# Patient Record
Sex: Male | Born: 1990
Health system: Southern US, Community
[De-identification: ages and names within clinical notes are randomized; demographics above are authoritative.]

## PROBLEM LIST (undated history)

## (undated) DIAGNOSIS — R569 Unspecified convulsions: Secondary | ICD-10-CM

## (undated) DIAGNOSIS — R51 Headache: Secondary | ICD-10-CM

## (undated) DIAGNOSIS — R519 Headache, unspecified: Secondary | ICD-10-CM

## (undated) DIAGNOSIS — I4892 Unspecified atrial flutter: Secondary | ICD-10-CM

## (undated) DIAGNOSIS — G809 Cerebral palsy, unspecified: Secondary | ICD-10-CM

## (undated) DIAGNOSIS — G930 Cerebral cysts: Secondary | ICD-10-CM

## (undated) DIAGNOSIS — G40909 Epilepsy, unspecified, not intractable, without status epilepticus: Secondary | ICD-10-CM

## (undated) HISTORY — DX: Headache, unspecified: R51.9

## (undated) HISTORY — PX: OTHER SURGICAL HISTORY: SHX169

## (undated) HISTORY — PX: TONSILLECTOMY AND ADENOIDECTOMY: SHX28

## (undated) HISTORY — DX: Headache: R51

---
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
## 2011-01-14 ENCOUNTER — Emergency Department (HOSPITAL_COMMUNITY)
Admission: EM | Admit: 2011-01-14 | Discharge: 2011-01-14 | Disposition: A | Discharge status: Home / Self Care | Payer: 59 | Attending: Emergency Medicine | Admitting: Emergency Medicine

## 2011-01-14 ENCOUNTER — Emergency Department (HOSPITAL_COMMUNITY): Payer: 59

## 2011-01-14 DIAGNOSIS — G809 Cerebral palsy, unspecified: Secondary | ICD-10-CM | POA: Insufficient documentation

## 2011-01-14 DIAGNOSIS — IMO0001 Reserved for inherently not codable concepts without codable children: Secondary | ICD-10-CM | POA: Insufficient documentation

## 2011-01-14 DIAGNOSIS — J029 Acute pharyngitis, unspecified: Secondary | ICD-10-CM | POA: Insufficient documentation

## 2011-01-14 DIAGNOSIS — R059 Cough, unspecified: Secondary | ICD-10-CM | POA: Insufficient documentation

## 2011-01-14 DIAGNOSIS — R05 Cough: Secondary | ICD-10-CM | POA: Insufficient documentation

## 2011-01-14 DIAGNOSIS — R569 Unspecified convulsions: Secondary | ICD-10-CM | POA: Insufficient documentation

## 2011-01-14 DIAGNOSIS — R6889 Other general symptoms and signs: Secondary | ICD-10-CM | POA: Insufficient documentation

## 2011-01-14 DIAGNOSIS — J069 Acute upper respiratory infection, unspecified: Secondary | ICD-10-CM | POA: Insufficient documentation

## 2011-01-14 LAB — RAPID STREP SCREEN (MED CTR MEBANE ONLY): Streptococcus, Group A Screen (Direct): NEGATIVE

## 2011-07-27 ENCOUNTER — Encounter: Payer: Self-pay | Admitting: Emergency Medicine

## 2011-07-27 ENCOUNTER — Emergency Department (HOSPITAL_COMMUNITY)
Admission: EM | Admit: 2011-07-27 | Discharge: 2011-07-27 | Disposition: A | Discharge status: Home / Self Care | Payer: 59 | Attending: Emergency Medicine | Admitting: Emergency Medicine

## 2011-07-27 DIAGNOSIS — Z79899 Other long term (current) drug therapy: Secondary | ICD-10-CM | POA: Insufficient documentation

## 2011-07-27 DIAGNOSIS — R569 Unspecified convulsions: Secondary | ICD-10-CM

## 2011-07-27 DIAGNOSIS — G40909 Epilepsy, unspecified, not intractable, without status epilepticus: Secondary | ICD-10-CM | POA: Insufficient documentation

## 2011-07-27 HISTORY — DX: Unspecified convulsions: R56.9

## 2011-07-27 LAB — URINALYSIS, ROUTINE W REFLEX MICROSCOPIC
Bilirubin Urine: NEGATIVE
Glucose, UA: NEGATIVE mg/dL
Specific Gravity, Urine: 1.018 (ref 1.005–1.030)
pH: 5 (ref 5.0–8.0)

## 2011-07-27 LAB — BASIC METABOLIC PANEL
BUN: 15 mg/dL (ref 6–23)
Chloride: 104 mEq/L (ref 96–112)
Glucose, Bld: 111 mg/dL — ABNORMAL HIGH (ref 70–99)
Potassium: 4.3 mEq/L (ref 3.5–5.1)

## 2011-07-27 LAB — CBC
HCT: 41.1 % (ref 39.0–52.0)
Hemoglobin: 14.7 g/dL (ref 13.0–17.0)
MCHC: 35.8 g/dL (ref 30.0–36.0)
WBC: 10.8 10*3/uL — ABNORMAL HIGH (ref 4.0–10.5)

## 2011-07-27 LAB — URINE MICROSCOPIC-ADD ON

## 2011-07-27 MED ORDER — ACETAMINOPHEN 325 MG PO TABS
650.0000 mg | ORAL_TABLET | Freq: Once | ORAL | Status: AC
  Administered 2011-07-27: 650 mg via ORAL
  Filled 2011-07-27: qty 2

## 2011-07-27 NOTE — ED Notes (Signed)
Per EMS pt had 2 witnessed seizures today per family. Pt alert to verbal stimuli, but falls back to sleep fast. Pt has hx of seizures. Pt was in bed when seizures took place. No signs of injury in mouth.

## 2011-07-27 NOTE — ED Provider Notes (Signed)
History     CSN: [id number] Arrival date & time: 07/27/2011  5:12 PM   First MD Initiated Contact with Patient 07/27/11 1719      Chief Complaint  Patient presents with  . Seizures    (Consider location/radiation/quality/duration/timing/severity/associated sxs/prior treatment) HPI History provided by pt's mother and girlfriend.   Patient's mother reports that he has h/o generalized tonic-clonic seizures for which he takes tegretol and keppra.  His girlfriend says he has been compliant w/ medication.  She witnessed a 5-58min seizure this afternoon.  Had another one w/in a 1/2 hr of first.  Has been post-ictal ever since which is typical for him.  No recent head trauma.  No recent illnesses.  Neurologist is located in [place] and his mother would like him to see a neurologist in [place].   Past Medical History  Diagnosis Date  . Seizures     History reviewed. No pertinent past surgical history.  History reviewed. No pertinent family history.  History  Substance Use Topics  . Smoking status: Not on file  . Smokeless tobacco: Not on file  . Alcohol Use: No      Review of Systems  All other systems reviewed and are negative.    Allergies  Review of patient's allergies indicates no known allergies.  Home Medications   Current Outpatient Rx  Name Route Sig Dispense Refill  . CARBAMAZEPINE ER 200 MG PO CP12 Oral Take 400 mg by mouth 2 (two) times daily.      Marland Kitchen LEVETIRACETAM 1000 MG PO TABS Oral Take 1,000 mg by mouth 2 (two) times daily.        BP 121/55  Pulse 95  Temp(Src) 98.3 F (36.8 C) (Oral)  Resp 20  SpO2 100%  Physical Exam  Nursing note and vitals reviewed. Constitutional: He appears well-developed and well-nourished. No distress.  HENT:  Head: Normocephalic and atraumatic.  Eyes:       Normal appearance  Neck: Normal range of motion.  Cardiovascular: Normal rate and regular rhythm.   Pulmonary/Chest: Effort normal and breath sounds normal.    Musculoskeletal: Normal range of motion.  Neurological: He is alert. He has normal reflexes. No cranial nerve deficit or sensory deficit. He displays a negative Romberg sign. Coordination and gait normal.       Drowsy.  Oriented to person and time. 5/5 and equal upper and lower extremity strength.  No past pointing.  No pronator drift.    Skin: Skin is warm and dry. No rash noted.  Psychiatric: He has a normal mood and affect. His behavior is normal.    ED Course  Procedures (including critical care time)  Labs Reviewed  CBC - Abnormal; Notable for the following:    WBC 10.8 (*)    All other components within normal limits  BASIC METABOLIC PANEL - Abnormal; Notable for the following:    Glucose, Bld 111 (*)    GFR calc non Af Amer 86 (*)    All other components within normal limits  URINALYSIS, ROUTINE W REFLEX MICROSCOPIC - Abnormal; Notable for the following:    Hgb urine dipstick SMALL (*)    Protein, ur 30 (*)    All other components within normal limits  URINE MICROSCOPIC-ADD ON - Abnormal; Notable for the following:    Bacteria, UA FEW (*)    All other components within normal limits  CARBAMAZEPINE LEVEL, TOTAL   No results found.   1. Seizure       MDM  Pt w/ h/o seizure disorder presents w/ seizure.  Compliant w/ meds.  Pt drowsy but no focal neuro deficits on exam.  Labs unremarkable w/ exception of tegretol level which is still pending and has not yet been transferred to [facility].  Pt more awake now and has no complaints.  Discharged home w/ referral to [facility].  Will contact him if Tegretol level abnormal.   Tegretol level low.  Flow manager has agreed to call pt to notify him and advise him to follow up with his neurologist asap.        Otilio Miu, [place] 07/28/11 1040

## 2011-07-27 NOTE — ED Notes (Signed)
ZOX:[id number]<AV> Expected date:07/27/11<BR> Expected time: 4:51 PM<BR> Means of arrival:Ambulance<BR> Comments:<BR> GC M62. 20 YO M. SZ. HX OF SAME. ALERT TO VERBAL RIGHT NOW. 10 MIN ETA.

## 2011-07-28 NOTE — ED Notes (Signed)
Per Kyung Bacca PA, contact patient regarding low Tegretol level 1.9 and have patient follow-up with current neurologist today. Three unsuccessful attempts to contact patient by phone today.

## 2011-07-30 NOTE — ED Provider Notes (Signed)
Medical screening examination/treatment/procedure(s) were conducted as a shared visit with non-physician practitioner(s) and myself.  I personally evaluated the patient during the encounter.  Patient has long-standing seizure disorder. Another seizure today. Tegretol level low.  Neuro exam normal  Donnetta Hutching, MD 07/30/11 1450

## 2011-09-22 ENCOUNTER — Other Ambulatory Visit: Payer: Self-pay

## 2011-09-22 ENCOUNTER — Inpatient Hospital Stay (HOSPITAL_COMMUNITY): Payer: 59

## 2011-09-22 ENCOUNTER — Encounter (HOSPITAL_COMMUNITY): Payer: Self-pay | Admitting: Nurse Practitioner

## 2011-09-22 ENCOUNTER — Inpatient Hospital Stay (HOSPITAL_COMMUNITY)
Admission: EM | Admit: 2011-09-22 | Discharge: 2011-09-24 | DRG: 310 | Disposition: A | Discharge status: Home / Self Care | Payer: 59 | Source: Ambulatory Visit | Attending: Internal Medicine | Admitting: Internal Medicine

## 2011-09-22 DIAGNOSIS — I4892 Unspecified atrial flutter: Principal | ICD-10-CM

## 2011-09-22 DIAGNOSIS — R569 Unspecified convulsions: Secondary | ICD-10-CM

## 2011-09-22 DIAGNOSIS — G40909 Epilepsy, unspecified, not intractable, without status epilepticus: Secondary | ICD-10-CM

## 2011-09-22 DIAGNOSIS — G930 Cerebral cysts: Secondary | ICD-10-CM | POA: Diagnosis present

## 2011-09-22 DIAGNOSIS — Z8679 Personal history of other diseases of the circulatory system: Secondary | ICD-10-CM | POA: Diagnosis present

## 2011-09-22 HISTORY — DX: Cerebral palsy, unspecified: G80.9

## 2011-09-22 HISTORY — DX: Cerebral cysts: G93.0

## 2011-09-22 HISTORY — DX: Unspecified atrial flutter: I48.92

## 2011-09-22 LAB — BASIC METABOLIC PANEL
CO2: 24 mEq/L (ref 19–32)
Calcium: 9.4 mg/dL (ref 8.4–10.5)
Chloride: 103 mEq/L (ref 96–112)
Glucose, Bld: 60 mg/dL — ABNORMAL LOW (ref 70–99)
Potassium: 4.3 mEq/L (ref 3.5–5.1)
Sodium: 137 mEq/L (ref 135–145)

## 2011-09-22 LAB — URINE MICROSCOPIC-ADD ON

## 2011-09-22 LAB — CBC
Hemoglobin: 15.2 g/dL (ref 13.0–17.0)
MCHC: 35.8 g/dL (ref 30.0–36.0)
RBC: 4.72 MIL/uL (ref 4.22–5.81)

## 2011-09-22 LAB — GLUCOSE, CAPILLARY
Glucose-Capillary: 102 mg/dL — ABNORMAL HIGH (ref 70–99)
Glucose-Capillary: 84 mg/dL (ref 70–99)
Glucose-Capillary: 95 mg/dL (ref 70–99)

## 2011-09-22 LAB — URINALYSIS, ROUTINE W REFLEX MICROSCOPIC
Glucose, UA: NEGATIVE mg/dL
Ketones, ur: 15 mg/dL — AB
Nitrite: NEGATIVE
Protein, ur: NEGATIVE mg/dL
pH: 6 (ref 5.0–8.0)

## 2011-09-22 LAB — TSH: TSH: 1.167 u[IU]/mL (ref 0.350–4.500)

## 2011-09-22 LAB — ETHANOL: Alcohol, Ethyl (B): <11 mg/dL (ref 0–11)

## 2011-09-22 LAB — DIFFERENTIAL
Basophils Absolute: 0 10*3/uL (ref 0.0–0.1)
Lymphocytes Relative: 9 % — ABNORMAL LOW (ref 12–46)
Lymphs Abs: 0.9 10*3/uL (ref 0.7–4.0)
Neutro Abs: 8.5 10*3/uL — ABNORMAL HIGH (ref 1.7–7.7)
Neutrophils Relative %: 82 % — ABNORMAL HIGH (ref 43–77)

## 2011-09-22 LAB — CARBAMAZEPINE LEVEL, TOTAL: Carbamazepine Lvl: <0.5 ug/mL — ABNORMAL LOW (ref 4.0–12.0)

## 2011-09-22 MED ORDER — ACETAMINOPHEN 325 MG PO TABS
650.0000 mg | ORAL_TABLET | Freq: Once | ORAL | Status: AC
  Administered 2011-09-22: 650 mg via ORAL
  Filled 2011-09-22: qty 2

## 2011-09-22 MED ORDER — IBUPROFEN 800 MG PO TABS
800.0000 mg | ORAL_TABLET | Freq: Once | ORAL | Status: DC
  Filled 2011-09-22: qty 1

## 2011-09-22 MED ORDER — INFLUENZA VIRUS VACC SPLIT PF IM SUSP
0.5000 mL | INTRAMUSCULAR | Status: AC
  Administered 2011-09-23: 0.5 mL via INTRAMUSCULAR
  Filled 2011-09-22: qty 0.5

## 2011-09-22 MED ORDER — LORAZEPAM 2 MG/ML IJ SOLN
1.0000 mg | Freq: Once | INTRAMUSCULAR | Status: AC
  Administered 2011-09-22: 1 mg via INTRAVENOUS
  Filled 2011-09-22: qty 1

## 2011-09-22 MED ORDER — LEVETIRACETAM 500 MG PO TABS
1000.0000 mg | ORAL_TABLET | ORAL | Status: AC
  Administered 2011-09-22: 1000 mg via ORAL
  Filled 2011-09-22: qty 2

## 2011-09-22 MED ORDER — OXYCODONE-ACETAMINOPHEN 5-325 MG PO TABS: 1.0000 | ORAL_TABLET | Freq: Once | ORAL | Status: DC

## 2011-09-22 MED ORDER — CARBAMAZEPINE ER 200 MG PO TB12
400.0000 mg | ORAL_TABLET | Freq: Two times a day (BID) | ORAL | Status: DC
  Administered 2011-09-22 – 2011-09-24 (×4): 400 mg via ORAL
  Filled 2011-09-22 (×7): qty 2

## 2011-09-22 MED ORDER — LEVETIRACETAM 500 MG PO TABS
1000.0000 mg | ORAL_TABLET | Freq: Two times a day (BID) | ORAL | Status: DC
  Administered 2011-09-22 – 2011-09-24 (×4): 1000 mg via ORAL
  Filled 2011-09-22 (×6): qty 2

## 2011-09-22 MED ORDER — DILTIAZEM HCL 25 MG/5ML IV SOLN: 10.0000 mg | Freq: Once | INTRAVENOUS | Status: DC

## 2011-09-22 MED ORDER — ACETAMINOPHEN 325 MG PO TABS: 650.0000 mg | ORAL_TABLET | Freq: Four times a day (QID) | ORAL | Status: DC | PRN

## 2011-09-22 MED ORDER — SODIUM CHLORIDE 0.9 % IJ SOLN
3.0000 mL | Freq: Two times a day (BID) | INTRAMUSCULAR | Status: DC
  Administered 2011-09-22: 3 mL via INTRAVENOUS

## 2011-09-22 MED ORDER — HEPARIN SODIUM (PORCINE) 5000 UNIT/ML IJ SOLN
5000.0000 [IU] | Freq: Three times a day (TID) | INTRAMUSCULAR | Status: DC
  Administered 2011-09-22 – 2011-09-23 (×3): 5000 [IU] via SUBCUTANEOUS
  Filled 2011-09-22 (×10): qty 1

## 2011-09-22 MED ORDER — DEXTROSE 50 % IV SOLN: 25.0000 mL | INTRAVENOUS | Status: AC

## 2011-09-22 MED ORDER — DILTIAZEM HCL 100 MG IV SOLR
5.0000 mg/h | Freq: Once | INTRAVENOUS | Status: DC
  Filled 2011-09-22: qty 100

## 2011-09-22 MED ORDER — CARBAMAZEPINE 200 MG PO TABS
200.0000 mg | ORAL_TABLET | ORAL | Status: AC
  Administered 2011-09-22: 200 mg via ORAL
  Filled 2011-09-22: qty 1

## 2011-09-22 NOTE — ED Notes (Signed)
Admitting at bedside 

## 2011-09-22 NOTE — ED Notes (Signed)
Per ems: pt was asleep in bed and family heard noise went and witnessed a seizure, tonic clonic, lasting approx 5 minutes. On arrival to scene ems found pt postictal, no trauma or incontinence noted. On arrival to ED A&Ox4. Only c/o fatigue. Family reports more seizures recently

## 2011-09-22 NOTE — ED Notes (Signed)
CBG 84 at 17:43

## 2011-09-22 NOTE — ED Notes (Signed)
Patient remains on monitor and oxygen saturation of 98% on RA. Patient resting while watching TV with family at bedside. NAD at this time.

## 2011-09-22 NOTE — Consult Note (Signed)
CARDIOLOGY CONSULT NOTE   Patient ID: Ricky Humphrey MRN: [id number] DOB/AGE: 20-06-1991 20 y.o.  Admit date: 09/22/2011  Primary Physician   No primary provider on file. Primary Cardiologist   New to Ricky Miss, MD Reason for Consultation   atrial flutter  WUJ:[id number] Ricky Humphrey is a 21 y.o. male with a history of cerebral palsy and history of seizures. He has a history of recurrent seizures. These typically occur when he runs out of his medication. He called his neurologist about a week ago to inform them that he needed refills. Over a week went by and  he did not receive his medication. He had a seizure and was brought to the emergency room today. He was found to have atrial flutter. The atrial flutter lasted for several hours. It resolved before he arrived on the floor. He's been in normal sinus rhythm since arriving on unit 2000.  He denied any chest pain or shortness of breath. The atrial flutter rate was well-controlled.   He denies any previous cardiac problems. He's not aware of any other cardiac etiologies. He denies any chest pain or shortness of breath. He is very active. He has a history of cerebral palsy and has problems with fine motor skills but is able to jog in several miles a day.  He is a Consulting civil engineer at [employer].   Past Medical History  Diagnosis Date  . Seizures    cerebral palsy  History reviewed. No pertinent past surgical history.  No Known Allergies     No current facility-administered medications on file prior to encounter.   Current Outpatient Prescriptions on File Prior to Encounter  Medication Sig Dispense Refill  . carbamazepine (CARBATROL) 200 MG 12 hr capsule Take 400 mg by mouth 2 (two) times daily.        Marland Kitchen levETIRAcetam (KEPPRA) 1000 MG tablet Take 1,000 mg by mouth 2 (two) times daily.          History   Social History  . Marital Status: Single    Spouse Name: N/A    Number of Children: N/A  . Years of Education: N/A    Occupational History  . Not on file.   Social History Main Topics  . Smoking status: Never Smoker   . Smokeless tobacco: Not on file  . Alcohol Use: No  . Drug Use: No  . Sexually Active:    Other Topics Concern  . Not on file   Social History Narrative  . No narrative on file     History reviewed. No pertinent family history.   ROS:  Full 14 point review of systems complete and found to be negative unless listed  above  Physical Exam: Blood pressure 108/60, pulse 71, temperature 98.3 F (36.8 C), temperature source Oral, resp. rate 18, height 6\' 2"  (1.88 m), weight 205 lb (92.987 kg), SpO2 97.00%.   General: Well developed, well nourished, in no acute distress Head: Eyes PERRLA, No xanthomas.   Normocephalic and atraumatic, oropharynx without edema or exudate. Dentition Lungs: Clear bilaterally to auscultation  Heart: HRRR S1 S2, no rub/gallop, Heart irregular rate and rhythm with S1, S2  murmur. pulses are 2+ & equal all 4 extrem.   Neck: No carotid bruit. No lymphadenopathy.  JVD. Abdomen: Bowel sounds present, abdomen soft and non-tender without masses or hernias noted. Msk:  No spine or cva tenderness. Normal strength and tone for age, no joint deformities or effusions. Extremities: No clubbing or cyanosis.  edema.  Neuro: Alert  and oriented X 3. No focal deficits noted. Psych:  Good affect, responds appropriately Skin: No rashes or lesions noted.  Labs:   Lab Results  Component Value Date   WBC 10.4 09/22/2011   HGB 15.2 09/22/2011   HCT 42.4 09/22/2011   MCV 89.8 09/22/2011   PLT 270 09/22/2011   No results found for this basename: INR in the last 72 hours  Lab 09/22/11 1319  NA 137  K 4.3  CL 103  CO2 24  BUN 9  CREATININE 1.32  CALCIUM 9.4  PROT --  BILITOT --  ALKPHOS --  ALT --  AST --  GLUCOSE 60*    Echo: Not performed yet  Radiology:  No results found.  EKG:  Normal sinus rhythm. He has no ST or T wave changes. His EKG from the  emergency room reveals atrial flutter with a 41 AV block.  ASSESSMENT AND PLAN:     1. Atrial flutter:  the patient developed atrial flutter presumably today because of his seizure. He's never had any episodes of atrial flutter in the past that he is aware of. He's had many seizures. He does not have any other cardiac risk factors.  We'll get an echocardiogram tomorrow. I would recommend that he take an aspirin-  325 mg a day. Assuming that his echo is normal, I do not think that he'll need any other medications or followup at this point.    Ricky Humphrey, Ricky Humphrey., MD, [facility] 09/22/2011, 6:43 PM

## 2011-09-22 NOTE — ED Notes (Signed)
Patient resting ans talking with family. Patient remains on monitor and oxygen saturation of 98% on RA. NAD at this time.

## 2011-09-22 NOTE — Progress Notes (Signed)
1. Seizure: unclear if it was generalized or partial seizure because history was not clear.  Differential diagnosis include: electrolyte disturbances since patient does have glucose of 60, or brain tumor (patient has hx of arachnoid cyst s/p drainage in 2007), vs. Medications vs. illicit drugs/alcohol withdrawal.  Patient has not been taking his Tegretol in the past 2 days and his Carbamazepine was low <0.5.   Plan: -Admit to SDU with seizure precaution -Get CT head w/o contrast given his hx of arachnoid cyst -Correct hypoglycemia with D50 -Repeat BMP in AM and monitor CBGs -Faxed MR release form to his previous neurologist, awaiting for results -Continue Keppra and Tegretol -UDS  2.  A-flutter: new onset & paroxysmal, mother also reports history of 2 cardiac events and was unable/refused to complete a stress echocardiogram.  Not sure if 2-D echocardiogram was done in the past. -Start Cardizem gtt -Consulted cardiology -Monitor on telemetry -2-D echocardiogram -Will discuss with cardiology/Neurology about long-term anticoagulation  3. Hx of ATN: 2/2 to NSAIDs.  Baseline Cr was 1.2-1.4.  Stable today at 1.32, will continue to monitor with BMP  DVT ppx: Heparin 5000u SQ TID

## 2011-09-22 NOTE — ED Notes (Signed)
Patietn placed on zoll and taken to 2023. Kennyth Arnold, RN is the receiving nurse.

## 2011-09-22 NOTE — ED Notes (Signed)
Per Katie hold cardizem for now, patient has converted to NSR at this time.

## 2011-09-22 NOTE — ED Provider Notes (Signed)
History     CSN: [id number]  Arrival date & time 09/22/11  1237   First MD Initiated Contact with Patient 09/22/11 1317      Chief Complaint  Patient presents with  . Seizures    (Consider location/radiation/quality/duration/timing/severity/associated sxs/prior treatment) HPI History provided by patient, EMS and prior chart.  Per EMS, pt had a generalized tonic-clonic seizure that lasted approx 5 minutes while in bed this morning.  Pt lives with his girlfriend so she was likely the family member that witnessed.  Pt reports that he ran out of his Tegretol and Keppra 2 days ago.  He has not had any recent illnesses including fever, cough, N/V/D or urinary sx.  No recent head trauma.  He currently c/o pain in his mid-back and headache.  Per prior chart, pt was most recently seen in ED for seizures on 07/27/11.    Past Medical History  Diagnosis Date  . Seizures     History reviewed. No pertinent past surgical history.  History reviewed. No pertinent family history.  History  Substance Use Topics  . Smoking status: Never Smoker   . Smokeless tobacco: Not on file  . Alcohol Use: No      Review of Systems  All other systems reviewed and are negative.    Allergies  Review of patient's allergies indicates no known allergies.  Home Medications   Current Outpatient Rx  Name Route Sig Dispense Refill  . CARBAMAZEPINE ER 200 MG PO CP12 Oral Take 400 mg by mouth 2 (two) times daily.      Marland Kitchen LEVETIRACETAM 1000 MG PO TABS Oral Take 1,000 mg by mouth 2 (two) times daily.        BP 101/41  Pulse 104  Temp(Src) 98.8 F (37.1 C) (Oral)  Resp 20  Ht 6\' 2"  (1.88 m)  Wt 205 lb (92.987 kg)  BMI 26.32 kg/m2  SpO2 96%  Physical Exam  Nursing note and vitals reviewed. Constitutional: He is oriented to person, place, and time. He appears well-developed and well-nourished. No distress.  HENT:  Head: Normocephalic and atraumatic.  Eyes:       Normal appearance  Neck: Normal  range of motion.  Cardiovascular: Normal rate.        Irregular rhythm  Pulmonary/Chest: Effort normal and breath sounds normal.  Abdominal:       Mild, diffuse ttp  Musculoskeletal: Normal range of motion.       Mild tenderness thoracic spine and paraspinals  Neurological: He is alert and oriented to person, place, and time. No cranial nerve deficit.       Drowsy.  No sensory deficits.  5/5 extremity strength.  Skin: Skin is warm and dry. No rash noted.  Psychiatric: He has a normal mood and affect. His behavior is normal.    ED Course  Procedures (including critical care time)   Date: 09/22/2011  Rate: 80  Rhythm: atrial flutter  QRS Axis: normal  Intervals: normal  ST/T Wave abnormalities: nonspecific ST changes (likely early repol- pt w/out CP/fever)  Conduction Disutrbances:none  Narrative Interpretation:   Old EKG Reviewed: changes noted (flutter is new)   Labs Reviewed  DIFFERENTIAL - Abnormal; Notable for the following:    Neutrophils Relative 82 (*)    Neutro Abs 8.5 (*)    Lymphocytes Relative 9 (*)    All other components within normal limits  BASIC METABOLIC PANEL - Abnormal; Notable for the following:    Glucose, Bld 60 (*)  GFR calc non Af Amer 77 (*)    GFR calc Af Amer 89 (*)    All other components within normal limits  CARBAMAZEPINE LEVEL, TOTAL - Abnormal; Notable for the following:    Carbamazepine Lvl <0.5 (*)    All other components within normal limits  CBC   No results found.   1. Atrial flutter   2. Seizure disorder       MDM  Pt has h/o seizure disorder and has been off of Keppra and Tegretol for the past 2 days.  Had what sounds to be a typical generalized tonic-clonic seizure today.  Exam sig for drowsiness and irregular heart rhythm.  No focal neuro deficits.  EKG shows atrial flutter.  No past history.  Labs unremarkable w/ exception of low tegretol level.  Pt received IV ativan and po Tegretol and Keppra.  Teaching service  consulted for admission for new onset flutter and [facility] has agreed to see pt in consult.    Pt is more alert than on initial exam.  VSS.         Otilio Miu, PA 09/22/11 [address] [place], [place] 09/22/11 1623

## 2011-09-22 NOTE — ED Notes (Signed)
First meeting with patient. Patient sleeping at this time. Family at bedside. Patient remains on monitor and oxygen saturation of 98% on RA.

## 2011-09-22 NOTE — H&P (Signed)
Hospital Admission Note Date: 09/22/2011  Patient name: Ricky Humphrey Medical record number: [id number] Date of birth: October 26, 1990 Age: 21 y.o. Gender: male Neurology: Dr.  Monia Sabal [phone])  Medical Service:          Internal Medicine Teaching Service    Attending physician:  Dr. Margarito Liner   1st Contact:  Wylene Men, MS-IV       Pager: [phone] 2nd Contact:  Dr. Carrolyn Meiers           Pager: [phone] After 5 pm or weekends: 1st Contact:      Pager: [phone] 2nd Contact:      Pager: [phone]  Chief Complaint:  Chief Complaint  Patient presents with  . Seizures     History of Present Illness: Ricky Humphrey is a 21 y.o.male with past medical history significant for a drained arachnoid cyst (2007) who presents after a tonic-clonic seizure of approximately 2 minute duration.    Ricky Humphrey states that he was working on his computer and lost consciousness in the morning of admission. According to his girl friend's sister, the patient was unconscious for about 2 minutes, however was not available to provide a description of the event.  Per the patient, the episode felt like past episodes of tonic clonic seizures where his right arm goes numb and goes unconscious before being able to react.The patient was likely down for about 2 minutes. Afterwards the patient had a headache, felt weak, and had diffuse pain. He denies any tongue biting, fecal incontinence, or urinary incontinence. Noteably, the patient is currently being treated for epilepsy with Tegretol and Keppra. However, the patient has not taken is Tegretol for the past 2 days because he was unable to obtain it. Before the event the patient was working on his computer all day. He denies physical exertion. He denies regular alcohol, caffeine, or any other stimulant use. 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 Per patient it was discovered that he had a  arachnoid cyst, that was  subsequently drained in 2007. The patient's last seizure was in December. He says he has highly variable seizure frequency.   In the emergency department the patient was given Ativan, Keppra, Tegretol, and Tylenol. Telemetry noted runs of tachycardia and atrial flutter. The patient notes a history of chest pain 3 years ago the patient was exerting himself and noted an episode of severe chest pain described as a "lasso around his chest" associated with diaphoresis and shortness of breath. Subsequently, he was scheduled for a cardiac stress test, however, the patient declined to participate. Subsequently, the patient has not had similar chest pain, but says that he avoids physical exertion, for fear of the chest pain. The patient denies any family history of sudden cardiac death, and denies any family history of structural heart disease.   Review of Systems: Constitutional: Denies fever, chills, diaphoresis, appetite change and fatigue.  HEENT: + photophobia, denies redness, ear pain, congestion, sore throat, rhinorrhea, sneezing, neck pain, neck stiffness.   Respiratory: Denies SOB, DOE, cough, chest tightness,  and wheezing.   Cardiovascular: Denies chest pain, palpitations and leg swelling.  Gastrointestinal: Denies nausea, vomiting, abdominal pain, diarrhea, constipation, blood in stool and abdominal distention.  Genitourinary: Denies dysuria, urgency, frequency, hematuria, flank pain and difficulty urinating.  Musculoskeletal: Denies back pain, joint swelling, arthralgias and gait problem.  Skin: Denies pallor, rash and wound.  Neurological: Denies dizziness, seizures, syncope, weakness, light-headedness, numbness and headaches.  Hematological: Denies adenopathy. Easy  bruising, personal or family bleeding history  Psychiatric/Behavioral: Denies suicidal ideation, mood changes, confusion, nervousness, sleep disturbance and agitation  Past Medical History  Diagnosis Date  . Tonic Clonic  Seizure arachnoid Cyst (drained in 2007) Hx of Cerebral Palsy Acute Tubular Necrosis secondary to NSAIDs  2007 2007   Past Family History:  Denies any heart disease or hypertrophic cardiomyopathy  Past Surgical History: Craniotomy to drain arachnoid Cyst - 2007  Social History: Drinks alcohol occasionally, denies any smoking and illicit drugs.  He is a Archivist.  Allergies: Mother says patient had Acute Tubular Necrosis after NSAIDs in the past.   Meds: Medications Prior to Admission  Medication Dose Route Frequency Provider Last Rate Last Dose  . acetaminophen (TYLENOL) tablet 650 mg  650 mg Oral Once Gerhard Munch, MD   650 mg at 09/22/11 1620  . carbamazepine (TEGRETOL) tablet 200 mg  200 mg Oral To Major Otilio Miu, PA   200 mg at 09/22/11 1608  . diltiazem (CARDIZEM) 100 mg in dextrose 5 % 100 mL infusion  5-15 mg/hr Intravenous Once [employer], PA      . diltiazem (CARDIZEM) injection 10 mg  10 mg Intravenous Once [employer], PA      . levETIRAcetam (KEPPRA) tablet 1,000 mg  1,000 mg Oral To Major Otilio Miu, PA   1,000 mg at 09/22/11 1609  . LORazepam (ATIVAN) injection 1 mg  1 mg Intravenous Once Otilio Miu, PA   1 mg at 09/22/11 1510  . DISCONTD: ibuprofen (ADVIL,MOTRIN) tablet 800 mg  800 mg Oral Once Otilio Miu, PA      . DISCONTD: oxyCODONE-acetaminophen (PERCOCET) 5-325 MG per tablet 1 tablet  1 tablet Oral Once Otilio Miu, PA       Medications Prior to Admission  Medication Sig Dispense Refill  . carbamazepine (CARBATROL) 200 MG 12 hr capsule Take 400 mg by mouth 2 (two) times daily.        Marland Kitchen levETIRAcetam (KEPPRA) 1000 MG tablet Take 1,000 mg by mouth 2 (two) times daily.              Physical Exam: Blood pressure 143/94, pulse 85, temperature 98.3 F (36.8 C), temperature source Oral, resp. rate 21, height 6\' 2"  (1.88 m), weight 92.987 kg (205 lb), SpO2 98.00%. Gen:  Well-developed, well-nourished male  in no acute distress; alert, appropriate and cooperative throughout examination. Head: Normocephalic, atraumatic. Eyes: PERRL, EOMI, No signs of anemia or jaundince. Mild Photophobia. Nose: Mucous membranes moist, not inflammed, nonerythematous. Throat: Oropharynx nonerythematous, no exudate appreciated.  Neck: Supple with no deformities, masses, or tenderness noted.  No carotid Bruits, no JVD. Lungs: Normal respiratory effort. Clear to auscultation BL, without crackles or wheezes. Heart: mild tachycardia. S1 and S2 normal without  murmur, gallop,or rubs. Abdomen: BS normoactive. Soft, nondistended, non-tender. No masses or organomegaly. Extremities: No pretibial edema. Neurologic: A&O X3, CN II - XII are grossly intact. Motor strength is 5/5 in the all 4 extremities, Sensations intact to light touch. No focal neurologic deficit Skin: No visible rashes, scars. Psych: mood and affect are normal.    Lab results: Basic Metabolic Panel:  Basename 09/22/11 1319  NA 137  K 4.3  CL 103  CO2 24  GLUCOSE 60*  BUN 9  CREATININE 1.32  CALCIUM 9.4  MG --  PHOS --   Liver Function Tests: Pending  CBC:  Basename 09/22/11 1319  WBC 10.4  NEUTROABS 8.5*  HGB 15.2  HCT 42.4  MCV 89.8  PLT 270    UA,TSH, UDS pending   Alcohol Level: Pending  Urinalysis: Pending  Imaging results:  CXR: 2 views pending  Other results: EKG: 09/22/2011: Atrial Flutter with rate of 80  Assessment & Plan by Problem: Ricky Humphrey is a 21 year old gentleman with a PMH significant for epilepsy who presents after a likely seizure. The patient is also noted to be intermittently in atrial flutter.   1. Seizure: unclear if it was generalized or partial seizure because history was not clear. Differential diagnosis include: electrolyte disturbances since patient does have glucose of 60, or brain tumor (patient has hx of arachnoid cyst s/p drainage in 2007), vs.  Medications vs. illicit drugs/alcohol withdrawal. Patient has not been taking his Tegretol in the past 2 days and his Carbamazepine was low <0.5.  Plan:  -Admit to SDU with seizure precaution  -Get CT head w/o contrast given his hx of arachnoid cyst  -Correct hypoglycemia with D50  -Repeat BMP in AM and monitor CBGs  -Faxed MR release form to his previous neurologist, awaiting for results  -Continue Keppra and Tegretol  -UDS   2. A-flutter: new onset & paroxysmal, mother also reports history of 2 cardiac events and was unable/refused to complete a stress echocardiogram. Not sure if 2-D echocardiogram was done in the past.  -Start Cardizem gtt  -Consulted cardiology  -Monitor on telemetry  -2-D echocardiogram, 2 views chest Xray -Will discuss with cardiology/Neurology about long-term anticoagulation   3. Hx of ATN: 2/2 to NSAIDs. Baseline Cr was 1.2-1.4. Stable today at 1.32, will continue to monitor with BMP   DVT ppx: Heparin 5000u SQ TID     Code: Full   Signed: Carrolyn Meiers, PGY-II 09/22/2011, 5:08 PM

## 2011-09-23 ENCOUNTER — Encounter (HOSPITAL_COMMUNITY): Payer: Self-pay | Admitting: Internal Medicine

## 2011-09-23 DIAGNOSIS — R569 Unspecified convulsions: Secondary | ICD-10-CM | POA: Diagnosis present

## 2011-09-23 DIAGNOSIS — Z8679 Personal history of other diseases of the circulatory system: Secondary | ICD-10-CM | POA: Diagnosis present

## 2011-09-23 DIAGNOSIS — I369 Nonrheumatic tricuspid valve disorder, unspecified: Secondary | ICD-10-CM

## 2011-09-23 LAB — RAPID URINE DRUG SCREEN, HOSP PERFORMED
Cocaine: NOT DETECTED
Opiates: NOT DETECTED
Tetrahydrocannabinol: NOT DETECTED

## 2011-09-23 LAB — GLUCOSE, CAPILLARY: Glucose-Capillary: 85 mg/dL (ref 70–99)

## 2011-09-23 LAB — COMPREHENSIVE METABOLIC PANEL
Albumin: 3.2 g/dL — ABNORMAL LOW (ref 3.5–5.2)
BUN: 14 mg/dL (ref 6–23)
Creatinine, Ser: 1.22 mg/dL (ref 0.50–1.35)
GFR calc Af Amer: >90 mL/min (ref >90)
Glucose, Bld: 84 mg/dL (ref 70–99)
Total Protein: 6.6 g/dL (ref 6.0–8.3)

## 2011-09-23 LAB — CBC
HCT: 40.8 % (ref 39.0–52.0)
Hemoglobin: 14.3 g/dL (ref 13.0–17.0)
MCH: 31.5 pg (ref 26.0–34.0)
MCHC: 35 g/dL (ref 30.0–36.0)
MCV: 89.9 fL (ref 78.0–100.0)
RDW: 12.1 % (ref 11.5–15.5)

## 2011-09-23 NOTE — H&P (Addendum)
Internal Medicine Attending Admission Note Date: 09/23/2011  Patient name: Vontrell Pullman Medical record number: [id number] Date of birth: 09/09/1990 Age: 21 y.o. Gender: male  I saw and evaluated the patient. I reviewed the resident's note and I agree with the resident's findings and plan as documented in the resident's note, with additional comments as noted below.  Chief Complaint(s): Seizure  History - key components related to admission: Patient is a 21 year old male with a history of seizures, and arachnoid cyst status post drainage reportedly in 2007, brought to the ED after a seizure which occurred when he had been out of his medications for about 2 days; he was noted to be in atrial flutter in the emergency department and was admitted for further management and evaluation.  Patient denies a prior history of heart problems; however his mother reports that he had an episode in November of last year when he became profoundly diaphoretic and complained of a bandlike chest tightness after riding his bike.  She says he was referred to [facility] for evaluation, and that they attempted a stress study but that he was unwilling to exercise to a stress level because he was concerned about recurrent symptoms.  At that time she reports that a chemical stress study was discussed, but he did not return for the study.  She also reports a similar episode about 2 years ago.  She reports that he is followed at [facility]; she also reports a seizure that occurred in December of 2012 while he was on his medication which she feels was a breakthrough seizure.  Patient denies chest pain, palpitations, dyspnea, or diaphoresis associated with events preceding current admission.   Physical Exam - key components related to admission:  Filed Vitals:   09/23/11 0200 09/23/11 0400 09/23/11 0600 09/23/11 0814  BP: 124/69 111/51 105/63 108/59  Pulse: 71 64  59  Temp:  97.7 F (36.5 C)  97.6 F  (36.4 C)  TempSrc:  Oral  Oral  Resp:      Height:      Weight:      SpO2: 97% 98%  98%    General: Alert, no distress Lungs: Clear Heart: Regular; S1, S2, no S3, no S4, no murmurs; abdomen: Bowel sounds positive, soft, nontender Extremities: No edema  Lab results:   Basic Metabolic Panel:  Basename 09/23/11 0525 09/22/11 1319  NA 137 137  K 3.7 4.3  CL 103 103  CO2 26 24  GLUCOSE 84 60*  BUN 14 9  CREATININE 1.22 1.32  CALCIUM 8.9 9.4  MG -- --  PHOS -- --   Liver Function Tests:  [facility] 09/23/11 0525  AST 19  ALT 22  ALKPHOS 77  BILITOT 0.2*  PROT 6.6  ALBUMIN 3.2*    CBC:  Basename 09/23/11 0525 09/22/11 1319  WBC 9.9 10.4  NEUTROABS -- 8.5*  HGB 14.3 15.2  HCT 40.8 42.4  MCV 89.9 89.8  PLT 278 270    CBG:  Basename 09/23/11 0619 09/22/11 2330 09/22/11 2043 09/22/11 1741  GLUCAP 82 102* 95 84    Thyroid Function Tests:  Basename 09/22/11 1824  TSH 1.167    Alcohol Level:  Basename 09/22/11 1946  ETH <11    Misc. Labs: Carbamazepine Lvl  <0.5 (L)    Imaging results:  Dg Chest 2 View  09/22/2011  *RADIOLOGY REPORT*  Clinical Data: Seizure.  CHEST - 2 VIEW  Comparison: 01/14/2011.  Findings:  Cardiopericardial silhouette within normal limits. Mediastinal contours normal.  Trachea midline.  No airspace disease or effusion.  IMPRESSION: No active cardiopulmonary disease.  No interval change.  Original Report Authenticated By: Andreas Newport, M.D.   Ct Head Wo Contrast  09/22/2011  *RADIOLOGY REPORT*  Clinical Data: Seizure, left side head and neck pain, history of arachnoid cyst  CT HEAD WITHOUT CONTRAST  Technique:  Contiguous axial images were obtained from the base of the skull through the vertex without contrast.  Comparison: None  Findings: Large extra-axial CSF attenuation fluid collection identified laterally in left parietal region, 7.1 x 3.8 cm image 24 extending 5.2 cm cranial-caudal. Lesion is compatible with the patient's  history of arachnoid cyst. Few linear areas of slightly higher attenuation are seen within the anterior aspect of the collection, somewhat atypical for arachnoid cyst, question related to prior surgery at this site or septation. Normal ventricular morphology. No midline shift or mass effect. Remaining brain parenchyma normal appearance. No intracranial hemorrhage or evidence of infarction. No extra-axial fluid collection. Bones and sinuses otherwise unremarkable.  IMPRESSION: Prior left temporal craniotomy. Large CSF attenuation collection extra-axial in left parietal region, consistent with arachnoid cyst. Few linear areas of slightly higher attenuation are seen within the anterior aspect of this collection, question septations, uncertain etiology; consider either comparison to prior outside exams to assess stability or further evaluation by MR imaging with and without contrast to assess.  Original Report Authenticated By: Lollie Marrow, M.D.    Other results: EKG: Atrial flutter; ST elevation in inferior and lateral leads; premature complexes Telemetry: Normal sinus rhythm  Assessment & Plan by Problem: 1.  Atrial flutter.  Patient is now in normal sinus rhythm; cardiology feels that the rhythm was likely precipitated by his seizure.  A 2-D echocardiogram is pending.  Plan is monitor; mobilize out of bed; await results of echocardiogram; would discuss with Dr. Johney Frame the prior episodes of diaphoresis and chest tightness reported by patient's mother, the question being whether a stress study is warranted.  Would also get copies of records from [facility] where patient was previously seen.  2.  Seizure.  Patient has a history of seizures, and had been off of his medications for least 2 days; this was confirmed by a subtherapeutic carbamazepine level.  Head CT findings as noted above showed a large CSF attenuation collection extra-axial in left parietal region, consistent with  arachnoid cyst, and raised question of whether MRI is needed to assess possible septations in the fluid collection.  Would would ask neurology to see him since he is followed by [facility]; they may have prior imaging studies for comparison.  Continue his home anti-seizure medications.    Addendum: Patient's mother's cell phone number is [phone].

## 2011-09-23 NOTE — Progress Notes (Signed)
  Echocardiogram 2D Echocardiogram has been performed.  Dasean, Brow 09/23/2011, 1:17 PM

## 2011-09-23 NOTE — Progress Notes (Addendum)
Subjective: No acute events overnight.  No sz like activity.  No CP/SOB/dizziness/palpitations.  Eating well.    Objective: Vital signs in last 24 hours: Filed Vitals:   09/23/11 0200 09/23/11 0400 09/23/11 0600 09/23/11 0814  BP: 124/69 111/51 105/63 108/59  Pulse: 71 64  59  Temp:  97.7 F (36.5 C)  97.6 F (36.4 C)  TempSrc:  Oral  Oral  Resp:      Height:      Weight:      SpO2: 97% 98%  98%   Weight change:   Intake/Output Summary (Last 24 hours) at 09/23/11 0918 Last data filed at 09/23/11 0000  Gross per 24 hour  Intake    620 ml  Output    225 ml  Net    395 ml   Physical Exam: General: resting in bed, no acute distress, cooperative to exam HEENT: PERRL, EOMI, no scleral icterus, no conjunctival pallor Cardiac: RRR, no rubs, murmurs or gallops Pulm: clear to auscultation bilaterally, moving normal volumes of air Abd: soft, nontender, nondistended, BS normoactive Ext: warm and well perfused, no pedal edema Neuro: alert and oriented X3, non focal exam  Lab Results: Basic Metabolic Panel:  Lab 09/23/11 0454 09/22/11 1319  NA 137 137  K 3.7 4.3  CL 103 103  CO2 26 24  GLUCOSE 84 60*  BUN 14 9  CREATININE 1.22 1.32  CALCIUM 8.9 9.4  MG -- --  PHOS -- --   Liver Function Tests:  Lab 09/23/11 0525  AST 19  ALT 22  ALKPHOS 77  BILITOT 0.2*  PROT 6.6  ALBUMIN 3.2*   CBC:  Lab 09/23/11 0525 09/22/11 1319  WBC 9.9 10.4  NEUTROABS -- 8.5*  HGB 14.3 15.2  HCT 40.8 42.4  MCV 89.9 89.8  PLT 278 270   CBG:  Lab 09/23/11 0619 09/22/11 2330 09/22/11 2043 09/22/11 1741  GLUCAP 82 102* 95 84   Thyroid Function Tests:  Lab 09/22/11 1824  TSH 1.167  T4TOTAL --  FREET4 --  T3FREE --  THYROIDAB --   Urine Drug Screen: Drugs of Abuse     Component Value Date/Time   LABOPIA NONE DETECTED 09/22/2011 2328   COCAINSCRNUR NONE DETECTED 09/22/2011 2328   LABBENZ NONE DETECTED 09/22/2011 2328   AMPHETMU NONE DETECTED 09/22/2011 2328   THCU NONE  DETECTED 09/22/2011 2328   LABBARB NONE DETECTED 09/22/2011 2328    Alcohol Level:  Lab 09/22/11 1946  ETH <11   Micro Results: Recent Results (from the past 240 hour(s))  MRSA PCR SCREENING     Status: Normal   Collection Time   09/22/11  7:13 PM      Component Value Range Status Comment   MRSA by PCR NEGATIVE  NEGATIVE  Final    Studies/Results: Dg Chest 2 View  09/22/2011  *RADIOLOGY REPORT*  Clinical Data: Seizure.  CHEST - 2 VIEW  Comparison: 01/14/2011.  Findings:  Cardiopericardial silhouette within normal limits. Mediastinal contours normal. Trachea midline.  No airspace disease or effusion.  IMPRESSION: No active cardiopulmonary disease.  No interval change.  Original Report Authenticated By: Andreas Newport, M.D.   Ct Head Wo Contrast  09/22/2011  *RADIOLOGY REPORT*  Clinical Data: Seizure, left side head and neck pain, history of arachnoid cyst  CT HEAD WITHOUT CONTRAST  Technique:  Contiguous axial images were obtained from the base of the skull through the vertex without contrast.  Comparison: None  Findings: Large extra-axial CSF attenuation fluid collection identified laterally  in left parietal region, 7.1 x 3.8 cm image 24 extending 5.2 cm cranial-caudal. Lesion is compatible with the patient's history of arachnoid cyst. Few linear areas of slightly higher attenuation are seen within the anterior aspect of the collection, somewhat atypical for arachnoid cyst, question related to prior surgery at this site or septation. Normal ventricular morphology. No midline shift or mass effect. Remaining brain parenchyma normal appearance. No intracranial hemorrhage or evidence of infarction. No extra-axial fluid collection. Bones and sinuses otherwise unremarkable.  IMPRESSION: Prior left temporal craniotomy. Large CSF attenuation collection extra-axial in left parietal region, consistent with arachnoid cyst. Few linear areas of slightly higher attenuation are seen within the anterior aspect of  this collection, question septations, uncertain etiology; consider either comparison to prior outside exams to assess stability or further evaluation by MR imaging with and without contrast to assess.  Original Report Authenticated By: Lollie Marrow, M.D.   Medications: I have reviewed the patient's current medications. Scheduled Meds:   . acetaminophen  650 mg Oral Once  . carbamazepine  400 mg Oral BID  . carbamazepine  200 mg Oral To Major  . dextrose  25 mL Intravenous STAT  . diltiazem (CARDIZEM) infusion  5-15 mg/hr Intravenous Once  . diltiazem  10 mg Intravenous Once  . heparin  5,000 Units Subcutaneous Q8H  . influenza  inactive virus vaccine  0.5 mL Intramuscular Tomorrow-1000  . levETIRAcetam  1,000 mg Oral To Major  . levETIRAcetam  1,000 mg Oral BID  . LORazepam  1 mg Intravenous Once  . sodium chloride  3 mL Intravenous Q12H  . DISCONTD: ibuprofen  800 mg Oral Once  . DISCONTD: oxyCODONE-acetaminophen  1 tablet Oral Once   Continuous Infusions:  PRN Meds:.acetaminophen  Assessment/Plan: #Seizure: Likely due to medication non compliance (patient ran out of medication a few days ago).  No sz activity overnight, and he has been restarted on his home meds.  UDS was negative, and electrolytes remain wnl.  Arachnoid cyst remains present on CT (pt is s/p drainage in 2007.  Apparently, patient had sz activity in Nov 2012, and mother is concerned that he had breakthrough sz then. Negative EtOH level.  CBGs remain >70.  HIV negative.  -Continue tegretol and keppra -Discuss with [facility] regarding old brain imaging to compare current CT scan vs repeating imaging with MRI;  Also would like to discuss sz regimen since at his last ED admission in December, he was apparently taking medication as per mother, so possible breakthrough sz?  ADDENDUM: discussed with Dr. Marjory Lies.  He reviewed imaging, and suggests that there is no role for re-imaging or draining since it is an arachnoid  cyst.  He also suggests maintaining patient on current medications and following up with him in 2 months so he can check levels.  Patient's mother told me that she thinks the pharmacy has been requesting refills from patient's old neurologist, which is why medication was not refilled.  We are still awaiting records from [facility].  #Aflutter: patient is NSR this morning and apparently even upon reaching floor last night, dilt gtt was not needed.  At this point, cardiology does not see necessity for further work up if TTE is normal, and believes aflutter to be 2/2 seizure.  CXR does not suggest cardiopulmonary pathology such as cardiomegaly/pericarditis.  TSH wnl. -2D echo today -Discuss with cardiology to be sure they are aware of 2 cardiac events that his mother discussed with [place] -Have pt ambulate while on monitor  today  ADDENDUM: discussed with Dr. Johney Frame, because patient has converted back to NSR and is not experiencing any CP, and denies DOE (but does note he feels out of shape when playing basketball), if echo is normal, patient will be followed by Dr. Johney Frame in 4 weeks outpatient where they may discuss further workup at that point.  #h/o ATN: 2/2 to NSAIDs, Cr is improving today.  #VTE ppx: Heparin  #Dispo: likely d/c today or tom   LOS: 1 day   KAPADIA, Askari Kinley 09/23/2011, 9:18 AM

## 2011-09-23 NOTE — Progress Notes (Signed)
SUBJECTIVE: The patient is doing well today.  At this time, he denies chest pain, shortness of breath, or any new concerns. Wants to go home.    Marland Kitchen acetaminophen  650 mg Oral Once  . carbamazepine  400 mg Oral BID  . carbamazepine  200 mg Oral To Major  . dextrose  25 mL Intravenous STAT  . diltiazem (CARDIZEM) infusion  5-15 mg/hr Intravenous Once  . diltiazem  10 mg Intravenous Once  . heparin  5,000 Units Subcutaneous Q8H  . influenza  inactive virus vaccine  0.5 mL Intramuscular Tomorrow-1000  . levETIRAcetam  1,000 mg Oral To Major  . levETIRAcetam  1,000 mg Oral BID  . LORazepam  1 mg Intravenous Once  . sodium chloride  3 mL Intravenous Q12H  . DISCONTD: ibuprofen  800 mg Oral Once  . DISCONTD: oxyCODONE-acetaminophen  1 tablet Oral Once      OBJECTIVE: Physical Exam: Filed Vitals:   09/23/11 0000 09/23/11 0200 09/23/11 0400 09/23/11 0600  BP: 126/68 124/69 111/51 105/63  Pulse: 78 71 64   Temp:   97.7 F (36.5 C)   TempSrc:   Oral   Resp: 18     Height:      Weight:      SpO2: 99% 97% 98%     Intake/Output Summary (Last 24 hours) at 09/23/11 0740 Last data filed at 09/23/11 0000  Gross per 24 hour  Intake    620 ml  Output    225 ml  Net    395 ml    Telemetry reveals sinus rhythm  GEN- The patient is well appearing, sleeping but arouses   Head- normocephalic, atraumatic Eyes-  Sclera clear, conjunctiva pink Ears- hearing intact Oropharynx- clear Neck- supple, no JVP Lymph- no cervical lymphadenopathy Lungs- Clear to ausculation bilaterally, normal work of breathing Heart- Regular rate and rhythm, no murmurs, rubs or gallops, PMI not laterally displaced GI- soft, NT, ND, + BS Extremities- no clubbing, cyanosis, or edema Skin- no rash or lesion Psych- euthymic mood, full affect Neuro- strength and sensation are intact  LABS: Basic Metabolic Panel:  Basename 09/23/11 0525 09/22/11 1319  NA 137 137  K 3.7 4.3  CL 103 103  CO2 26 24  GLUCOSE 84  60*  BUN 14 9  CREATININE 1.22 1.32  CALCIUM 8.9 9.4  MG -- --  PHOS -- --   Liver Function Tests:  [facility] 09/23/11 0525  AST 19  ALT 22  ALKPHOS 77  BILITOT 0.2*  PROT 6.6  ALBUMIN 3.2*   No results found for this basename: LIPASE:2,AMYLASE:2 in the last 72 hours CBC:  Basename 09/23/11 0525 09/22/11 1319  WBC 9.9 10.4  NEUTROABS -- 8.5*  HGB 14.3 15.2  HCT 40.8 42.4  MCV 89.9 89.8  PLT 278 270   Thyroid Function Tests:  Basename 09/22/11 1824  TSH 1.167  T4TOTAL --  T3FREE --  THYROIDAB --   Anemia Panel: No results found for this basename: VITAMINB12,FOLATE,FERRITIN,TIBC,IRON,RETICCTPCT in the last 72 hours  RADIOLOGY: Dg Chest 2 View  09/22/2011  *RADIOLOGY REPORT*  Clinical Data: Seizure.  CHEST - 2 VIEW  Comparison: 01/14/2011.  Findings:  Cardiopericardial silhouette within normal limits. Mediastinal contours normal. Trachea midline.  No airspace disease or effusion.  IMPRESSION: No active cardiopulmonary disease.  No interval change.  Original Report Authenticated By: Andreas Newport, M.D.   Ct Head Wo Contrast  09/22/2011  *RADIOLOGY REPORT*  Clinical Data: Seizure, left side head and neck pain, history  of arachnoid cyst  CT HEAD WITHOUT CONTRAST  Technique:  Contiguous axial images were obtained from the base of the skull through the vertex without contrast.  Comparison: None  Findings: Large extra-axial CSF attenuation fluid collection identified laterally in left parietal region, 7.1 x 3.8 cm image 24 extending 5.2 cm cranial-caudal. Lesion is compatible with the patient's history of arachnoid cyst. Few linear areas of slightly higher attenuation are seen within the anterior aspect of the collection, somewhat atypical for arachnoid cyst, question related to prior surgery at this site or septation. Normal ventricular morphology. No midline shift or mass effect. Remaining brain parenchyma normal appearance. No intracranial hemorrhage or evidence of infarction.  No extra-axial fluid collection. Bones and sinuses otherwise unremarkable.  IMPRESSION: Prior left temporal craniotomy. Large CSF attenuation collection extra-axial in left parietal region, consistent with arachnoid cyst. Few linear areas of slightly higher attenuation are seen within the anterior aspect of this collection, question septations, uncertain etiology; consider either comparison to prior outside exams to assess stability or further evaluation by MR imaging with and without contrast to assess.  Original Report Authenticated By: Lollie Marrow, M.D.    ASSESSMENT AND PLAN:   1. Atrial flutter- typical appearing by ekg Likely due to recently seizure.  His CHADSVASC score is 0.  Per guidelines, I would therefore recommend no anticoagulation for him going forward.  I think that with his seizure history, we should avoid aspirin. Echo is pending, if normal, no further inpatient CV workup is planned.  OK to discharge to home. Follow-up with me in 4 weeks. I will see as needed while here.   Hillis Range, MD 09/23/2011 7:40 AM

## 2011-09-24 MED ORDER — LEVETIRACETAM 1000 MG PO TABS: 1000.0000 mg | ORAL_TABLET | Freq: Two times a day (BID) | ORAL | Status: DC

## 2011-09-24 MED ORDER — CARBAMAZEPINE ER 400 MG PO TB12: 400.0000 mg | ORAL_TABLET | Freq: Two times a day (BID) | ORAL | Status: DC

## 2011-09-24 NOTE — Progress Notes (Signed)
Pt to be d/ced home with mother and girlfriend pt stable no complaints. Instructions given to mother by Dr Ho.prescriptions given to pt.   

## 2011-09-24 NOTE — Discharge Summary (Signed)
Internal Medicine Teaching [facility] Discharge Note  Name: Ricky Humphrey MRN: [id number] DOB: [birth year] 21 y.o.  Date of Admission: 09/22/2011 12:37 PM Date of Discharge: 09/24/2011 Attending Physician: Farley Ly, MD  Discharge Diagnosis: Active Problems:  Atrial flutter  Seizure   Discharge Medications: Medication List  As of 09/24/2011  1:48 PM   TAKE these medications         carbamazepine 200 MG 12 hr capsule   Commonly known as: CARBATROL   Take 400 mg by mouth 2 (two) times daily.      levETIRAcetam 1000 MG tablet   Commonly known as: KEPPRA   Take 1,000 mg by mouth 2 (two) times daily.            Disposition and follow-up:   [name] was discharged from [facility] in stable and improved condition.    Follow-up Appointments: Follow-up Information    Follow up with Hillis Range, MD on 10/24/2011. (@9AM )    Contact information:   [address], [address] [place] [address] [phone]       Follow up with Joycelyn Schmid, MD in 2 months.   Contact information:   [address] [place] [address] [facility] [place] [address] [phone]       Follow up with Lorretta Harp, MD on 10/10/2011. (@2 :15PM)    Contact information:   [address] [place] [address] [phone]         Discharge Orders    Future Appointments: Provider: Department: Dept Phone: Center:   10/10/2011 2:15 PM Lorretta Harp, MD [facility] [phone] [facility]   10/24/2011 9:00 AM Gardiner Rhyme, MD [facility] [phone] [facility]     Future Orders Please Complete By Expires   Diet general      Increase activity slowly      Discharge instructions      Comments:   Do not drive until you are cleared by Neurology after 6 months seizure free.  You need to continue taking your Keppra and Tegretol as prescribed and follow up with Internal Medicine  Clinic to repeat your Tegretol level in 2 weeks.  Then follow up with your Neurologist in 2 months and Cardiology in 1 month.      Consultations:  Cardiology  Procedures Performed:  Dg Chest 2 View  09/22/2011  *RADIOLOGY REPORT*  Clinical Data: Seizure.  CHEST - 2 VIEW  Comparison: 01/14/2011.  Findings:  Cardiopericardial silhouette within normal limits. Mediastinal contours normal. Trachea midline.  No airspace disease or effusion.  IMPRESSION: No active cardiopulmonary disease.  No interval change.  Original Report Authenticated By: Andreas Newport, M.D.   Ct Head Wo Contrast  09/22/2011  *RADIOLOGY REPORT*  Clinical Data: Seizure, left side head and neck pain, history of arachnoid cyst  CT HEAD WITHOUT CONTRAST  Technique:  Contiguous axial images were obtained from the base of the skull through the vertex without contrast.  Comparison: None  Findings: Large extra-axial CSF attenuation fluid collection identified laterally in left parietal region, 7.1 x 3.8 cm image 24 extending 5.2 cm cranial-caudal. Lesion is compatible with the patient's history of arachnoid cyst. Few linear areas of slightly higher attenuation are seen within the anterior aspect of the collection, somewhat atypical for arachnoid cyst, question related to prior surgery at this site or septation. Normal ventricular morphology. No midline shift or mass effect. Remaining brain parenchyma normal appearance. No intracranial  hemorrhage or evidence of infarction. No extra-axial fluid collection. Bones and sinuses otherwise unremarkable.  IMPRESSION: Prior left temporal craniotomy. Large CSF attenuation collection extra-axial in left parietal region, consistent with arachnoid cyst. Few linear areas of slightly higher attenuation are seen within the anterior aspect of this collection, question septations, uncertain etiology; consider either comparison to prior outside exams to assess stability or further evaluation by MR imaging with and  without contrast to assess.  Original Report Authenticated By: Lollie Marrow, M.D.    2D Echo 09/23/11: - Left ventricle: The cavity size was normal. Wall thickness was normal. Systolic function was normal. The estimated ejection fraction was in the range of 55% to 60%. Wall motion was normal; there were no regional wall motion abnormalities. Left ventricular diastolic function parameters were normal. - Left atrium: The atrium was mildly dilated.   Admission HPI: Patient is a 21 year old male with a history of seizures, and arachnoid cyst status post drainage reportedly in 2007, brought to the ED after a seizure which occurred when he had been out of his medications for about 2 days; he was noted to be in atrial flutter in the emergency department and was admitted for further management and evaluation. Patient denies a prior history of heart problems; however his mother reports that he had an episode in November of last year when he became profoundly diaphoretic and complained of a bandlike chest tightness after riding his bike. She says he was referred to [facility] for evaluation, and that they attempted a stress study but that he was unwilling to exercise to a stress level because he was concerned about recurrent symptoms. At that time she reports that a chemical stress study was discussed, but he did not return for the study. She also reports a similar episode about 2 years ago. She reports that he is followed at [facility]; she also reports a seizure that occurred in December of 2012 while he was on his medication which she feels was a breakthrough seizure. Patient denies chest pain, palpitations, dyspnea, or diaphoresis associated with events preceding current admission.   Hospital Course by problem list: 1. Seizure: The patient presented after an un-observed seizure with postictal confusion and weakness. Patient has a history of complex partial seizures of similar presentation.  The most likely seizure etiology is medication non-compliance. The patient says he ran out of medications 2 days prior to his seizure, but his low carbamazepine serum levels indicate he has been without carbamazepine for a longer period of time. We discussed the importance of medication compliance, and patient claims he has never missed any doses because of side effects. The patient claims his pharmacy was contacting the wrong neurologist, and subsequently his prescription went unfilled. On admission the patient was restarted on his outpatient doses of keppra and carbamazepine, and did not have any other episode of seizures during his hospitalization. 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 Non-contrast CT in the hospital demonstrated a large left temporal arachnoid cyst, but no other intracranial pathology. We discussed the seizure pathogenesis with Dr. Bonnita Hollow is patient's outpatient neurologist and he recommended no further cyst drainage or imaging. Dr. Marjory Lies recommended current home med dosage and follow-up with Neurology in 8 weeks. Medical records from [facility] indicate the patient was last seen in their system on March 2012. At that time, neurology had been down-titrating the patient's carbamazapine, due to excessive day time somnolence, while uptitrating keppra for  adequate seizure control.  On admission the patient electrolytes were within normal limits. The patient's blood glucose was in the 80, but appears to be a stable level for the patient. The patient was EtOH and Utox negative. The patient's HIV serologies were negative. Will continue the patient's outpatient carbamazepine and keppra, and draw serum carbamazepine levels in 2 weeks at the Internal Medicine clinic to evaluate for therapeutic levels.    2. Aflutter: On admission the patient was noted to be in atrial flutter, which spontaneously resolved.  Subsequently, the patient was placed on telemetry and no further atrial flutter was observed even when patient was up walking around the unit, no irregular rhymth was noted. Per patient's mother's report, patient has had 2 episodes of squeezing chest pain with diaphoresis in the past while riding his bicycle or exercise and would like a cardiac work-up. The patient denies any SOB or chestpain during his hospitalization. During hospitalization the patient received a TTE with revealed an EF of 55-60% and mild dilated left atrial.  The patient was seen by cardiology consultation, who believes the patient's atrial flutter is most likely secondary to his seizure.  Dr. Milbert Coulter personally spoke to Dr. Johney Frame and brought up the patient's mother's concerns about the past "2 cardiac events"; he stated that given the patient's benign TTE, lack of further atrial flutter, and lack of symptoms currently; the patient will be evaluated as an outpatient in 4 weeks by Dr. Allred/Cardiology.  3. H/o ATN: 2/2 to NSAIDs/rhabdomyolysis per mother's report, Cr was stable throughout hospital course (Cr 1.2-1.3). Will need to continue to follow Cr as an outpatient  VTE ppx: Heparin was used for VTE prophylaxis  Discharge Vitals:  BP 113/73  Pulse 58  Temp(Src) 98.1 F (36.7 C) (Oral)  Resp 18  Ht 6\' 4"  (1.93 m)  Wt 194 lb (87.998 kg)  BMI 23.61 kg/m2  SpO2 98%  Discharge Labs: No results found for this or any previous visit (from the past 24 hour(s)).  Signed: Loucile Posner 09/24/2011, 1:48 PM

## 2011-09-24 NOTE — Progress Notes (Signed)
Internal Medicine Attending  Date: 09/24/2011  Patient name: Ricky Humphrey Medical record number: [id number] Date of birth: 01-May-1991 Age: 21 y.o. Gender: male  I saw and evaluated the patient. I reviewed the resident's note by Dr. Anselm Jungling and I agree with the resident's findings and plans as documented in her note, with additional comments are as follows.  Patient has had no recurrence of atrial flutter or seizures since admission.  His echocardiogram was normal.  He will follow up with Dr. Clyde Lundborg in 2 weeks for assessment and for a Tegretol level; he will also follow up with Dr. Johney Frame and with his neurologist based on their recommendation.  We advised him not to drive until he has been cleared by his neurologist.

## 2011-09-24 NOTE — ED Provider Notes (Signed)
Medical screening examination/treatment/procedure(s) were conducted as a shared visit with non-physician practitioner(s) and myself.  I personally evaluated the patient during the encounter  This 21yo M w Hx of seizures, incompletely evaluated prior cardiac "issues" now presents after a witnessed seizure.  On exam he is sleeping, though he awakens during his ED stay. His initial ECG is notable for aflutter.  He spontaneously converted to NSR.  With this new dysrhythmia, the patient was admitted for further e/m.  ECG - abnormal (AFLUTTER) Cardiac monitor 70 - sr, normal (post-conversion) Pulse ox 100% RA - normal  Gerhard Munch, MD 09/24/11 [phone]

## 2011-09-24 NOTE — Progress Notes (Addendum)
Subjective: Patient is doing well, no complaints this morning, denies any seizure episodes since hospital admission, denies any chestpain or SOB.  Objective: Vital signs in last 24 hours: Filed Vitals:   09/23/11 2032 09/24/11 0042 09/24/11 0300 09/24/11 0409  BP:    113/73  Pulse:      Temp: 98.3 F (36.8 C) 98.6 F (37 C) 98.3 F (36.8 C)   TempSrc:   Oral   Resp:      Height:      Weight:      SpO2: 96%  98%    Weight change:   Intake/Output Summary (Last 24 hours) at 09/24/11 0829 Last data filed at 09/23/11 1800  Gross per 24 hour  Intake    720 ml  Output    400 ml  Net    320 ml   Physical Exam: General: sleeping in bed, no acute distress, cooperative to exam  HEENT: PERRL, EOMI, no scleral icterus, no conjunctival pallor  Cardiac: RRR, no rubs, murmurs or gallops  Pulm: clear to auscultation bilaterally, moving normal volumes of air  Abd: soft, nontender, nondistended, BS normoactive  Ext: warm and well perfused, no pedal edema  Neuro: alert and oriented X3, non focal exam  Lab Results: Basic Metabolic Panel:  Lab 09/23/11 8657 09/22/11 1319  NA 137 137  K 3.7 4.3  CL 103 103  CO2 26 24  GLUCOSE 84 60*  BUN 14 9  CREATININE 1.22 1.32  CALCIUM 8.9 9.4  MG -- --  PHOS -- --   Liver Function Tests:  Lab 09/23/11 0525  AST 19  ALT 22  ALKPHOS 77  BILITOT 0.2*  PROT 6.6  ALBUMIN 3.2*  CBC:  Lab 09/23/11 0525 09/22/11 1319  WBC 9.9 10.4  NEUTROABS -- 8.5*  HGB 14.3 15.2  HCT 40.8 42.4  MCV 89.9 89.8  PLT 278 270   CBG:  Lab 09/23/11 1141 09/23/11 0619 09/22/11 2330 09/22/11 2043 09/22/11 1741  GLUCAP 85 82 102* 95 84   Thyroid Function Tests:  Lab 09/22/11 1824  TSH 1.167  T4TOTAL --  FREET4 --  T3FREE --  THYROIDAB --   Urine Drug Screen: Drugs of Abuse     Component Value Date/Time   LABOPIA NONE DETECTED 09/22/2011 2328   COCAINSCRNUR NONE DETECTED 09/22/2011 2328   LABBENZ NONE DETECTED 09/22/2011 2328   AMPHETMU NONE  DETECTED 09/22/2011 2328   THCU NONE DETECTED 09/22/2011 2328   LABBARB NONE DETECTED 09/22/2011 2328    Alcohol Level:  Lab 09/22/11 1946  ETH <11    Micro Results: Recent Results (from the past 240 hour(s))  MRSA PCR SCREENING     Status: Normal   Collection Time   09/22/11  7:13 PM      Component Value Range Status Comment   MRSA by PCR NEGATIVE  NEGATIVE  Final    Studies/Results: Dg Chest 2 View  09/22/2011  *RADIOLOGY REPORT*  Clinical Data: Seizure.  CHEST - 2 VIEW  Comparison: 01/14/2011.  Findings:  Cardiopericardial silhouette within normal limits. Mediastinal contours normal. Trachea midline.  No airspace disease or effusion.  IMPRESSION: No active cardiopulmonary disease.  No interval change.  Original Report Authenticated By: Andreas Newport, M.D.   Ct Head Wo Contrast  09/22/2011  *RADIOLOGY REPORT*  Clinical Data: Seizure, left side head and neck pain, history of arachnoid cyst  CT HEAD WITHOUT CONTRAST  Technique:  Contiguous axial images were obtained from the base of the skull through the vertex  without contrast.  Comparison: None  Findings: Large extra-axial CSF attenuation fluid collection identified laterally in left parietal region, 7.1 x 3.8 cm image 24 extending 5.2 cm cranial-caudal. Lesion is compatible with the patient's history of arachnoid cyst. Few linear areas of slightly higher attenuation are seen within the anterior aspect of the collection, somewhat atypical for arachnoid cyst, question related to prior surgery at this site or septation. Normal ventricular morphology. No midline shift or mass effect. Remaining brain parenchyma normal appearance. No intracranial hemorrhage or evidence of infarction. No extra-axial fluid collection. Bones and sinuses otherwise unremarkable.  IMPRESSION: Prior left temporal craniotomy. Large CSF attenuation collection extra-axial in left parietal region, consistent with arachnoid cyst. Few linear areas of slightly higher attenuation  are seen within the anterior aspect of this collection, question septations, uncertain etiology; consider either comparison to prior outside exams to assess stability or further evaluation by MR imaging with and without contrast to assess.  Original Report Authenticated By: Lollie Marrow, M.D.   Medications: Scheduled Meds:   . carbamazepine  400 mg Oral BID  . dextrose  25 mL Intravenous STAT  . heparin  5,000 Units Subcutaneous Q8H  . influenza  inactive virus vaccine  0.5 mL Intramuscular Tomorrow-1000  . levETIRAcetam  1,000 mg Oral BID  . sodium chloride  3 mL Intravenous Q12H  . DISCONTD: diltiazem (CARDIZEM) infusion  5-15 mg/hr Intravenous Once  . DISCONTD: diltiazem  10 mg Intravenous Once   Continuous Infusions:  PRN Meds:.acetaminophen  Assessment/Plan: #Seizure: stable, no seizure episodes since admission.  Medical records from [facility]- Neurology was trying to titrate down his Tegretol because patient was somnolent on higher dose and patient was diagnosed with complex seizure.   -Continue tegretol and keppra  -Will follow up with Dr. Clyde Lundborg in 2 weeks for a carbamazepine trough to assess if patient is reaching therapeutic levels.  -Discussed with Dr. Marjory Lies and he did not think patient needs other imagings and will  follow up as outpatient in 2 months. - Reinforce to patient to avoid driving motor vehicles given his seizure risk, must need clearance from neurology after 6 months seizure free.   #Aflutter: Resolved.  Patient has been in normal sinus rhythm over the past 24 hours. Review of the telemetry stip did not show any periods with atrial flutter even when he was up walking. TTE does not reveal any structural causes for atrial flutter. At this point, cardiology believes aflutter to be 2/2 seizure. CXR does not suggest cardiopulmonary pathology such as cardiomegaly/pericarditis. TSH wnl.  -2D echo- normal results -Discussed with cardiology to be sure they are aware of 2  cardiac events that his mother discussed with [place]. Dr. Milbert Coulter spoke directly with Dr. Johney Frame, because patient has converted back to NSR and is not experiencing any CP, and denies DOE (but does note he feels out of shape when playing basketball). Given normal echo and lack of current symptoms patient will be followed by Dr. Johney Frame in 4 weeks outpatient where they may discuss further workup at that point.   #h/o ATN: 2/2 to NSAIDs, Cr is stable.  Will need to follow BMP as outpatient.  #VTE ppx: Heparin   #Dispo: d/c today, will call patient's mother to give updates.    LOS: 2 days   Zuleika Gallus 09/24/2011, 8:29 AM

## 2011-09-24 NOTE — Progress Notes (Signed)
Pt to be d/ced home with mother and girlfriend pt stable no complaints. Instructions given to mother by Dr Ho.prescriptions given to pt.

## 2011-10-01 ENCOUNTER — Encounter (HOSPITAL_COMMUNITY): Payer: Self-pay | Admitting: Emergency Medicine

## 2011-10-01 ENCOUNTER — Emergency Department (HOSPITAL_COMMUNITY)
Admission: EM | Admit: 2011-10-01 | Discharge: 2011-10-01 | Disposition: A | Discharge status: Home / Self Care | Payer: 59 | Source: Home / Self Care | Attending: Emergency Medicine | Admitting: Emergency Medicine

## 2011-10-01 DIAGNOSIS — J029 Acute pharyngitis, unspecified: Secondary | ICD-10-CM

## 2011-10-01 LAB — POCT RAPID STREP A: Streptococcus, Group A Screen (Direct): NEGATIVE

## 2011-10-01 MED ORDER — ACETAMINOPHEN-CODEINE #3 300-30 MG PO TABS: 1.0000 | ORAL_TABLET | Freq: Four times a day (QID) | ORAL | Status: AC | PRN

## 2011-10-01 NOTE — ED Provider Notes (Addendum)
History     CSN: [id number]  Arrival date & time 10/01/11  1714   First MD Initiated Contact with Patient 10/01/11 1928      Chief Complaint  Patient presents with  . Sore Throat    (Consider location/radiation/quality/duration/timing/severity/associated sxs/prior treatment) HPI Comments: Ricky Humphrey presents today complaining of a sore throat started Monday discomfort increases when he swallows. Admits to some nasal congestion minimal no cough uncertain if he has had any fevers at home. Able to drink any and no shortness of breath.  Mother insists that Ricky Humphrey has experienced previous strep infections that have been negative on initial test.  Patient is a 21 y.o. male presenting with pharyngitis. The history is provided by the patient and a parent.  Sore Throat Pertinent negatives include no shortness of breath.    Past Medical History  Diagnosis Date  . Seizures   . Cerebral palsy     mild  . Paroxysmal atrial flutter     brief, post sz on 09/22/11  . Arachnoid cyst     s/p drainage by craniotomy    Past Surgical History  Procedure Date  . Acrnoid cyst     History reviewed. No pertinent family history.  History  Substance Use Topics  . Smoking status: Never Smoker   . Smokeless tobacco: Never Used  . Alcohol Use: Yes     occasional      Review of Systems  Constitutional: Positive for chills and activity change.  HENT: Positive for congestion and trouble swallowing. Negative for mouth sores, neck pain, neck stiffness and sinus pressure.   Respiratory: Negative for cough and shortness of breath.     Allergies  Review of patient's allergies indicates no known allergies.  Home Medications   Current Outpatient Rx  Name Route Sig Dispense Refill  . ACETAMINOPHEN-CODEINE #3 300-30 MG PO TABS Oral Take 1-2 tablets by mouth every 6 (six) hours as needed for pain. 15 tablet 0  . CARBAMAZEPINE ER 200 MG PO CP12 Oral Take 400 mg by mouth 2 (two) times daily.       Marland Kitchen CARBAMAZEPINE ER 400 MG PO TB12 Oral Take 1 tablet (400 mg total) by mouth 2 (two) times daily. 60 tablet 3  . LEVETIRACETAM 1000 MG PO TABS Oral Take 1,000 mg by mouth 2 (two) times daily.      Marland Kitchen LEVETIRACETAM 1000 MG PO TABS Oral Take 1 tablet (1,000 mg total) by mouth 2 (two) times daily. 60 tablet 3    BP 123/75  Pulse 66  Temp(Src) 99.2 F (37.3 C) (Oral)  Resp 18  SpO2 98%  Physical Exam  Constitutional: He appears well-developed and well-nourished. No distress.  HENT:  Head: Normocephalic.  Right Ear: Tympanic membrane normal.  Left Ear: Tympanic membrane normal.  Mouth/Throat: Uvula is midline. No uvula swelling. Posterior oropharyngeal erythema present. No oropharyngeal exudate, posterior oropharyngeal edema or tonsillar abscesses.  Eyes: Conjunctivae are normal. Right eye exhibits no discharge. Left eye exhibits no discharge.  Pulmonary/Chest: Effort normal and breath sounds normal.    ED Course  Procedures (including critical care time)   Labs Reviewed  POCT RAPID STREP A ([facility] CARE ONLY)  STREP A DNA PROBE   No results found.   1. Pharyngitis       MDM  Patient with pharyngitis for less than 72 hours afebrile no peritonsillar or tonsillar swelling noted on exam. Initial strep screen was negative throat culture sample obtained.  Ricky Molly, MD 10/01/11 [id number]  Ricky Molly, MD 10/01/11 [phone]

## 2011-10-01 NOTE — ED Notes (Signed)
PT HERE WITH SORE THROAT THAT STARTED Monday WITH COLD SX.

## 2011-10-02 LAB — STREP A DNA PROBE: Group A Strep Probe: NEGATIVE

## 2011-10-10 ENCOUNTER — Encounter: Payer: 59 | Admitting: Internal Medicine

## 2011-10-24 ENCOUNTER — Encounter: Payer: 59 | Admitting: Internal Medicine

## 2012-10-12 ENCOUNTER — Emergency Department (HOSPITAL_COMMUNITY)
Admission: EM | Admit: 2012-10-12 | Discharge: 2012-10-12 | Disposition: A | Discharge status: Home / Self Care | Payer: 59 | Source: Home / Self Care | Attending: Family Medicine | Admitting: Family Medicine

## 2012-10-12 ENCOUNTER — Encounter (HOSPITAL_COMMUNITY): Payer: Self-pay | Admitting: *Deleted

## 2012-10-12 DIAGNOSIS — B356 Tinea cruris: Secondary | ICD-10-CM

## 2012-10-12 MED ORDER — TERBINAFINE HCL 250 MG PO TABS: 250.0000 mg | ORAL_TABLET | Freq: Every day | ORAL | Status: DC

## 2012-10-12 MED ORDER — TERBINAFINE HCL 1 % EX CREA: TOPICAL_CREAM | Freq: Two times a day (BID) | CUTANEOUS | Status: DC

## 2012-10-12 NOTE — ED Provider Notes (Signed)
History     CSN: [id number]  Arrival date & time 10/12/12  4098   First MD Initiated Contact with Patient 10/12/12 1849      Chief Complaint  Patient presents with  . Rash    (Consider location/radiation/quality/duration/timing/severity/associated sxs/prior treatment) Patient is a 22 y.o. male presenting with rash. The history is provided by the patient.  Rash Location:  Ano-genital Ano-genital rash location:  Scrotum and penis Quality: itchiness and redness   Severity:  Mild Progression:  Spreading Chronicity:  New Relieved by:  Nothing Worsened by:  Nothing tried Ineffective treatments:  None tried   Past Medical History  Diagnosis Date  . Seizures   . Cerebral palsy     mild  . Paroxysmal atrial flutter     brief, post sz on 09/22/11  . Arachnoid cyst     s/p drainage by craniotomy    Past Surgical History  Procedure Laterality Date  . Acrnoid cyst      No family history on file.  History  Substance Use Topics  . Smoking status: Never Smoker   . Smokeless tobacco: Never Used  . Alcohol Use: Yes     Comment: occasional      Review of Systems  Constitutional: Negative.   Genitourinary: Negative for discharge, penile swelling, scrotal swelling, penile pain and testicular pain.  Skin: Positive for rash.    Allergies  Review of patient's allergies indicates no known allergies.  Home Medications   Current Outpatient Rx  Name  Route  Sig  Dispense  Refill  . carbamazepine (CARBATROL) 200 MG 12 hr capsule   Oral   Take 400 mg by mouth 2 (two) times daily.           Marland Kitchen EXPIRED: carbamazepine (TEGRETOL XR) 400 MG 12 hr tablet   Oral   Take 1 tablet (400 mg total) by mouth 2 (two) times daily.   60 tablet   3   . levETIRAcetam (KEPPRA) 1000 MG tablet   Oral   Take 1,000 mg by mouth 2 (two) times daily.           Marland Kitchen EXPIRED: levETIRAcetam (KEPPRA) 1000 MG tablet   Oral   Take 1 tablet (1,000 mg total) by mouth 2 (two) times daily.   60  tablet   3   . terbinafine (LAMISIL) 1 % cream   Topical   Apply topically 2 (two) times daily.   36 g   0   . terbinafine (LAMISIL) 250 MG tablet   Oral   Take 1 tablet (250 mg total) by mouth daily.   14 tablet   0     BP 133/74  Pulse 77  Temp(Src) 98.7 F (37.1 C) (Oral)  Resp 20  SpO2 99%  Physical Exam  Nursing note and vitals reviewed. Constitutional: He is oriented to person, place, and time. He appears well-developed and well-nourished.  Abdominal: Soft. Bowel sounds are normal.  Neurological: He is alert and oriented to person, place, and time.  Skin: Skin is warm and dry. Rash noted.  Pale circular 8-10 mm lesions on penis and scrotum, sl scaly., nontender, dry.    ED Course  Procedures (including critical care time)  Labs Reviewed - No data to display No results found.   1. Groin ringworm       MDM          Linna Hoff, MD 10/12/12 [phone]

## 2012-10-12 NOTE — ED Notes (Signed)
Pt  Has  A  Rash      On    Groin  Area            X  2  Weeks                 denys  Any  Penile  Discharge  Or  Any  Other  Symptoms

## 2012-10-18 ENCOUNTER — Emergency Department (HOSPITAL_COMMUNITY)
Admission: EM | Admit: 2012-10-18 | Discharge: 2012-10-18 | Disposition: A | Discharge status: Home / Self Care | Payer: 59 | Attending: Emergency Medicine | Admitting: Emergency Medicine

## 2012-10-18 ENCOUNTER — Encounter (HOSPITAL_COMMUNITY): Payer: Self-pay | Admitting: *Deleted

## 2012-10-18 DIAGNOSIS — Z8679 Personal history of other diseases of the circulatory system: Secondary | ICD-10-CM | POA: Insufficient documentation

## 2012-10-18 DIAGNOSIS — G40909 Epilepsy, unspecified, not intractable, without status epilepticus: Secondary | ICD-10-CM | POA: Insufficient documentation

## 2012-10-18 DIAGNOSIS — R5381 Other malaise: Secondary | ICD-10-CM | POA: Insufficient documentation

## 2012-10-18 DIAGNOSIS — R404 Transient alteration of awareness: Secondary | ICD-10-CM | POA: Insufficient documentation

## 2012-10-18 DIAGNOSIS — Z79899 Other long term (current) drug therapy: Secondary | ICD-10-CM | POA: Insufficient documentation

## 2012-10-18 DIAGNOSIS — Z8669 Personal history of other diseases of the nervous system and sense organs: Secondary | ICD-10-CM | POA: Insufficient documentation

## 2012-10-18 DIAGNOSIS — Z9889 Other specified postprocedural states: Secondary | ICD-10-CM | POA: Insufficient documentation

## 2012-10-18 DIAGNOSIS — R569 Unspecified convulsions: Secondary | ICD-10-CM

## 2012-10-18 DIAGNOSIS — R51 Headache: Secondary | ICD-10-CM | POA: Insufficient documentation

## 2012-10-18 HISTORY — DX: Epilepsy, unspecified, not intractable, without status epilepticus: G40.909

## 2012-10-18 LAB — URINALYSIS, ROUTINE W REFLEX MICROSCOPIC
Bilirubin Urine: NEGATIVE
Glucose, UA: NEGATIVE mg/dL
Ketones, ur: NEGATIVE mg/dL
Leukocytes, UA: NEGATIVE
Nitrite: NEGATIVE
Protein, ur: 30 mg/dL — AB
Specific Gravity, Urine: 1.018 (ref 1.005–1.030)
Urobilinogen, UA: 1 mg/dL (ref 0.0–1.0)
pH: 5.5 (ref 5.0–8.0)

## 2012-10-18 LAB — URINE MICROSCOPIC-ADD ON

## 2012-10-18 LAB — GLUCOSE, CAPILLARY: Glucose-Capillary: 71 mg/dL (ref 70–99)

## 2012-10-18 MED ORDER — SODIUM CHLORIDE 0.9 % IV SOLN
1000.0000 mg | Freq: Once | INTRAVENOUS | Status: AC
  Administered 2012-10-18: 1000 mg via INTRAVENOUS
  Filled 2012-10-18: qty 10

## 2012-10-18 MED ORDER — SODIUM CHLORIDE 0.9 % IV SOLN
Freq: Once | INTRAVENOUS | Status: AC
  Administered 2012-10-18: 14:00:00 via INTRAVENOUS

## 2012-10-18 MED ORDER — CARBAMAZEPINE 200 MG PO TABS
800.0000 mg | ORAL_TABLET | Freq: Once | ORAL | Status: AC
  Administered 2012-10-18: 800 mg via ORAL
  Filled 2012-10-18: qty 4

## 2012-10-18 NOTE — ED Notes (Signed)
MVH:QI69<GE> Expected date:10/18/12<BR> Expected time:12:51 PM<BR> Means of arrival:Ambulance<BR> Comments:<BR> Seizure

## 2012-10-18 NOTE — ED Notes (Signed)
MD at bedside. 

## 2012-10-18 NOTE — ED Notes (Signed)
Pt from home, brought in by [facility] with reports of seizures that started last night. Pt reports that seizures were unwitnessed but pt's mom whom works as a NT in [employer] ED noted that upon checking on pt this afternoon that pt was sweating, breathing fast and mouth was bleeding. Pt reports that he has been out of seizure medication for 3 days but per pt's mom, pt has medication by bed but has not taken it. Pt endorses hx of epilepsy. Pt noted to be drowsy but oriented, seizure pads placed on bed and pt placed on cardiac monitor.

## 2012-10-18 NOTE — Progress Notes (Signed)
Updated pcp [facility] on [address] - Dr Andi Devon

## 2012-10-18 NOTE — ED Provider Notes (Signed)
History     CSN: [id number]  Arrival date & time 10/18/12  1314   First MD Initiated Contact with Patient 10/18/12 1336      Chief Complaint  Patient presents with  . Seizures    (Consider location/radiation/quality/duration/timing/severity/associated sxs/prior treatment) HPI This 22 year old male has a history of seizure disorder its indeterminate whether or not he has been somewhat noncompliant with his medicine the last few days, apparently has had 3-4 seizures over the last day or so, these were unwitnessed, his mom came home from work today and found a minute postictal state so brought him to the ED via EMS because he has had paroxysmal atrial flutter when he has been in a post ictal state in the past, his postictal state it can last several hours to a day where he is quite sleepy, he is oriented to person place and time, he is a typical post seizure headache, he is no change in his mental status, he is no change in speech or vision, he has no neck pain back pain chest pain shortness breath abdominal pain, he is no lateralizing weakness or numbness, he just feels generally weak and tired and wants to sleep. There is no treatment prior to arrival other than a saline lock started by EMS. His last seizure was apparently sometime this morning. There is no known trauma. Past Medical History  Diagnosis Date  . Seizures   . Cerebral palsy     mild  . Paroxysmal atrial flutter     brief, post sz on 09/22/11  . 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 Epilepsy     Past Surgical History  Procedure Laterality Date  . Acrnoid cyst      History reviewed. No pertinent family history.  History  Substance Use Topics  . Smoking status: Never Smoker   . Smokeless tobacco: Never Used  . Alcohol Use: Yes     Comment: occasional      Review of Systems 10 Systems reviewed and are negative for acute change except as noted in the HPI. Allergies  Review of patient's allergies  indicates no known allergies.  Home Medications   Current Outpatient Rx  Name  Route  Sig  Dispense  Refill  . carbamazepine (TEGRETOL XR) 400 MG 12 hr tablet   Oral   Take 400 mg by mouth 2 (two) times daily.         Marland Kitchen levETIRAcetam (KEPPRA) 1000 MG tablet   Oral   Take 1,000 mg by mouth 2 (two) times daily.           Marland Kitchen terbinafine (LAMISIL) 1 % cream   Topical   Apply 1 application topically 2 (two) times daily. Applied to ringworm.         . terbinafine (LAMISIL) 250 MG tablet   Oral   Take 1 tablet (250 mg total) by mouth daily.   14 tablet   0     BP 109/51  Pulse 94  Temp(Src) 99.8 F (37.7 C) (Oral)  Resp 18  SpO2 95%  Physical Exam  Nursing note and vitals reviewed. Constitutional: He is oriented to person, place, and time.  Awake, alert but drowsy, nontoxic appearance with baseline speech for patient.  HENT:  Mouth/Throat: No oropharyngeal exudate.  Oropharynx shows evidence of lateral tongue biting without active bleeding consistent with recent seizure  Eyes: EOM are normal. Pupils are equal, round, and reactive to light. Right eye exhibits no discharge.  Left eye exhibits no discharge.  Neck: Neck supple.  Cervical spine nontender  Cardiovascular: Normal rate and regular rhythm.   No murmur heard. Pulmonary/Chest: Effort normal and breath sounds normal. No stridor. No respiratory distress. He has no wheezes. He has no rales. He exhibits no tenderness.  Abdominal: Soft. Bowel sounds are normal. He exhibits no mass. There is no tenderness. There is no rebound.  Musculoskeletal: He exhibits no tenderness.  Baseline ROM, moves extremities with no obvious new focal weakness.  Lymphadenopathy:    He has no cervical adenopathy.  Neurological: He is alert and oriented to person, place, and time.  Awake, alert but drowsy, cooperative and aware of situation oriented to person place and as well as month but he does not remember which day of the week it is;  motor strength 4/5 bilaterally; sensation normal to light touch bilaterally; peripheral visual fields full to confrontation; no facial asymmetry; tongue midline; major cranial nerves appear intact; no pronator drift in arms or legs, normal finger to nose bilaterally  Skin: No rash noted.  Psychiatric: He has a normal mood and affect.    ED Course  Procedures (including critical care time) ECG: Sinus rhythm, ventricular rate 97, normal axis, RSR prime in lead V1, diffuse ST elevation, early repolarization, no significant change noted compared with January 2013   1550- lab drawn but specimen not received at [facility] yet to run Tegretol level.  Labs Reviewed  CARBAMAZEPINE LEVEL, TOTAL - Abnormal; Notable for the following:    Carbamazepine Lvl <0.5 (*)    All other components within normal limits  URINALYSIS, ROUTINE W REFLEX MICROSCOPIC - Abnormal; Notable for the following:    APPearance TURBID (*)    Hgb urine dipstick TRACE (*)    Protein, ur 30 (*)    All other components within normal limits  URINE MICROSCOPIC-ADD ON - Abnormal; Notable for the following:    Bacteria, UA FEW (*)    All other components within normal limits  GLUCOSE, CAPILLARY   No results found.   1. Seizure       MDM   Pt stable in ED with no significant deterioration in condition.  Patient / Family / Caregiver informed of clinical course, understand medical decision-making process, and agree with plan.  I doubt any other EMC precluding discharge at this time including, but not necessarily limited to the following:status epilepticus, CVA.       Hurman Horn, MD 10/25/12 [phone]

## 2012-10-21 NOTE — ED Provider Notes (Signed)
Patient care assumed from Dr. Fonnie Jarvis in signout with Tegretol level pending. Subsequently less than 0.5. Patient was given a dose prior to discharge. Question compliance at home. Stressed importance of taking his seizure medications with patient and mother. Patient is back to his baseline. Return precautions discussed.  Ricky Razor, MD 10/21/12 [phone]

## 2012-11-19 ENCOUNTER — Other Ambulatory Visit: Payer: Self-pay | Admitting: Diagnostic Neuroimaging

## 2012-12-15 ENCOUNTER — Encounter (HOSPITAL_COMMUNITY): Payer: Self-pay | Admitting: Emergency Medicine

## 2012-12-15 ENCOUNTER — Emergency Department (HOSPITAL_COMMUNITY)
Admission: EM | Admit: 2012-12-15 | Discharge: 2012-12-16 | Disposition: A | Discharge status: Home / Self Care | Payer: 59 | Attending: Emergency Medicine | Admitting: Emergency Medicine

## 2012-12-15 ENCOUNTER — Emergency Department (HOSPITAL_COMMUNITY): Payer: 59

## 2012-12-15 DIAGNOSIS — S6990XA Unspecified injury of unspecified wrist, hand and finger(s), initial encounter: Secondary | ICD-10-CM | POA: Insufficient documentation

## 2012-12-15 DIAGNOSIS — Z8669 Personal history of other diseases of the nervous system and sense organs: Secondary | ICD-10-CM | POA: Insufficient documentation

## 2012-12-15 DIAGNOSIS — IMO0002 Reserved for concepts with insufficient information to code with codable children: Secondary | ICD-10-CM | POA: Insufficient documentation

## 2012-12-15 DIAGNOSIS — Z8679 Personal history of other diseases of the circulatory system: Secondary | ICD-10-CM | POA: Insufficient documentation

## 2012-12-15 DIAGNOSIS — G40909 Epilepsy, unspecified, not intractable, without status epilepticus: Secondary | ICD-10-CM | POA: Insufficient documentation

## 2012-12-15 DIAGNOSIS — L03317 Cellulitis of buttock: Secondary | ICD-10-CM | POA: Insufficient documentation

## 2012-12-15 DIAGNOSIS — L0291 Cutaneous abscess, unspecified: Secondary | ICD-10-CM

## 2012-12-15 DIAGNOSIS — Z79899 Other long term (current) drug therapy: Secondary | ICD-10-CM | POA: Insufficient documentation

## 2012-12-15 DIAGNOSIS — Z86011 Personal history of benign neoplasm of the brain: Secondary | ICD-10-CM | POA: Insufficient documentation

## 2012-12-15 DIAGNOSIS — L0231 Cutaneous abscess of buttock: Secondary | ICD-10-CM | POA: Insufficient documentation

## 2012-12-15 DIAGNOSIS — S59909A Unspecified injury of unspecified elbow, initial encounter: Secondary | ICD-10-CM | POA: Insufficient documentation

## 2012-12-15 DIAGNOSIS — M25531 Pain in right wrist: Secondary | ICD-10-CM

## 2012-12-15 MED ORDER — SULFAMETHOXAZOLE-TRIMETHOPRIM 800-160 MG PO TABS: ORAL_TABLET | ORAL | Status: DC

## 2012-12-15 MED ORDER — CEPHALEXIN 500 MG PO CAPS: 500.0000 mg | ORAL_CAPSULE | Freq: Four times a day (QID) | ORAL | Status: DC

## 2012-12-15 NOTE — ED Notes (Signed)
Patient comes in with pain to the right wrist with noted injury, and boil on his left buttocks cheek
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
## 2012-12-16 NOTE — ED Provider Notes (Signed)
Medical screening examination/treatment/procedure(s) were performed by non-physician practitioner and as supervising physician I was immediately available for consultation/collaboration.  Hanaa Payes, MD 12/16/12 1910 

## 2012-12-27 ENCOUNTER — Other Ambulatory Visit: Payer: Self-pay | Admitting: Diagnostic Neuroimaging

## 2012-12-28 ENCOUNTER — Other Ambulatory Visit: Payer: Self-pay | Admitting: Diagnostic Neuroimaging

## 2013-05-18 ENCOUNTER — Telehealth: Payer: Self-pay | Admitting: *Deleted

## 2013-05-18 NOTE — Telephone Encounter (Signed)
Pharmacist called and is asking if ok to use different manufacturer for generic carbatrol refill  I told him that this is ok, have pt call us if problems.  Also relayed that it has been over yr since last seen, pt needs to call for appt.

## 2013-06-07 ENCOUNTER — Other Ambulatory Visit: Payer: Self-pay | Admitting: Diagnostic Neuroimaging

## 2013-06-15 ENCOUNTER — Encounter (HOSPITAL_COMMUNITY): Payer: Self-pay | Admitting: Emergency Medicine

## 2013-06-15 ENCOUNTER — Inpatient Hospital Stay (HOSPITAL_COMMUNITY)
Admission: EM | Admit: 2013-06-15 | Discharge: 2013-06-16 | DRG: 101 | Disposition: A | Discharge status: Home / Self Care | Payer: 59 | Attending: Internal Medicine | Admitting: Internal Medicine

## 2013-06-15 DIAGNOSIS — E872 Acidosis, unspecified: Secondary | ICD-10-CM

## 2013-06-15 DIAGNOSIS — D72829 Elevated white blood cell count, unspecified: Secondary | ICD-10-CM

## 2013-06-15 DIAGNOSIS — G40909 Epilepsy, unspecified, not intractable, without status epilepticus: Principal | ICD-10-CM | POA: Diagnosis present

## 2013-06-15 DIAGNOSIS — I4892 Unspecified atrial flutter: Secondary | ICD-10-CM | POA: Diagnosis present

## 2013-06-15 DIAGNOSIS — R569 Unspecified convulsions: Secondary | ICD-10-CM

## 2013-06-15 DIAGNOSIS — Z79899 Other long term (current) drug therapy: Secondary | ICD-10-CM

## 2013-06-15 DIAGNOSIS — G809 Cerebral palsy, unspecified: Secondary | ICD-10-CM | POA: Diagnosis present

## 2013-06-15 LAB — URINE MICROSCOPIC-ADD ON

## 2013-06-15 LAB — URINALYSIS, ROUTINE W REFLEX MICROSCOPIC
Glucose, UA: NEGATIVE mg/dL
Leukocytes, UA: NEGATIVE
Protein, ur: 30 mg/dL — AB
Specific Gravity, Urine: 1.02 (ref 1.005–1.030)
Urobilinogen, UA: 0.2 mg/dL (ref 0.0–1.0)
pH: 5 (ref 5.0–8.0)

## 2013-06-15 LAB — BASIC METABOLIC PANEL
BUN: 12 mg/dL (ref 6–23)
CO2: 8 mEq/L — CL (ref 19–32)
Chloride: 99 mEq/L (ref 96–112)
GFR calc non Af Amer: 61 mL/min — ABNORMAL LOW (ref >90)
Glucose, Bld: 118 mg/dL — ABNORMAL HIGH (ref 70–99)
Potassium: 3.8 mEq/L (ref 3.5–5.1)
Sodium: 140 mEq/L (ref 135–145)

## 2013-06-15 LAB — BLOOD GAS, ARTERIAL
Acid-base deficit: 7.5 mmol/L — ABNORMAL HIGH (ref 0.0–2.0)
Bicarbonate: 18 meq/L — ABNORMAL LOW (ref 20.0–24.0)
Drawn by: 331471
O2 Saturation: 92.7 %
Patient temperature: 98.6
TCO2: 15.9 mmol/L (ref 0–100)
pCO2 arterial: 37.8 mmHg (ref 35.0–45.0)
pH, Arterial: 7.299 — ABNORMAL LOW (ref 7.350–7.450)
pO2, Arterial: 74.1 mmHg — ABNORMAL LOW (ref 80.0–100.0)

## 2013-06-15 LAB — CBC
HCT: 50.9 % (ref 39.0–52.0)
Hemoglobin: 17.1 g/dL — ABNORMAL HIGH (ref 13.0–17.0)
MCH: 32.6 pg (ref 26.0–34.0)
MCHC: 33.6 g/dL (ref 30.0–36.0)
MCV: 97.1 fL (ref 78.0–100.0)
Platelets: 267 K/uL (ref 150–400)
RBC: 5.24 MIL/uL (ref 4.22–5.81)
RDW: 12.8 % (ref 11.5–15.5)
WBC: 21.4 K/uL — ABNORMAL HIGH (ref 4.0–10.5)

## 2013-06-15 LAB — GLUCOSE, CAPILLARY: Glucose-Capillary: 104 mg/dL — ABNORMAL HIGH (ref 70–99)

## 2013-06-15 LAB — LACTIC ACID, PLASMA: Lactic Acid, Venous: 3.5 mmol/L — ABNORMAL HIGH (ref 0.5–2.2)

## 2013-06-15 LAB — MRSA PCR SCREENING: MRSA by PCR: NEGATIVE

## 2013-06-15 LAB — CARBAMAZEPINE LEVEL, TOTAL: Carbamazepine Lvl: 0.7 ug/mL — ABNORMAL LOW (ref 4.0–12.0)

## 2013-06-15 MED ORDER — SODIUM CHLORIDE 0.9 % IV SOLN
INTRAVENOUS | Status: DC
  Administered 2013-06-15 – 2013-06-16 (×2): via INTRAVENOUS

## 2013-06-15 MED ORDER — ONDANSETRON HCL 4 MG/2ML IJ SOLN: 4.0000 mg | Freq: Four times a day (QID) | INTRAMUSCULAR | Status: DC | PRN

## 2013-06-15 MED ORDER — ACETAMINOPHEN 325 MG PO TABS: 650.0000 mg | ORAL_TABLET | Freq: Four times a day (QID) | ORAL | Status: DC | PRN

## 2013-06-15 MED ORDER — ACETAMINOPHEN 650 MG RE SUPP: 650.0000 mg | Freq: Four times a day (QID) | RECTAL | Status: DC | PRN

## 2013-06-15 MED ORDER — LORAZEPAM 2 MG/ML IJ SOLN
1.0000 mg | Freq: Once | INTRAMUSCULAR | Status: AC
  Administered 2013-06-15: 1 mg via INTRAVENOUS

## 2013-06-15 MED ORDER — SODIUM CHLORIDE 0.9 % IV SOLN
1000.0000 mg | Freq: Once | INTRAVENOUS | Status: AC
  Administered 2013-06-15: 1000 mg via INTRAVENOUS
  Filled 2013-06-15: qty 10

## 2013-06-15 MED ORDER — CARBAMAZEPINE ER 200 MG PO TB12
200.0000 mg | ORAL_TABLET | Freq: Once | ORAL | Status: AC
  Administered 2013-06-16: 200 mg via ORAL
  Filled 2013-06-15: qty 1

## 2013-06-15 MED ORDER — SENNOSIDES-DOCUSATE SODIUM 8.6-50 MG PO TABS: 1.0000 | ORAL_TABLET | Freq: Every evening | ORAL | Status: DC | PRN

## 2013-06-15 MED ORDER — ONDANSETRON HCL 4 MG PO TABS: 4.0000 mg | ORAL_TABLET | Freq: Four times a day (QID) | ORAL | Status: DC | PRN

## 2013-06-15 MED ORDER — ENOXAPARIN SODIUM 40 MG/0.4ML ~~LOC~~ SOLN
40.0000 mg | SUBCUTANEOUS | Status: DC
  Administered 2013-06-15: 40 mg via SUBCUTANEOUS
  Filled 2013-06-15 (×2): qty 0.4

## 2013-06-15 MED ORDER — CARBAMAZEPINE ER 200 MG PO TB12
200.0000 mg | ORAL_TABLET | Freq: Once | ORAL | Status: AC
  Administered 2013-06-15: 200 mg via ORAL
  Filled 2013-06-15: qty 1

## 2013-06-15 MED ORDER — LORAZEPAM 2 MG/ML IJ SOLN: 2.0000 mg | INTRAMUSCULAR | Status: DC | PRN

## 2013-06-15 MED ORDER — LEVETIRACETAM 500 MG PO TABS
500.0000 mg | ORAL_TABLET | Freq: Two times a day (BID) | ORAL | Status: DC
  Administered 2013-06-15 – 2013-06-16 (×2): 500 mg via ORAL
  Filled 2013-06-15 (×3): qty 1

## 2013-06-15 MED ORDER — SODIUM CHLORIDE 0.9 % IV BOLUS (SEPSIS)
1000.0000 mL | Freq: Once | INTRAVENOUS | Status: AC
  Administered 2013-06-15: 1000 mL via INTRAVENOUS

## 2013-06-15 MED ORDER — LORAZEPAM 2 MG/ML IJ SOLN
INTRAMUSCULAR | Status: AC
  Filled 2013-06-15: qty 1

## 2013-06-15 MED ORDER — LORAZEPAM 2 MG/ML IJ SOLN: 1.0000 mg | INTRAMUSCULAR | Status: DC | PRN

## 2013-06-15 MED ORDER — CARBAMAZEPINE ER 200 MG PO TB12
200.0000 mg | ORAL_TABLET | Freq: Two times a day (BID) | ORAL | Status: DC
  Administered 2013-06-15 – 2013-06-16 (×2): 200 mg via ORAL
  Filled 2013-06-15 (×3): qty 1

## 2013-06-15 MED ORDER — SODIUM CHLORIDE 0.9 % IV SOLN: INTRAVENOUS | Status: DC

## 2013-06-15 NOTE — ED Notes (Signed)
Critical lab value CO2 8.  Made Sierra RN aware.
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
## 2013-06-15 NOTE — Progress Notes (Signed)
Patient too lethargic to sign up for My Chart at current time. Briscoe Burns BSN, RN-BC Admissions RN  06/15/2013 3:40 PM

## 2013-06-15 NOTE — H&P (Signed)
[facility]          History and Physical    PCP:   Alva Garnet., MD   Chief Complaint:  Seizure x 3  HPI: 22 y/o young man with a h/o epilepsy. Has not had any seizure activity in 3 years. History is provided by his mother (nurse tech in the ED) as the patient is still a little sleepy. Mother relates patient has not had his seizure medications in 2 days because the pharmacy only gave her a partial prescription, and despite multiple visits to the pharmacy, they were unable to refill the prescription without a callback from the neurologist. Today mother witnessed 2 distinct tonic-clonic seizures at home prompting her to call 911. He had a third seizure in the ED that abated with ativan. He was loaded with keppra. We are asked to admit him as he is very acidotic and still lethargic.  Allergies:  No Known Allergies    Past Medical History  Diagnosis Date  . Seizures   . Cerebral palsy     mild  . Paroxysmal atrial flutter     brief, post sz on 09/22/11  . 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 Epilepsy     Past Surgical History  Procedure Laterality Date  . Acrnoid cyst      Prior to Admission medications   Medication Sig Start Date End Date Taking? Authorizing Provider  carbamazepine (CARBATROL) 200 MG 12 hr capsule TAKE 2 CAPSULES BY MOUTH TWICE DAILY 06/07/13  Yes Suanne Marker, MD    Social History:  reports that he has never smoked. He has never used smokeless tobacco. He reports that he drinks alcohol. He reports that he does not use illicit drugs.  History reviewed. No pertinent family history.  Review of Systems:  Unable to obtain given current mental state.  Physical Exam: Blood pressure 127/57, pulse 51, temperature 99.2 F (37.3 C), temperature source Oral, resp. rate 15, height 6\' 4"  (1.93 m), SpO2 94.00%. Gen: arouses briefly to voice and can answer simple questions. HEENT: [place]/AT/PERRL/EOMI/moist mucous  membranes. Neck: supple, no JVD, no LAD, no bruits, no goiter. CV: RRR, no M/R/G Lungs: CTA B Abd: S/NT/ND/+BS/no masses or organomegaly noted. Ext: no C/C/E, +pulses. Neuro: sedated, moves all 4 spontaneously.   Labs on Admission:  Results for orders placed during the hospital encounter of 06/15/13 (from the past 48 hour(s))  GLUCOSE, CAPILLARY     Status: Abnormal   Collection Time    06/15/13 11:54 AM      Result Value Range   Glucose-Capillary 104 (*) 70 - 99 mg/dL   Comment 1 Notify RN    CBC     Status: Abnormal   Collection Time    06/15/13 12:05 PM      Result Value Range   WBC 21.4 (*) 4.0 - 10.5 K/uL   RBC 5.24  4.22 - 5.81 MIL/uL   Hemoglobin 17.1 (*) 13.0 - 17.0 g/dL   HCT 19.1  47.8 - 29.5 %   MCV 97.1  78.0 - 100.0 fL   MCH 32.6  26.0 - 34.0 pg   MCHC 33.6  30.0 - 36.0 g/dL   RDW 62.1  30.8 - 65.7 %   Platelets 267  150 - 400 K/uL  BASIC METABOLIC PANEL     Status: Abnormal   Collection Time    06/15/13 12:05 PM      Result Value Range   Sodium 140  135 - 145 mEq/L   Comment: REPEATED TO VERIFY   Potassium 3.8  3.5 - 5.1 mEq/L   Chloride 99  96 - 112 mEq/L   Comment: REPEATED TO VERIFY   CO2 8 (*) 19 - 32 mEq/L   Comment: REPEATED TO VERIFY     CRITICAL RESULT CALLED TO, READ BACK BY AND VERIFIED WITH:     C. HALL RN AT 1304 ON 10.22.14 BY SHUEA   Glucose, Bld 118 (*) 70 - 99 mg/dL   BUN 12  6 - 23 mg/dL   Creatinine, Ser 1.61 (*) 0.50 - 1.35 mg/dL   Calcium 09.6  8.4 - 04.5 mg/dL   GFR calc non Af Amer 61 (*) >90 mL/min   GFR calc Af Amer 71 (*) >90 mL/min   Comment: (NOTE)     The eGFR has been calculated using the CKD EPI equation.     This calculation has not been validated in all clinical situations.     eGFR's persistently <90 mL/min signify possible Chronic Kidney     Disease.  URINALYSIS, ROUTINE W REFLEX MICROSCOPIC     Status: Abnormal   Collection Time    06/15/13 12:16 PM      Result Value Range   Color, Urine YELLOW  YELLOW    APPearance CLEAR  CLEAR   Specific Gravity, Urine 1.020  1.005 - 1.030   pH 5.0  5.0 - 8.0   Glucose, UA NEGATIVE  NEGATIVE mg/dL   Hgb urine dipstick MODERATE (*) NEGATIVE   Bilirubin Urine NEGATIVE  NEGATIVE   Ketones, ur NEGATIVE  NEGATIVE mg/dL   Protein, ur 30 (*) NEGATIVE mg/dL   Urobilinogen, UA 0.2  0.0 - 1.0 mg/dL   Nitrite NEGATIVE  NEGATIVE   Leukocytes, UA NEGATIVE  NEGATIVE  URINE MICROSCOPIC-ADD ON     Status: Abnormal   Collection Time    06/15/13 12:16 PM      Result Value Range   RBC / HPF 3-6  <3 RBC/hpf   Bacteria, UA FEW (*) RARE   Urine-Other MUCOUS PRESENT    CARBAMAZEPINE LEVEL, TOTAL     Status: Abnormal   Collection Time    06/15/13 12:22 PM      Result Value Range   Carbamazepine Lvl 0.7 (*) 4.0 - 12.0 ug/mL   Comment: Performed at [facility]  BLOOD GAS, ARTERIAL     Status: Abnormal   Collection Time    06/15/13  1:43 PM      Result Value Range   O2 Content ROOM AIR     pH, Arterial 7.299 (*) 7.350 - 7.450   pCO2 arterial 37.8  35.0 - 45.0 mmHg   pO2, Arterial 74.1 (*) 80.0 - 100.0 mmHg   Bicarbonate 18.0 (*) 20.0 - 24.0 mEq/L   TCO2 15.9  0 - 100 mmol/L   Acid-base deficit 7.5 (*) 0.0 - 2.0 mmol/L   O2 Saturation 92.7     Patient temperature 98.6     Collection site RIGHT RADIAL     Drawn by [id number]     Sample type ARTERIAL DRAW     Allens test (pass/fail) PASS  PASS  SALICYLATE LEVEL     Status: Abnormal   Collection Time    06/15/13  2:30 PM      Result Value Range   Salicylate Lvl <2.0 (*) 2.8 - 20.0 mg/dL  LACTIC ACID, PLASMA     Status: Abnormal   Collection Time    06/15/13  2:30 PM      Result Value Range   Lactic Acid, Venous 3.5 (*) 0.5 - 2.2 mmol/L    Radiological Exams on Admission: No results found.  Assessment/Plan Principal Problem:   Convulsions/seizures Active Problems:   Metabolic acidosis   Leukocytosis, unspecified   Seizures -Has been seizure-free for over 3 years on same dose of tegretol and  keppra. -Has been loaded on keppra. -Will continue home doses of seizure meds. -Neuro consult has been requested.  Metabolic Acidosis -from lactic acidosis. -Suspect as a result from the seizure. -No signs of infection. -IVF and recheck BMET in am.  Leukocytosis -Reactive from seizure. -Recheck in am.  Time Spent on Admission: 65 minutes  HERNANDEZ ACOSTA,Latika Kronick [facility] Pager: [phone] 06/15/2013, 4:34 PM

## 2013-06-15 NOTE — Progress Notes (Signed)
[facility] consulted by admission RN for medication assitance.  EDCM spoke to patient at bedside.  Patient is drowsy.  Patient reports he does not have a problem paying for his medications.  Patient has aproblem getting his prescriptions to his pharmacy per consult.  EDCM qestioned patient why getting his prescriptions to the pharmacy was a problem, patient shrugged his shoulders.  Patient reports he has not seen his neurologist in a while. EDCM instructed patient to see his neurologist and have his neurologist send his prescriptions to his pharmacy.  Patient fell asleep.  Patient needs reinforcement.

## 2013-06-15 NOTE — Consult Note (Addendum)
Reason for Consult:Seizures Referring Physician: Philip Aspen  CC: Seizures  HPI: Ricky Humphrey is an 22 y.o. male with a history of seizures since the age of 38 felt to be secondary to an arachnoid cyst that was drained but seizures continued.  Has been controlled on Tegretol but has had some prescription issues and was unable to obtain Tegretol for his Sunday night, Monday and Tuesday morning dosing.  Was able to take his does last night and this morning.  Today was noted to have 3 seizures.  Patient now admitted to ICU.  Past Medical History  Diagnosis Date  . Seizures   . Cerebral palsy     mild  . Paroxysmal atrial flutter     brief, post sz on 09/22/11  . 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 Epilepsy     Past Surgical History  Procedure Laterality Date  . Acrnoid cyst      Family history: No family history of seizures  Social History:  reports that he has never smoked. He has never used smokeless tobacco. He reports that he drinks alcohol. He reports that he does not use illicit drugs.  No Known Allergies  Medications:  I have reviewed the patient's current medications. Prior to Admission:  Prescriptions prior to admission  Medication Sig Dispense Refill  . carbamazepine (CARBATROL) 200 MG 12 hr capsule TAKE 2 CAPSULES BY MOUTH TWICE DAILY  120 capsule  0   Scheduled: . carbamazepine  200 mg Oral BID  . enoxaparin (LOVENOX) injection  40 mg Subcutaneous Q24H  . levETIRAcetam  500 mg Oral BID    ROS: History obtained from mother  General ROS: negative for - chills, fatigue, fever, night sweats, weight gain or weight loss Psychological ROS: negative for - behavioral disorder, hallucinations, memory difficulties, mood swings or suicidal ideation Ophthalmic ROS: negative for - blurry vision, double vision, eye pain or loss of vision ENT ROS: negative for - epistaxis, nasal discharge, oral lesions, sore throat, tinnitus or vertigo Allergy and  Immunology ROS: negative for - hives or itchy/watery eyes Hematological and Lymphatic ROS: negative for - bleeding problems, bruising or swollen lymph nodes Endocrine ROS: negative for - galactorrhea, hair pattern changes, polydipsia/polyuria or temperature intolerance Respiratory ROS: negative for - cough, hemoptysis, shortness of breath or wheezing Cardiovascular ROS: negative for - chest pain, dyspnea on exertion, edema or irregular heartbeat Gastrointestinal ROS: negative for - abdominal pain, diarrhea, hematemesis, nausea/vomiting or stool incontinence Genito-Urinary ROS: negative for - dysuria, hematuria, incontinence or urinary frequency/urgency Musculoskeletal ROS: negative for - joint swelling or muscular weakness Neurological ROS: as noted in HPI Dermatological ROS: negative for rash and skin lesion changes  Physical Examination: Blood pressure 127/57, pulse 51, temperature 99.2 F (37.3 C), temperature source Oral, resp. rate 15, height 6\' 4"  (1.93 m), weight 90.9 kg (200 lb 6.4 oz), SpO2 94.00%.  Neurologic Examination Mental Status: Lethargic, oriented, thought content appropriate.  Speech fluent without evidence of aphasia.  Able to follow 3 step commands without difficulty. Cranial Nerves: II: Discs flat bilaterally; Visual fields grossly normal, pupils equal, round, reactive to light and accommodation III,IV, VI: ptosis not present, extra-ocular motions intact bilaterally V,VII: decrease in the left NLF, facial light touch sensation normal bilaterally VIII: hearing normal bilaterally IX,X: gag reflex present XI: bilateral shoulder shrug XII: midline tongue extension Motor: Right : Upper extremity   5/5    Left:     Upper extremity   5/5  Lower extremity  5/5     Lower extremity   5/5 Tone and bulk:normal tone throughout; no atrophy noted Sensory: Pinprick and light touch intact throughout, bilaterally Deep Tendon Reflexes: 2+ and symmetric throughout Plantars: Right:  upgoing   Left: upgoing Cerebellar: normal finger-to-nose and normal heel-to-shin test Gait: Unable to test CV: pulses palpable throughout    Laboratory Studies:   Basic Metabolic Panel:  Recent Labs Lab 06/15/13 1205  NA 140  K 3.8  CL 99  CO2 8*  GLUCOSE 118*  BUN 12  CREATININE 1.58*  CALCIUM 10.5    Liver Function Tests: No results found for this basename: AST, ALT, ALKPHOS, BILITOT, PROT, ALBUMIN,  in the last 168 hours No results found for this basename: LIPASE, AMYLASE,  in the last 168 hours No results found for this basename: AMMONIA,  in the last 168 hours  CBC:  Recent Labs Lab 06/15/13 1205  WBC 21.4*  HGB 17.1*  HCT 50.9  MCV 97.1  PLT 267    Cardiac Enzymes: No results found for this basename: CKTOTAL, CKMB, CKMBINDEX, TROPONINI,  in the last 168 hours  BNP: No components found with this basename: POCBNP,   CBG:  Recent Labs Lab 06/15/13 1154  GLUCAP 104*    Microbiology: Results for orders placed during the hospital encounter of 10/01/11  STREP A DNA PROBE     Status: None   Collection Time    10/01/11  8:02 PM      Result Value Range Status   Specimen Description THROAT   Final   Special Requests NONE   Final   Group A Strep Probe NEGATIVE   Final   Report Status 10/02/2011 FINAL   Final    Coagulation Studies: No results found for this basename: LABPROT, INR,  in the last 72 hours  Urinalysis:  Recent Labs Lab 06/15/13 1216  COLORURINE YELLOW  LABSPEC 1.020  PHURINE 5.0  GLUCOSEU NEGATIVE  HGBUR MODERATE*  BILIRUBINUR NEGATIVE  KETONESUR NEGATIVE  PROTEINUR 30*  UROBILINOGEN 0.2  NITRITE NEGATIVE  LEUKOCYTESUR NEGATIVE    Lipid Panel:  No results found for this basename: chol, trig, hdl, cholhdl, vldl, ldlcalc    HgbA1C:  No results found for this basename: HGBA1C    Urine Drug Screen:     Component Value Date/Time   LABOPIA NONE DETECTED 09/22/2011 2328   COCAINSCRNUR NONE DETECTED 09/22/2011 2328    LABBENZ NONE DETECTED 09/22/2011 2328   AMPHETMU NONE DETECTED 09/22/2011 2328   THCU NONE DETECTED 09/22/2011 2328   LABBARB NONE DETECTED 09/22/2011 2328    Alcohol Level: No results found for this basename: ETH,  in the last 168 hours  Other results: EKG: normal sinus rhythm at 90 bpm.  Imaging: No results found.   Assessment/Plan: 22 year old male presenting after multiple seizures.  Patient has a history of seizures and has not been able to take multiple doses prior to this presentation secondary to pharmacy issues.  Patient on Tegretol at home.  Unfortunately, Tegretol has no IV preparation or mechanism for po load.    Recommendations: 1.  To help Tegretol doses therapeutic as quickly as possible would give an extra dose of Tegretol tonight and tomorrow morning.   2.  May continue Keppra until Tegretol is therapeutic with discontinuation once therapeutic.   3.  Seizure precautions 4.  Ativan prn  Thana Farr, MD [facility] [phone] 06/15/2013, 5:53 PM

## 2013-06-15 NOTE — ED Provider Notes (Signed)
TIME SEEN: 12:00 PM  CHIEF COMPLAINT: Seizures  HPI: Patient is a 22 year old male with a history of seizures, cerebral palsy who presents emergency department with 2 seizures today. Patient is currently on Keppra and Tegretol but has been off of these medications since Sunday, 3 days ago. He restarted his medications today. Mother reports that his seizure started off as partial seizures with involvement of the right upper extremity and then become generalized. No history of head injury. No recent or alcohol use. No recent infectious symptoms.  ROS: Unobtainable as patient is postictal  PAST MEDICAL HISTORY/PAST SURGICAL HISTORY:  Past Medical History  Diagnosis Date  . Seizures   . Cerebral palsy     mild  . Paroxysmal atrial flutter     brief, post sz on 09/22/11  . 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 Epilepsy     MEDICATIONS:  Prior to Admission medications   Medication Sig Start Date End Date Taking? Authorizing Provider  carbamazepine (CARBATROL) 200 MG 12 hr capsule TAKE 2 CAPSULES BY MOUTH TWICE DAILY 06/07/13  Yes Suanne Marker, MD    ALLERGIES:  No Known Allergies  SOCIAL HISTORY:  History  Substance Use Topics  . Smoking status: Never Smoker   . Smokeless tobacco: Never Used  . Alcohol Use: Yes     Comment: occasional    FAMILY HISTORY: History reviewed. No pertinent family history.  EXAM: Pulse 93  Temp(Src) 98.1 F (36.7 C) (Oral)  Resp 20  SpO2 95% CONSTITUTIONAL: Patient is postictal, protecting his airway, moves all 4 extremity spontaneously but does not answer questions appropriately or follow commands HEAD: Normocephalic EYES: Conjunctivae clear, PERRL ENT: normal nose; no rhinorrhea; moist mucous membranes; pharynx without lesions noted NECK: Supple, no meningismus, no LAD  CARD: RRR; S1 and S2 appreciated; no murmurs, no clicks, no rubs, no gallops RESP: Normal chest excursion without splinting or tachypnea; breath sounds clear  and equal bilaterally; no wheezes, no rhonchi, no rales,  ABD/GI: Normal bowel sounds; non-distended; soft, non-tender, no rebound, no guarding BACK:  The back appears normal and is non-tender to palpation, there is no CVA tenderness EXT: Normal ROM in all joints; non-tender to palpation; no edema; normal capillary refill; no cyanosis    SKIN: Normal color for age and race; warm NEURO: Moves all extremities equally, no facial droop PSYCH: The patient's mood and manner are appropriate. Grooming and personal hygiene are appropriate.  MEDICAL DECISION MAKING: Patient with 3 tonic-clonic seizures likely from being off medication for 3 days. His last seizure prior to this for 6 months ago. No signs of trauma on exam. His glucose was normal. Will give IV Keppra. Will obtain labs, urine. We'll continue to monitor.  ED PROGRESS: Patient's labs show a leukocytosis of 21.4 with left shift. Suspect that this is reactive in nature from 3 seizures today. He has a bicarbonate on his chemistry of 8. Will verify with ABG. We'll give IV fluids. Urine shows no sign of infection but does have a moderate amount of blood.   Pt is still postictal. He has anion gap of 33, metabolic acidosis. Will add on salicylate level, lactate. Glucose is normal. No uremia. No history of ingestion or infectious symptoms.  Patient's PCP is at [facility]. His neurologist is at [facility] neurology.  Spoke with hospitalist for admission to step down.  Updated patient's family who agrees with plan.  Layla Maw Ward, DO 06/15/13 1453

## 2013-06-15 NOTE — ED Notes (Signed)
Bed: WA06 Expected date:  Expected time:  Means of arrival:  Comments: seizure 

## 2013-06-16 LAB — CBC
HCT: 38.7 % — ABNORMAL LOW (ref 39.0–52.0)
MCH: 31.9 pg (ref 26.0–34.0)
MCHC: 35.1 g/dL (ref 30.0–36.0)
RBC: 4.27 MIL/uL (ref 4.22–5.81)
RDW: 13 % (ref 11.5–15.5)

## 2013-06-16 LAB — BASIC METABOLIC PANEL
BUN: 11 mg/dL (ref 6–23)
Chloride: 106 mEq/L (ref 96–112)
Creatinine, Ser: 1.7 mg/dL — ABNORMAL HIGH (ref 0.50–1.35)
GFR calc Af Amer: 65 mL/min — ABNORMAL LOW (ref >90)
GFR calc non Af Amer: 56 mL/min — ABNORMAL LOW (ref >90)
Glucose, Bld: 89 mg/dL (ref 70–99)

## 2013-06-16 MED ORDER — LEVETIRACETAM 500 MG PO TABS: 500.0000 mg | ORAL_TABLET | Freq: Two times a day (BID) | ORAL | Status: DC

## 2013-06-16 NOTE — Discharge Summary (Signed)
Physician Discharge Summary  Ulrich Soules UJW:[id number] DOB: 10/21/1990 DOA: 06/15/2013  PCP: Alva Garnet., MD  Admit date: 06/15/2013 Discharge date: 06/16/2013  Time spent: 45 minutes  Recommendations for Outpatient Follow-up:   -Has follow up already scheduled with neurology for next week.  Discharge Diagnoses:  Principal Problem:   Convulsions/seizures Active Problems:   Metabolic acidosis   Leukocytosis, unspecified   Discharge Condition: Stable and improved  Filed Weights   06/15/13 1610  Weight: 90.9 kg (200 lb 6.4 oz)    History of present illness:  22 y/o young man with a h/o epilepsy. Has not had any seizure activity in 3 years. History is provided by his mother (nurse tech in the ED) as the patient is still a little sleepy. Mother relates patient has not had his seizure medications in 2 days because the pharmacy only gave her a partial prescription, and despite multiple visits to the pharmacy, they were unable to refill the prescription without a callback from the neurologist. Today mother witnessed 2 distinct tonic-clonic seizures at home prompting her to call 911. He had a third seizure in the ED that abated with ativan. He was loaded with keppra. We were asked to admit him as he is very acidotic and still lethargic.   Hospital Course:  Seizure Disorder with Breakthrough Seizure Activity -Due to issues with pharmacy filling out medications. -Appreciate neurology input. -Continue home doses of tegretol and keppra. -Has received an extra tegretol dose last night and this morning to help bring him back to therapeutic levels as per neurology recommendations.  Metabolic Acidosis/Leukocytosis -2/2 seizure activity. -Resolved.  Procedures:  None   Consultations:  Neurology, Dr. Thad Ranger.  Discharge Instructions  Discharge Orders   Future Appointments Provider Department Dept Phone   06/20/2013 2:00 PM Suanne Marker, MD [facility] [phone]   Future Orders Complete By Expires   Discontinue IV  As directed    Increase activity slowly  As directed        Medication List         carbamazepine 200 MG 12 hr capsule  Commonly known as:  CARBATROL  TAKE 2 CAPSULES BY MOUTH TWICE DAILY     levETIRAcetam 500 MG tablet  Commonly known as:  KEPPRA  Take 1 tablet (500 mg total) by mouth 2 (two) times daily.       No Known Allergies     Follow-up Information   Follow up with Joycelyn Schmid, MD. (as scheduled for next week)    Specialties:  Neurology, Radiology   Contact information:   [address] [place] [address] [phone]        The results of significant diagnostics from this hospitalization (including imaging, microbiology, ancillary and laboratory) are listed below for reference.    Significant Diagnostic Studies: No results found.  Microbiology: Recent Results (from the past 240 hour(s))  MRSA PCR SCREENING     Status: None   Collection Time    06/15/13  6:25 PM      Result Value Range Status   MRSA by PCR NEGATIVE  NEGATIVE Final   Comment:            The GeneXpert MRSA Assay (FDA     approved for NASAL specimens     only), is one component of a     comprehensive MRSA colonization     surveillance program. It is not     intended to diagnose MRSA     infection nor  to guide or     monitor treatment for     MRSA infections.     Labs: Basic Metabolic Panel:  Recent Labs Lab 06/15/13 1205 06/16/13 0320  NA 140 136  K 3.8 3.8  CL 99 106  CO2 8* 21  GLUCOSE 118* 89  BUN 12 11  CREATININE 1.58* 1.70*  CALCIUM 10.5 8.6   Liver Function Tests: No results found for this basename: AST, ALT, ALKPHOS, BILITOT, PROT, ALBUMIN,  in the last 168 hours No results found for this basename: LIPASE, AMYLASE,  in the last 168 hours No results found for this basename: AMMONIA,  in the last 168 hours CBC:  Recent Labs Lab 06/15/13 1205  06/16/13 0320  WBC 21.4* 13.3*  HGB 17.1* 13.6  HCT 50.9 38.7*  MCV 97.1 90.6  PLT 267 217   Cardiac Enzymes: No results found for this basename: CKTOTAL, CKMB, CKMBINDEX, TROPONINI,  in the last 168 hours BNP: BNP (last 3 results) No results found for this basename: PROBNP,  in the last 8760 hours CBG:  Recent Labs Lab 06/15/13 1154  GLUCAP 104*       Signed:  HERNANDEZ ACOSTA,ESTELA  [facility] Pager: [phone] 06/16/2013, 8:34 AM

## 2013-06-16 NOTE — Progress Notes (Signed)
Met with patient and mother Armed forces operational officer) to discuss the Link to [employer] program for [employer] with [employer]. Mom and patient state there are no Link to Wellness needs at this time. Reports follow up MD appointment is on Monday 06/20/13. Declines the need for a post hospital discharge call. Left contact information and Link to Wellness brochure at bedside for mother in case there are any needs in the future. Appreciative of visit. Raiford Noble, MSN- Ed, RN,BSN- [employer] [phone]

## 2013-06-20 ENCOUNTER — Ambulatory Visit (INDEPENDENT_AMBULATORY_CARE_PROVIDER_SITE_OTHER): Payer: 59 | Admitting: Diagnostic Neuroimaging

## 2013-06-20 ENCOUNTER — Telehealth: Payer: Self-pay

## 2013-06-20 ENCOUNTER — Encounter: Payer: Self-pay | Admitting: Diagnostic Neuroimaging

## 2013-06-20 VITALS — BP 126/85 | HR 69 | Temp 98.2°F | Ht 76.0 in | Wt 202.0 lb

## 2013-06-20 DIAGNOSIS — R569 Unspecified convulsions: Secondary | ICD-10-CM

## 2013-06-20 MED ORDER — LEVETIRACETAM 750 MG PO TABS: 1500.0000 mg | ORAL_TABLET | Freq: Two times a day (BID) | ORAL | Status: DC

## 2013-06-20 MED ORDER — CARBAMAZEPINE ER 200 MG PO CP12: 400.0000 mg | ORAL_CAPSULE | Freq: Two times a day (BID) | ORAL | Status: DC

## 2013-06-20 NOTE — Telephone Encounter (Signed)
Spoke to patient. Scheduled for 2:30 appt today rather than 2:00. Patient agreed.

## 2013-06-20 NOTE — Progress Notes (Signed)
[facility]  PATIENT: Ricky Humphrey DOB: [birth year]  REFERRING CLINICIAN:  HISTORY FROM: patient and mother REASON FOR VISIT:  Follow up   HISTORICAL  CHIEF COMPLAINT:  Chief Complaint  Patient presents with  . Follow-up    Epilepsy    HISTORY OF PRESENT ILLNESS:   UPDATE 06/20/13: Since last visit, patient was doing well until breakthrough seizure  on 06/15/13. Apparently his pharmacy changed supplier of Carbatrol to a different manufacturer and did not give patient full refill of medication. Patient ended up missing 3 successive doses of Carbatrol and then had multiple back-to-back seizures without return of consciousness. Patient was admitted to the hospital, monitored in the intensive care unit, discharged one day later. Since that time patient has returned to baseline. He still feels a little but sore, especially his tongue. Patient acknowledges missing intermittent doses of medication over the past one year, but usually without any consequence. Patient continues to have problems with insomnia and shifted sleep cycle. He typically goes to sleep at 3 AM and wakes up at noon or 2 PM. He drinks alcohol on social basis.  UPDATE 03/02/12: Doing about the same. No new seizures. Tolerating meds. Asking about driving.   UPDATE 11/19/11: On 09/23/11, pt admitted for breakthrough sz. He had run out of carbatrol, and level was undetectable when checked. Also dx'd with aflutter and has followed up with Dr. Jacinto Halim. No further events.  PRIOR HPI: 22 year old left-handed male with history of mild cerebral palsy, here for evaluation of seizure disorder.  Patient is product of full-term gestation, 18 hour labor, attempted vaginal delivery with foreceps, with emergent c-section.  Patient developed normally from cognitive standpoint. He had mild right-sided incoordination.  Neuroimaging study demonstrated left brain cyst, and patient was diagnosed with cerebral  palsy.  Patient had no further problems until age 74 years old, when he was freshman in high school, when he had a new onset generalized convulsive seizure with tongue biting and incontinence at school. He went to the emergency room and a second seizure in the CT scan. He started on Keppra.  He continued to have seizures once per week.  Due to the presence of left brain cyst, he underwent left brain craniotomy, with unsuccessful cyst resection.  Then Carbatrol was added to his regimen. His seizure frequency declined to once every 6-9 months.  He had breakthrough seizure in April 2012 with concomitant strep infection.  Most recent breakthrough seizure was 2 days ago on 07/27/11.  Nowadays typical seizures consist of right arm posturing and convulsions, followed by loss of consciousness.  REVIEW OF SYSTEMS: Full 14 system review of systems performed and notable only for insomnia restless legs headaches seizure.  ALLERGIES: No Known Allergies  HOME MEDICATIONS: Outpatient Prescriptions Prior to Visit  Medication Sig Dispense Refill  . carbamazepine (CARBATROL) 200 MG 12 hr capsule TAKE 2 CAPSULES BY MOUTH TWICE DAILY  120 capsule  0  . levETIRAcetam (KEPPRA) 500 MG tablet Take 1 tablet (500 mg total) by mouth 2 (two) times daily.       No facility-administered medications prior to visit.    PAST MEDICAL HISTORY: Past Medical History  Diagnosis Date  . Seizures   . Cerebral palsy     mild  . Paroxysmal atrial flutter     brief, post sz on 09/22/11  . 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 Epilepsy     PAST SURGICAL HISTORY: Past Surgical History  Procedure Laterality Date  .  Acrnoid cyst      FAMILY HISTORY: Family History  Problem Relation Age of Onset  . Migraines Mother   . Diabetes Mother     SOCIAL HISTORY:  History   Social History  . Marital Status: Single    Spouse Name: N/A    Number of Children: 0  . Years of Education: College   Occupational  History  . Student    Social History Main Topics  . Smoking status: Never Smoker   . Smokeless tobacco: Never Used  . Alcohol Use: Yes     Comment: occasional  . Drug Use: No  . Sexual Activity: Yes   Other Topics Concern  . Not on file   Social History Narrative   Patient lives with his girlfriend.   Archivist at [employer]    Caffeine Use: 2-3 cups daily     PHYSICAL EXAM  Filed Vitals:   06/20/13 1454  BP: 126/85  Pulse: 69  Temp: 98.2 F (36.8 C)  TempSrc: Oral  Height: 6\' 4"  (1.93 m)  Weight: 202 lb (91.627 kg)    Not recorded    Body mass index is 24.6 kg/(m^2).  GENERAL EXAM: Patient is in no distress  CARDIOVASCULAR: Regular rate and rhythm, no murmurs, no carotid bruits  NEUROLOGIC: MENTAL STATUS: awake, alert, language fluent, comprehension intact, naming intact CRANIAL NERVE: pupils equal and reactive to light, visual fields full to confrontation, extraocular muscles intact, no nystagmus, facial sensation and strength symmetric, uvula midline, shoulder shrug symmetric, tongue midline. MOTOR: normal bulk and tone, full strength in the BUE, BLE SENSORY: normal and symmetric to light touch, pinprick, temperature, vibration COORDINATION: finger-nose-finger, fine finger movements normal; SLOW IN RUE.  REFLEXES: deep tendon reflexes present and symmetric GAIT/STATION: narrow based gait; DIFF WITH TANDEM. Romberg is negative   DIAGNOSTIC DATA (LABS, IMAGING, TESTING) - I reviewed patient records, labs, notes, testing and imaging myself where available.  Lab Results  Component Value Date   WBC 13.3* 06/16/2013   HGB 13.6 06/16/2013   HCT 38.7* 06/16/2013   MCV 90.6 06/16/2013   PLT 217 06/16/2013      Component Value Date/Time   NA 136 06/16/2013 0320   K 3.8 06/16/2013 0320   CL 106 06/16/2013 0320   CO2 21 06/16/2013 0320   GLUCOSE 89 06/16/2013 0320   BUN 11 06/16/2013 0320   CREATININE 1.70* 06/16/2013 0320   CALCIUM 8.6 06/16/2013  0320   PROT 6.6 09/23/2011 0525   ALBUMIN 3.2* 09/23/2011 0525   AST 19 09/23/2011 0525   ALT 22 09/23/2011 0525   ALKPHOS 77 09/23/2011 0525   BILITOT 0.2* 09/23/2011 0525   GFRNONAA 56* 06/16/2013 0320   GFRAA 65* 06/16/2013 0320   No results found for this basename: CHOL, HDL, LDLCALC, LDLDIRECT, TRIG, CHOLHDL   No results found for this basename: HGBA1C   No results found for this basename: VITAMINB12   Lab Results  Component Value Date   TSH 1.167 09/22/2011   I reviewed images myself and agree with interpretation.   09/22/11 CT head: Prior left temporal craniotomy.  Large CSF attenuation collection extra-axial in left parietal  region, consistent with arachnoid cyst.  Few linear areas of slightly higher attenuation are seen within the  anterior aspect of this collection, question septations, uncertain  etiology; consider either comparison to prior outside exams to  assess stability or further evaluation by MR imaging with and  without contrast to assess.    ASSESSMENT AND PLAN  22 y.o.  left-handed male with mild cerebral palsy and complex partial seizures with secondary generalization.  Patient having breakthrough seizures every 6-9 months. More recently, this was in setting of missed doses of medication (pharmacy refill issues).  PLAN: 1. Increase LEV to 1500mg  BID 2. Continue CBZ XR 400mg  BID 3. No driving 4. Seizure precautions reviewed   Return in about 4 months (around 10/21/2013) for with Edison Nasuti, MD 06/20/2013, 4:07 PM Certified in Neurology, Neurophysiology and Neuroimaging  [facility] [address] [place], [place] [address] [phone]

## 2013-11-01 ENCOUNTER — Ambulatory Visit: Payer: 59 | Admitting: Diagnostic Neuroimaging

## 2014-06-29 ENCOUNTER — Telehealth: Payer: Self-pay | Admitting: Diagnostic Neuroimaging

## 2014-06-29 MED ORDER — CARBAMAZEPINE ER 200 MG PO CP12: 400.0000 mg | ORAL_CAPSULE | Freq: Two times a day (BID) | ORAL | Status: DC

## 2014-06-29 MED ORDER — LEVETIRACETAM 750 MG PO TABS: 1500.0000 mg | ORAL_TABLET | Freq: Two times a day (BID) | ORAL | Status: DC

## 2014-06-29 NOTE — Telephone Encounter (Signed)
Rx's have been sent.  I called back three times.  Each time the phone would ring three times, then I would get a busy signal.  Tried alternate number listed and message said it was temporarily out of service.

## 2014-06-29 NOTE — Telephone Encounter (Signed)
Mother requesting Rx refill for levETIRAcetam (KEPPRA) 750 MG tablet and  carbamazepine (CARBATROL) 200 MG 12 hr capsule.  Please call and advise.

## 2014-07-10 ENCOUNTER — Ambulatory Visit (INDEPENDENT_AMBULATORY_CARE_PROVIDER_SITE_OTHER): Payer: 59 | Admitting: Emergency Medicine

## 2014-07-10 VITALS — BP 108/74 | HR 68 | Temp 98.6°F | Ht 75.0 in | Wt 212.8 lb

## 2014-07-10 DIAGNOSIS — R05 Cough: Secondary | ICD-10-CM

## 2014-07-10 DIAGNOSIS — R059 Cough, unspecified: Secondary | ICD-10-CM

## 2014-07-10 DIAGNOSIS — J0100 Acute maxillary sinusitis, unspecified: Secondary | ICD-10-CM

## 2014-07-10 DIAGNOSIS — J209 Acute bronchitis, unspecified: Secondary | ICD-10-CM

## 2014-07-10 MED ORDER — ALBUTEROL SULFATE HFA 108 (90 BASE) MCG/ACT IN AERS: 2.0000 | INHALATION_SPRAY | RESPIRATORY_TRACT | Status: DC | PRN

## 2014-07-10 MED ORDER — ALBUTEROL SULFATE (2.5 MG/3ML) 0.083% IN NEBU
2.5000 mg | INHALATION_SOLUTION | Freq: Once | RESPIRATORY_TRACT | Status: AC
  Administered 2014-07-10: 2.5 mg via RESPIRATORY_TRACT

## 2014-07-10 MED ORDER — IPRATROPIUM BROMIDE 0.02 % IN SOLN
0.5000 mg | Freq: Once | RESPIRATORY_TRACT | Status: AC
Start: 2014-07-10 — End: 2014-07-10
  Administered 2014-07-10: 0.5 mg via RESPIRATORY_TRACT

## 2014-07-10 MED ORDER — PSEUDOEPHEDRINE-GUAIFENESIN ER 60-600 MG PO TB12: 1.0000 | ORAL_TABLET | Freq: Two times a day (BID) | ORAL | Status: DC

## 2014-07-10 MED ORDER — PROMETHAZINE-CODEINE 6.25-10 MG/5ML PO SYRP: 5.0000 mL | ORAL_SOLUTION | Freq: Four times a day (QID) | ORAL | Status: DC | PRN

## 2014-07-10 MED ORDER — AMOXICILLIN-POT CLAVULANATE 875-125 MG PO TABS: 1.0000 | ORAL_TABLET | Freq: Two times a day (BID) | ORAL | Status: DC

## 2014-07-10 NOTE — Progress Notes (Deleted)
   Subjective:    Patient ID: Ricky Humphrey, male    DOB: 06-22-1991, 23 y.o.   MRN: [id number]  HPI    Review of Systems     Objective:   Physical Exam        Assessment & Plan:

## 2014-07-10 NOTE — Progress Notes (Signed)
[facility] [address], [place] [id number] [phone]  Date:  07/10/2014   Name:  Ricky Humphrey Aprea   DOB:  1991/04/06   MRN:  [id number]  PCP:  Alva GarnetSHELTON,KIMBERLY R., MD    Chief Complaint: Sore Throat; Cough; and Shortness of Breath   History of Present Illness:  Ricky Humphrey is a 23 y.o. very pleasant male patient who presents with the following:  Ill a couple days with a fever and cough.  No history of asthma but wheezing.  Cough is productive of purulent sputum Nasal congestion and post nasal drip is purulent.  Cough worse at night. Fever to 101.  Some chills. Sore throat Anorectic No nausea or vomiting No improvement with over the counter medications or other home remedies.  Denies other complaint or health concern today.   Patient Active Problem List   Diagnosis Date Noted  . Convulsions/seizures 06/15/2013  . Atrial flutter 09/23/2011    Past Medical History  Diagnosis Date  . Seizures   . Cerebral palsy     mild  . Paroxysmal atrial flutter     brief, post sz on 09/22/11  . 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 Epilepsy     Past Surgical History  Procedure Laterality Date  . Acrnoid cyst      History  Substance Use Topics  . Smoking status: Never Smoker   . Smokeless tobacco: Never Used  . Alcohol Use: 0.0 oz/week    0 Not specified per week     Comment: occasional    Family History  Problem Relation Age of Onset  . Migraines Mother   . Diabetes Mother     No Known Allergies  Medication list has been reviewed and updated.  Current Outpatient Prescriptions on File Prior to Visit  Medication Sig Dispense Refill  . carbamazepine (CARBATROL) 200 MG 12 hr capsule Take 2 capsules (400 mg total) by mouth 2 (two) times daily. 120 capsule 0  . levETIRAcetam (KEPPRA) 750 MG tablet Take 2 tablets (1,500 mg total) by mouth 2 (two) times daily. 120 tablet 0   No current facility-administered  medications on file prior to visit.    Review of Systems:  As per HPI, otherwise negative.    Physical Examination: Filed Vitals:   07/10/14 1354  BP: 108/74  Pulse: 68  Temp: 98.6 F (37 C)   Filed Vitals:   07/10/14 1354  Height: 6\' 3"  (1.905 m)  Weight: 212 lb 12.8 oz (96.525 kg)   Body mass index is 26.6 kg/(m^2). Ideal Body Weight: Weight in (lb) to have BMI = 25: 199.6  GEN: WDWN, moderately ill, Non-toxic, A & O x 3 persistently coughing HEENT: Atraumatic, Normocephalic. Neck supple. No masses, No LAD. Ears and Nose: No external deformity. CV: RRR, No M/G/R. No JVD. No thrill. No extra heart sounds. PULM: CTA B, no wheezes, crackles, rhonchi. No retractions. No resp. distress. No accessory muscle use. ABD: S, NT, ND, +BS. No rebound. No HSM. EXTR: No c/c/e NEURO Normal gait.  PSYCH: Normally interactive. Conversant. Not depressed or anxious appearing.  Calm demeanor.    Assessment and Plan: Sinusitis Bronchitis augmentin mucinex d prometh  Signed,  Phillips OdorJeffery Michaiah Holsopple, MD

## 2014-07-10 NOTE — Patient Instructions (Signed)
Metered Dose Inhaler (No Spacer Used)  Inhaled medicines are the basis of treatment for asthma and other breathing problems. Inhaled medicine can only be effective if used properly. Good technique assures that the medicine reaches the lungs.  Metered dose inhalers (MDIs) are used to deliver a variety of inhaled medicines. These include quick relief or rescue medicines (such as bronchodilators) and controller medicines (such as corticosteroids). The medicine is delivered by pushing down on a metal canister to release a set amount of spray.  If you are using different kinds of inhalers, use your quick relief medicine to open the airways 10-15 minutes before using a steroid, if instructed to do so by your health care provider. If you are unsure which inhalers to use and the order of using them, ask your health care provider, nurse, or respiratory therapist.  HOW TO USE THE INHALER  1. Remove the cap from the inhaler.  2. If you are using the inhaler for the first time, you will need to prime it. Shake the inhaler for 5 seconds and release four puffs into the air, away from your face. Ask your health care provider or pharmacist if you have questions about priming your inhaler.  3. Shake the inhaler for 5 seconds before each breath in (inhalation).  4. Position the inhaler so that the top of the canister faces up.  5. Put your index finger on the top of the medicine canister. Your thumb supports the bottom of the inhaler.  6. Open your mouth.  7. Either place the inhaler between your teeth and place your lips tightly around the mouthpiece, or hold the inhaler 1-2 inches away from your open mouth. If you are unsure of which technique to use, ask your health care provider.  8. Breathe out (exhale) normally and as completely as possible.  9. Press the canister down with the index finger to release the medicine.  10. At the same time as the canister is pressed, inhale deeply and slowly until your lungs are completely filled.  This should take 4-6 seconds. Keep your tongue down.  11. Hold the medicine in your lungs for 5-10 seconds (10 seconds is best). This helps the medicine get into the small airways of your lungs.  12. Breathe out slowly, through pursed lips. Whistling is an example of pursed lips.  13. Wait at least 1 minute between puffs. Continue with the above steps until you have taken the number of puffs your health care provider has ordered. Do not use the inhaler more than your health care provider directs you to.  14. Replace the cap on the inhaler.  15. Follow the directions from your health care provider or the inhaler insert for cleaning the inhaler.  If you are using a steroid inhaler, after your last puff, rinse your mouth with water, gargle, and spit out the water. Do not swallow the water.  AVOID:  · Inhaling before or after starting the spray of medicine. It takes practice to coordinate your breathing with triggering the spray.  · Inhaling through the nose (rather than the mouth) when triggering the spray.  HOW TO DETERMINE IF YOUR INHALER IS FULL OR NEARLY EMPTY  You cannot know when an inhaler is empty by shaking it. Some inhalers are now being made with dose counters. Ask your health care provider for a prescription that has a dose counter if you feel you need that extra help. If your inhaler does not have a counter, ask your health care   provider to help you determine the date you need to refill your inhaler. Write the refill date on a calendar or your inhaler canister. Refill your inhaler 7-10 days before it runs out. Be sure to keep an adequate supply of medicine. This includes making sure it has not expired, and making sure you have a spare inhaler.  SEEK MEDICAL CARE IF:  · Symptoms are only partially relieved with your inhaler.  · You are having trouble using your inhaler.  · You experience an increase in phlegm.  SEEK IMMEDIATE MEDICAL CARE IF:  · You feel little or no relief with your inhalers. You are still  wheezing and feeling shortness of breath, tightness in your chest, or both.  · You have dizziness, headaches, or a fast heart rate.  · You have chills, fever, or night sweats.  · There is a noticeable increase in phlegm production, or there is blood in the phlegm.  MAKE SURE YOU:  · Understand these instructions.  · Will watch your condition.  · Will get help right away if you are not doing well or get worse.  Document Released: 06/08/2007 Document Revised: 12/26/2013 Document Reviewed: 01/27/2013  [employer]® Patient Information ©2015 [employer]. This information is not intended to replace advice given to you by your health care provider. Make sure you discuss any questions you have with your health care provider.

## 2014-07-12 ENCOUNTER — Telehealth: Payer: Self-pay | Admitting: Diagnostic Neuroimaging

## 2014-07-12 NOTE — Telephone Encounter (Signed)
Patient's mother calling to state that she was told her son's appointment was going to be this Friday 07/14/14 but it is in the system for 07/17/14, please return call and advise, she states she cannot remember who it was that she spoke with.

## 2014-07-13 NOTE — Telephone Encounter (Signed)
Returned mother's call. Phone number on file, not in service. #[phone]. Tried Hp# no answer.

## 2014-07-17 ENCOUNTER — Ambulatory Visit (INDEPENDENT_AMBULATORY_CARE_PROVIDER_SITE_OTHER): Payer: 59 | Admitting: Diagnostic Neuroimaging

## 2014-07-17 ENCOUNTER — Encounter: Payer: Self-pay | Admitting: Diagnostic Neuroimaging

## 2014-07-17 VITALS — BP 134/90 | HR 59 | Ht 75.5 in | Wt 223.2 lb

## 2014-07-17 DIAGNOSIS — G40909 Epilepsy, unspecified, not intractable, without status epilepticus: Secondary | ICD-10-CM

## 2014-07-17 MED ORDER — LEVETIRACETAM 750 MG PO TABS: 1500.0000 mg | ORAL_TABLET | Freq: Two times a day (BID) | ORAL | Status: DC

## 2014-07-17 MED ORDER — CARBAMAZEPINE ER 400 MG PO TB12: 400.0000 mg | ORAL_TABLET | Freq: Two times a day (BID) | ORAL | Status: DC

## 2014-07-17 NOTE — Patient Instructions (Signed)
Congratulations, you are doing well!  Continue your anti-seizure medications:  Meds ordered this encounter  Medications  . levETIRAcetam (KEPPRA) 750 MG tablet    Sig: Take 2 tablets (1,500 mg total) by mouth 2 (two) times daily.    Dispense:  360 tablet    Refill:  4  . carbamazepine (TEGRETOL XR) 400 MG 12 hr tablet    Sig: Take 1 tablet (400 mg total) by mouth 2 (two) times daily.    Dispense:  180 tablet    Refill:  4   In October 2016, we may consider your intention to apply for driver's license if you continue to be seizure free.

## 2014-07-17 NOTE — Progress Notes (Signed)
[facility]  PATIENT: Ricky PoliceChristopher Humphrey DOB: 1990-10-04  REFERRING CLINICIAN:  HISTORY FROM: patient and mother REASON FOR VISIT:  Follow up   HISTORICAL  CHIEF COMPLAINT:  Chief Complaint  Patient presents with  . Follow-up    HISTORY OF PRESENT ILLNESS:   UPDATE 07/17/14: Since last visit, doing well and no seizures. Asking about possibility of applying for driver's license. Tolerating medications. No new events of issues.   UPDATE 06/20/13: Since last visit, patient was doing well until breakthrough seizure  on 06/15/13. Apparently his pharmacy changed supplier of Carbatrol to a different manufacturer and did not give patient full refill of medication. Patient ended up missing 3 successive doses of Carbatrol and then had multiple back-to-back seizures without return of consciousness. Patient was admitted to the hospital, monitored in the intensive care unit, discharged one day later. Since that time patient has returned to baseline. He still feels a little but sore, especially his tongue. Patient acknowledges missing intermittent doses of medication over the past one year, but usually without any consequence. Patient continues to have problems with insomnia and shifted sleep cycle. He typically goes to sleep at 3 AM and wakes up at noon or 2 PM. He drinks alcohol on social basis.  UPDATE 03/02/12: Doing about the same. No new seizures. Tolerating meds. Asking about driving.   UPDATE 11/19/11: On 09/23/11, pt admitted for breakthrough sz. He had run out of carbatrol, and level was undetectable when checked. Also dx'd with aflutter and has followed up with Dr. Jacinto HalimGanji. No further events.  PRIOR HPI: 23 year old left-handed male with history of mild cerebral palsy, here for evaluation of seizure disorder. Patient is product of full-term gestation, 18 hour labor, attempted vaginal delivery with foreceps, with emergent c-section.  Patient developed normally from  cognitive standpoint. He had mild right-sided incoordination.  Neuroimaging study demonstrated left brain cyst, and patient was diagnosed with cerebral palsy. Patient had no further problems until age 23 years old, when he was freshman in high school, when he had a new onset generalized convulsive seizure with tongue biting and incontinence at school. He went to the emergency room and a second seizure in the CT scan. He started on Keppra.  He continued to have seizures once per week.  Due to the presence of left brain cyst, he underwent left brain craniotomy, with unsuccessful cyst resection.  Then Carbatrol was added to his regimen. His seizure frequency declined to once every 6-9 months. He had breakthrough seizure in April 2012 with concomitant strep infection. Most recent breakthrough seizure was 2 days ago on 07/27/11.  Nowadays typical seizures consist of right arm posturing and convulsions, followed by loss of consciousness.  REVIEW OF SYSTEMS: Full 14 system review of systems performed and notable only for as per HPI.    ALLERGIES: No Known Allergies  HOME MEDICATIONS: Outpatient Prescriptions Prior to Visit  Medication Sig Dispense Refill  . albuterol (PROVENTIL HFA;VENTOLIN HFA) 108 (90 BASE) MCG/ACT inhaler Inhale 2 puffs into the lungs every 4 (four) hours as needed for wheezing or shortness of breath (cough, shortness of breath or wheezing.). 1 Inhaler 1  . amoxicillin-clavulanate (AUGMENTIN) 875-125 MG per tablet Take 1 tablet by mouth 2 (two) times daily. 20 tablet 0  . promethazine-codeine (PHENERGAN WITH CODEINE) 6.25-10 MG/5ML syrup Take 5-10 mLs by mouth every 6 (six) hours as needed. 120 mL 0  . pseudoephedrine-guaifenesin (MUCINEX D) 60-600 MG per tablet Take 1 tablet by mouth every 12 (twelve) hours.  18 tablet 0  . carbamazepine (CARBATROL) 200 MG 12 hr capsule Take 2 capsules (400 mg total) by mouth 2 (two) times daily. 120 capsule 0  . levETIRAcetam (KEPPRA) 750 MG tablet Take  2 tablets (1,500 mg total) by mouth 2 (two) times daily. 120 tablet 0   No facility-administered medications prior to visit.    PAST MEDICAL HISTORY: Past Medical History  Diagnosis Date  . Seizures   . Cerebral palsy     mild  . Paroxysmal atrial flutter     brief, post sz on 09/22/11  . 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 Epilepsy     PAST SURGICAL HISTORY: Past Surgical History  Procedure Laterality Date  . Acrnoid cyst      FAMILY HISTORY: Family History  Problem Relation Age of Onset  . Migraines Mother   . Diabetes Mother     SOCIAL HISTORY:  History   Social History  . Marital Status: Single    Spouse Name: N/A    Number of Children: 0  . Years of Education: College   Occupational History  . Student    Social History Main Topics  . Smoking status: Never Smoker   . Smokeless tobacco: Never Used  . Alcohol Use: 0.0 oz/week    0 Not specified per week     Comment: occasional  . Drug Use: No  . Sexual Activity: Yes   Other Topics Concern  . Not on file   Social History Narrative   Patient lives with his girlfriend.   Archivist at [employer]    Caffeine Use: 2-3 cups daily     PHYSICAL EXAM  Filed Vitals:   07/17/14 1115  BP: 134/90  Pulse: 59  Height: 6' 3.5" (1.918 m)  Weight: 223 lb 3.2 oz (101.243 kg)    Not recorded      Body mass index is 27.52 kg/(m^2).  GENERAL EXAM: Patient is in no distress  CARDIOVASCULAR: Regular rate and rhythm, no murmurs, no carotid bruits  NEUROLOGIC: MENTAL STATUS: awake, alert, language fluent, comprehension intact, naming intact CRANIAL NERVE: pupils equal and reactive to light, visual fields full to confrontation, extraocular muscles intact, no nystagmus, facial sensation and strength symmetric, uvula midline, shoulder shrug symmetric, tongue midline. MOTOR: normal bulk and tone, full strength in the BUE, BLE SENSORY: normal and symmetric to light touch, pinprick, temperature,  vibration COORDINATION: finger-nose-finger, fine finger movements normal; SLOW IN RUE.  REFLEXES: deep tendon reflexes present and symmetric GAIT/STATION: narrow based gait; DIFF WITH TANDEM. Romberg is negative   DIAGNOSTIC DATA (LABS, IMAGING, TESTING) - I reviewed patient records, labs, notes, testing and imaging myself where available.  Lab Results  Component Value Date   WBC 13.3* 06/16/2013   HGB 13.6 06/16/2013   HCT 38.7* 06/16/2013   MCV 90.6 06/16/2013   PLT 217 06/16/2013      Component Value Date/Time   NA 136 06/16/2013 0320   K 3.8 06/16/2013 0320   CL 106 06/16/2013 0320   CO2 21 06/16/2013 0320   GLUCOSE 89 06/16/2013 0320   BUN 11 06/16/2013 0320   CREATININE 1.70* 06/16/2013 0320   CALCIUM 8.6 06/16/2013 0320   PROT 6.6 09/23/2011 0525   ALBUMIN 3.2* 09/23/2011 0525   AST 19 09/23/2011 0525   ALT 22 09/23/2011 0525   ALKPHOS 77 09/23/2011 0525   BILITOT 0.2* 09/23/2011 0525   GFRNONAA 56* 06/16/2013 0320   GFRAA 65* 06/16/2013 0320  No results found for: CHOL No results found for: HGBA1C No results found for: VITAMINB12 Lab Results  Component Value Date   TSH 1.167 09/22/2011    I reviewed images myself and agree with interpretation. -VRP   09/22/11 CT head - Prior left temporal craniotomy. Large CSF attenuation collection extra-axial in left parietal region, consistent with arachnoid cyst. Few linear areas of slightly higher attenuation are seen within the anterior aspect of this collection, question septations, uncertain etiology; consider either comparison to prior outside exams to assess stability or further evaluation by MR imaging with and without contrast to assess.    ASSESSMENT AND PLAN  23 y.o.  left-handed male with mild cerebral palsy and complex partial seizures with secondary generalization.  Patient was having breakthrough seizures every 6-9 months. Now has been sz free x 13 months with higher LEV dosing.   PLAN: 1. Continue LEV  1500mg  BID 2. Continue CBZ XR 400mg  BID 3. No driving; may reconsider if he makes it 2 years seizure free  Return in about 6 months (around 01/15/2015).    Suanne MarkerVIKRAM R. PENUMALLI, MD 07/17/2014, 11:40 AM Certified in Neurology, Neurophysiology and Neuroimaging  [facility] [address], [address] [place], [place] [id number] [phone]

## 2014-07-18 LAB — COMPREHENSIVE METABOLIC PANEL
A/G RATIO: 1.4 (ref 1.1–2.5)
ALBUMIN: 4 g/dL (ref 3.5–5.5)
ALK PHOS: 88 IU/L (ref 39–117)
ALT: 22 IU/L (ref 0–44)
AST: 27 IU/L (ref 0–40)
BILIRUBIN TOTAL: 0.2 mg/dL (ref 0.0–1.2)
BUN/Creatinine Ratio: 12 (ref 8–19)
BUN: 14 mg/dL (ref 6–20)
CO2: 27 mmol/L (ref 18–29)
CREATININE: 1.2 mg/dL (ref 0.76–1.27)
Calcium: 9.1 mg/dL (ref 8.7–10.2)
Chloride: 99 mmol/L (ref 97–108)
GFR calc non Af Amer: 85 mL/min/{1.73_m2} (ref >59)
GFR, EST AFRICAN AMERICAN: 99 mL/min/{1.73_m2} (ref >59)
GLOBULIN, TOTAL: 2.9 g/dL (ref 1.5–4.5)
Glucose: 68 mg/dL (ref 65–99)
Potassium: 4.8 mmol/L (ref 3.5–5.2)
SODIUM: 138 mmol/L (ref 134–144)
Total Protein: 6.9 g/dL (ref 6.0–8.5)

## 2014-07-18 LAB — CBC WITH DIFFERENTIAL
Basophils Absolute: 0 10*3/uL (ref 0.0–0.2)
Basos: 1 %
EOS: 7 %
Eosinophils Absolute: 0.5 10*3/uL — ABNORMAL HIGH (ref 0.0–0.4)
HCT: 45.4 % (ref 37.5–51.0)
Hemoglobin: 15.7 g/dL (ref 12.6–17.7)
IMMATURE GRANULOCYTES: 0 %
Immature Grans (Abs): 0 10*3/uL (ref 0.0–0.1)
Lymphocytes Absolute: 2.1 10*3/uL (ref 0.7–3.1)
Lymphs: 29 %
MCH: 31.5 pg (ref 26.6–33.0)
MCHC: 34.6 g/dL (ref 31.5–35.7)
MCV: 91 fL (ref 79–97)
MONOCYTES: 7 %
Monocytes Absolute: 0.5 10*3/uL (ref 0.1–0.9)
NEUTROS ABS: 4 10*3/uL (ref 1.4–7.0)
Neutrophils Relative %: 56 %
PLATELETS: 324 10*3/uL (ref 150–379)
RBC: 4.99 x10E6/uL (ref 4.14–5.80)
RDW: 12.9 % (ref 12.3–15.4)
WBC: 7.1 10*3/uL (ref 3.4–10.8)

## 2014-08-09 ENCOUNTER — Other Ambulatory Visit: Payer: Self-pay | Admitting: Diagnostic Neuroimaging

## 2014-08-14 NOTE — Telephone Encounter (Signed)
I spoke with Ricky Humphrey who verified the patient picked up the CBZ tabs on 12/17.  Says this request was sent in error.

## 2014-12-22 ENCOUNTER — Telehealth: Payer: Self-pay | Admitting: Diagnostic Neuroimaging

## 2014-12-22 NOTE — Telephone Encounter (Signed)
Patient called wanting to discuss with Dr. Marjory LiesPenumalli the availability for patient to drive. Please call and advice c/b [phone]

## 2014-12-22 NOTE — Telephone Encounter (Signed)
Called and spoke with the pt after reading the last office note and explained to the pt that Dr. Marjory LiesPenumalli wants him to be seizure free for two years in order to get a drivers license since he has breakthrough seizures q6-8 months. He stated he felt like it kept getting extended, and I explained that Dr. Marjory LiesPenumalli had increased the dose of keppra and wanted him to be seizure free until October of 2016 before he applied for a license. He stated an understanding

## 2015-01-15 ENCOUNTER — Ambulatory Visit: Payer: 59 | Admitting: Diagnostic Neuroimaging

## 2015-01-16 ENCOUNTER — Encounter: Payer: Self-pay | Admitting: Diagnostic Neuroimaging

## 2015-03-16 ENCOUNTER — Other Ambulatory Visit: Payer: Self-pay | Admitting: Neurology

## 2015-03-20 ENCOUNTER — Telehealth: Payer: Self-pay | Admitting: *Deleted

## 2015-03-20 NOTE — Telephone Encounter (Signed)
-----   Message from Suanne Marker, MD sent at 03/20/2015 11:59 AM EDT ----- Can you setup follow up appt so pt can get more refills?   -VRP  ----- Message -----    From: Asa Lente, MD    Sent: 03/16/2015  10:23 PM      To: Suanne Marker, MD  His mother called Friday night.   She did not get his med's filled in time at [facility] and they close until Monday.   I called in 5 days of Carabamazepine XR 400 bid and Levetiracetam 750, 2 po bid at [facility] [address]

## 2015-03-20 NOTE — Telephone Encounter (Signed)
Left vm requesting call back to schedule FU with Dr Marjory Lies in order to get more refills on patient's medications. Left this caller's name, number.

## 2015-03-21 NOTE — Telephone Encounter (Signed)
Spoke to patient who states he is out of town until Aug 19th. He requested late morning appointment. Rescheduled FU for Aug 29th, 11:15 am. He verbalized understanding, agreement.

## 2015-04-04 ENCOUNTER — Ambulatory Visit: Payer: 59 | Admitting: Diagnostic Neuroimaging

## 2015-04-23 ENCOUNTER — Ambulatory Visit (INDEPENDENT_AMBULATORY_CARE_PROVIDER_SITE_OTHER): Payer: 59 | Admitting: Diagnostic Neuroimaging

## 2015-04-23 ENCOUNTER — Encounter: Payer: Self-pay | Admitting: Diagnostic Neuroimaging

## 2015-04-23 VITALS — BP 110/67 | HR 76 | Ht 75.0 in | Wt 223.6 lb

## 2015-04-23 DIAGNOSIS — G40909 Epilepsy, unspecified, not intractable, without status epilepticus: Secondary | ICD-10-CM

## 2015-04-23 DIAGNOSIS — G40109 Localization-related (focal) (partial) symptomatic epilepsy and epileptic syndromes with simple partial seizures, not intractable, without status epilepticus: Secondary | ICD-10-CM | POA: Diagnosis not present

## 2015-04-23 MED ORDER — LEVETIRACETAM 750 MG PO TABS: 1500.0000 mg | ORAL_TABLET | Freq: Two times a day (BID) | ORAL | Status: DC

## 2015-04-23 MED ORDER — CARBAMAZEPINE ER 400 MG PO TB12: 400.0000 mg | ORAL_TABLET | Freq: Two times a day (BID) | ORAL | Status: DC

## 2015-04-23 NOTE — Patient Instructions (Signed)
Continue current medications. 

## 2015-04-23 NOTE — Progress Notes (Signed)
[facility]  PATIENT: Ricky Humphrey DOB: 08-Jan-1991  REFERRING CLINICIAN:  HISTORY FROM: patient and girlfriend REASON FOR VISIT:  Follow up   HISTORICAL  CHIEF COMPLAINT:  Chief Complaint  Patient presents with  . Seizures    rm 7, girl friend Tameka  . Follow-up    last seen 06/2014    HISTORY OF PRESENT ILLNESS:   UPDATE 04/23/15: Since last visit no sz. Last sz Oct 2014. Tolerating meds without issues. Very good compliance with medications.  UPDATE 07/17/14: Since last visit, doing well and no seizures. Asking about possibility of applying for driver's license. Tolerating medications. No new events of issues.   UPDATE 06/20/13: Since last visit, patient was doing well until breakthrough seizure on 06/15/13. Apparently his pharmacy changed supplier of Carbatrol to a different manufacturer and did not give patient full refill of medication. Patient ended up missing 3 successive doses of Carbatrol and then had multiple back-to-back seizures without return of consciousness. Patient was admitted to the hospital, monitored in the intensive care unit, discharged one day later. Since that time patient has returned to baseline. He still feels a little but sore, especially his tongue. Patient acknowledges missing intermittent doses of medication over the past one year, but usually without any consequence. Patient continues to have problems with insomnia and shifted sleep cycle. He typically goes to sleep at 3 AM and wakes up at noon or 2 PM. He drinks alcohol on social basis.  UPDATE 03/02/12: Doing about the same. No new seizures. Tolerating meds. Asking about driving.   UPDATE 11/19/11: On 09/23/11, pt admitted for breakthrough sz. He had run out of carbatrol, and level was undetectable when checked. Also dx'd with aflutter and has followed up with Dr. Jacinto Halim. No further events.  PRIOR HPI: 24 year old left-handed male with history of mild cerebral palsy,  here for evaluation of seizure disorder. Patient is product of full-term gestation, 18 hour labor, attempted vaginal delivery with foreceps, with emergent c-section.  Patient developed normally from cognitive standpoint. He had mild right-sided incoordination.  Neuroimaging study demonstrated left brain cyst, and patient was diagnosed with cerebral palsy. Patient had no further problems until age 34 years old, when he was freshman in high school, when he had a new onset generalized convulsive seizure with tongue biting and incontinence at school. He went to the emergency room and a second seizure in the CT scan. He started on Keppra.  He continued to have seizures once per week.  Due to the presence of left brain cyst, he underwent left brain craniotomy, with unsuccessful cyst resection.  Then Carbatrol was added to his regimen. His seizure frequency declined to once every 6-9 months. He had breakthrough seizure in April 2012 with concomitant strep infection. Most recent breakthrough seizure was 2 days ago on 07/27/11.  Nowadays typical seizures consist of right arm posturing and convulsions, followed by loss of consciousness.   REVIEW OF SYSTEMS: Full 14 system review of systems performed and notable only for as per HPI.    ALLERGIES: No Known Allergies  HOME MEDICATIONS: Outpatient Prescriptions Prior to Visit  Medication Sig Dispense Refill  . carbamazepine (TEGRETOL XR) 400 MG 12 hr tablet Take 1 tablet (400 mg total) by mouth 2 (two) times daily. 180 tablet 4  . levETIRAcetam (KEPPRA) 750 MG tablet Take 2 tablets (1,500 mg total) by mouth 2 (two) times daily. 360 tablet 4  . albuterol (PROVENTIL HFA;VENTOLIN HFA) 108 (90 BASE) MCG/ACT inhaler Inhale 2  puffs into the lungs every 4 (four) hours as needed for wheezing or shortness of breath (cough, shortness of breath or wheezing.). 1 Inhaler 1  . amoxicillin-clavulanate (AUGMENTIN) 875-125 MG per tablet Take 1 tablet by mouth 2 (two) times daily. 20  tablet 0  . promethazine-codeine (PHENERGAN WITH CODEINE) 6.25-10 MG/5ML syrup Take 5-10 mLs by mouth every 6 (six) hours as needed. 120 mL 0  . pseudoephedrine-guaifenesin (MUCINEX D) 60-600 MG per tablet Take 1 tablet by mouth every 12 (twelve) hours. 18 tablet 0   No facility-administered medications prior to visit.    PAST MEDICAL HISTORY: Past Medical History  Diagnosis Date  . Seizures   . Cerebral palsy     mild  . Paroxysmal atrial flutter     brief, post sz on 09/22/11  . 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 Epilepsy     PAST SURGICAL HISTORY: Past Surgical History  Procedure Laterality Date  . Acrnoid cyst      FAMILY HISTORY: Family History  Problem Relation Age of Onset  . Migraines Mother   . Diabetes Mother     SOCIAL HISTORY:  Social History   Social History  . Marital Status: Single    Spouse Name: N/A  . Number of Children: 0  . Years of Education: College   Occupational History  . Student    Social History Main Topics  . Smoking status: Never Smoker   . Smokeless tobacco: Never Used  . Alcohol Use: 0.0 oz/week    0 Standard drinks or equivalent per week     Comment: occasional  . Drug Use: No  . Sexual Activity: Yes   Other Topics Concern  . 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  GENERAL EXAM: Patient is in no distress  CARDIOVASCULAR: Regular rate and rhythm, no murmurs, no carotid bruits  NEUROLOGIC: MENTAL STATUS: awake, alert, language fluent, comprehension intact, naming intact CRANIAL NERVE: pupils equal and reactive to light, visual fields full to confrontation, extraocular muscles intact, no nystagmus, facial  sensation and strength symmetric, uvula midline, shoulder shrug symmetric, tongue midline. MOTOR: normal bulk and tone, full strength in the BUE, BLE SENSORY: normal and symmetric to light touch, pinprick, temperature, vibration COORDINATION: finger-nose-finger, fine finger movements normal; SLOW TAPPING IN RUE AND RLE.  REFLEXES: deep tendon reflexes present and symmetric GAIT/STATION: narrow based gait; DIFF WITH TANDEM. Romberg is negative    DIAGNOSTIC DATA (LABS, IMAGING, TESTING) - I reviewed patient records, labs, notes, testing and imaging myself where available.  Lab Results  Component Value Date   WBC 7.1 07/17/2014   HGB 15.7 07/17/2014   HCT 45.4 07/17/2014   MCV 91 07/17/2014   PLT 324 07/17/2014      Component Value Date/Time   NA 138 07/17/2014 1234   NA 136 06/16/2013 0320   K 4.8 07/17/2014 1234   CL 99 07/17/2014 1234   CO2 27 07/17/2014 1234   GLUCOSE 68 07/17/2014 1234   GLUCOSE 89 06/16/2013 0320   BUN 14 07/17/2014 1234   BUN 11  06/16/2013 0320   CREATININE 1.20 07/17/2014 1234   CALCIUM 9.1 07/17/2014 1234   PROT 6.9 07/17/2014 1234   PROT 6.6 09/23/2011 0525   ALBUMIN 3.2* 09/23/2011 0525   AST 27 07/17/2014 1234   ALT 22 07/17/2014 1234   ALKPHOS 88 07/17/2014 1234   BILITOT 0.2 07/17/2014 1234   GFRNONAA 85 07/17/2014 1234   GFRAA 99 07/17/2014 1234   No results found for: CHOL No results found for: HGBA1C No results found for: VITAMINB12 Lab Results  Component Value Date   TSH 1.167 09/22/2011    09/22/11 CT head - Prior left temporal craniotomy. Large CSF attenuation collection extra-axial in left parietal region, consistent with arachnoid cyst. Few linear areas of slightly higher attenuation are seen within the anterior aspect of this collection, question septations, uncertain etiology; consider either comparison to prior outside exams to assess stability or further evaluation by MR imaging with and without contrast to  assess.    ASSESSMENT AND PLAN  24 y.o.  left-handed male with mild cerebral palsy and complex partial seizures with secondary generalization.  Patient was having breakthrough seizures every 6-9 months. Now has been sz free since Oct 2014 or better dosing of anti-seizure medications.  PLAN: I spent 15 minutes of face to face time with patient. Greater than 50% of time was spent in counseling and coordination of care with patient. In summary we discussed: 1. Continue LEV 1500mg  BID 2. Continue CBZ XR 400mg  BID 3. May apply for driver's permit and license since he has not had seizure or aura since Oct 2014  Orders Placed This Encounter  Procedures  . CBC with Differential/Platelet  . Comprehensive metabolic panel   Meds ordered this encounter  Medications  . carbamazepine (TEGRETOL XR) 400 MG 12 hr tablet    Sig: Take 1 tablet (400 mg total) by mouth 2 (two) times daily.    Dispense:  180 tablet    Refill:  4  . levETIRAcetam (KEPPRA) 750 MG tablet    Sig: Take 2 tablets (1,500 mg total) by mouth 2 (two) times daily.    Dispense:  360 tablet    Refill:  4   Return in about 6 months (around 10/23/2015).    Suanne Marker, MD 04/23/2015, 11:52 AM Certified in Neurology, Neurophysiology and Neuroimaging  [facility] [address], [address] [place], [place] [address] [phone]

## 2015-04-24 LAB — COMPREHENSIVE METABOLIC PANEL
A/G RATIO: 1.4 (ref 1.1–2.5)
ALT: 20 IU/L (ref 0–44)
AST: 24 IU/L (ref 0–40)
Albumin: 3.9 g/dL (ref 3.5–5.5)
Alkaline Phosphatase: 71 IU/L (ref 39–117)
BILIRUBIN TOTAL: 0.3 mg/dL (ref 0.0–1.2)
BUN/Creatinine Ratio: 11 (ref 8–19)
BUN: 14 mg/dL (ref 6–20)
CHLORIDE: 102 mmol/L (ref 97–108)
CO2: 25 mmol/L (ref 18–29)
Calcium: 9 mg/dL (ref 8.7–10.2)
Creatinine, Ser: 1.26 mg/dL (ref 0.76–1.27)
GFR calc Af Amer: 92 mL/min/{1.73_m2} (ref >59)
GFR calc non Af Amer: 80 mL/min/{1.73_m2} (ref >59)
Globulin, Total: 2.8 g/dL (ref 1.5–4.5)
Glucose: 65 mg/dL (ref 65–99)
POTASSIUM: 4.2 mmol/L (ref 3.5–5.2)
Sodium: 141 mmol/L (ref 134–144)
Total Protein: 6.7 g/dL (ref 6.0–8.5)

## 2015-04-24 LAB — CBC WITH DIFFERENTIAL/PLATELET
Basophils Absolute: 0 10*3/uL (ref 0.0–0.2)
Basos: 0 %
EOS (ABSOLUTE): 0.4 10*3/uL (ref 0.0–0.4)
Eos: 6 %
Hematocrit: 43 % (ref 37.5–51.0)
Hemoglobin: 14.1 g/dL (ref 12.6–17.7)
IMMATURE GRANS (ABS): 0 10*3/uL (ref 0.0–0.1)
Immature Granulocytes: 0 %
LYMPHS ABS: 1.5 10*3/uL (ref 0.7–3.1)
LYMPHS: 27 %
MCH: 30.7 pg (ref 26.6–33.0)
MCHC: 32.8 g/dL (ref 31.5–35.7)
MCV: 94 fL (ref 79–97)
MONOS ABS: 0.5 10*3/uL (ref 0.1–0.9)
Monocytes: 8 %
NEUTROS ABS: 3.4 10*3/uL (ref 1.4–7.0)
Neutrophils: 59 %
PLATELETS: 243 10*3/uL (ref 150–379)
RBC: 4.59 x10E6/uL (ref 4.14–5.80)
RDW: 13.7 % (ref 12.3–15.4)
WBC: 5.8 10*3/uL (ref 3.4–10.8)

## 2015-04-25 ENCOUNTER — Telehealth: Payer: Self-pay | Admitting: *Deleted

## 2015-04-25 NOTE — Telephone Encounter (Signed)
Left vm for patient re:  Per Dr Marjory Lies lab results are normal. Left this caller's name, number for questions.

## 2015-04-25 NOTE — Telephone Encounter (Signed)
-----   Message from Suanne Marker, MD sent at 04/25/2015  8:31 AM EDT ----- Normal labs. pls call pt. -VRP

## 2015-07-08 ENCOUNTER — Emergency Department (HOSPITAL_COMMUNITY): Payer: 59

## 2015-07-08 ENCOUNTER — Emergency Department (HOSPITAL_COMMUNITY)
Admission: EM | Admit: 2015-07-08 | Discharge: 2015-07-09 | Disposition: A | Discharge status: Home / Self Care | Payer: 59 | Attending: Emergency Medicine | Admitting: Emergency Medicine

## 2015-07-08 ENCOUNTER — Encounter (HOSPITAL_COMMUNITY): Payer: Self-pay | Admitting: Emergency Medicine

## 2015-07-08 DIAGNOSIS — G40919 Epilepsy, unspecified, intractable, without status epilepticus: Secondary | ICD-10-CM

## 2015-07-08 DIAGNOSIS — Z8679 Personal history of other diseases of the circulatory system: Secondary | ICD-10-CM | POA: Diagnosis not present

## 2015-07-08 DIAGNOSIS — G40909 Epilepsy, unspecified, not intractable, without status epilepticus: Secondary | ICD-10-CM | POA: Insufficient documentation

## 2015-07-08 DIAGNOSIS — Z79899 Other long term (current) drug therapy: Secondary | ICD-10-CM | POA: Diagnosis not present

## 2015-07-08 LAB — BASIC METABOLIC PANEL
ANION GAP: 22 — AB (ref 5–15)
BUN: 11 mg/dL (ref 6–20)
CHLORIDE: 104 mmol/L (ref 101–111)
CO2: 11 mmol/L — ABNORMAL LOW (ref 22–32)
Calcium: 9.4 mg/dL (ref 8.9–10.3)
Creatinine, Ser: 1.54 mg/dL — ABNORMAL HIGH (ref 0.61–1.24)
GFR calc Af Amer: >60 mL/min (ref >60)
GFR calc non Af Amer: >60 mL/min (ref >60)
GLUCOSE: 108 mg/dL — AB (ref 65–99)
POTASSIUM: 3.8 mmol/L (ref 3.5–5.1)
SODIUM: 137 mmol/L (ref 135–145)

## 2015-07-08 LAB — CBG MONITORING, ED: Glucose-Capillary: 76 mg/dL (ref 65–99)

## 2015-07-08 LAB — CBC
HEMATOCRIT: 42.2 % (ref 39.0–52.0)
HEMOGLOBIN: 14.6 g/dL (ref 13.0–17.0)
MCH: 31.4 pg (ref 26.0–34.0)
MCHC: 34.6 g/dL (ref 30.0–36.0)
MCV: 90.8 fL (ref 78.0–100.0)
Platelets: 269 10*3/uL (ref 150–400)
RBC: 4.65 MIL/uL (ref 4.22–5.81)
RDW: 12 % (ref 11.5–15.5)
WBC: 19.5 10*3/uL — AB (ref 4.0–10.5)

## 2015-07-08 MED ORDER — ACETAMINOPHEN 500 MG PO TABS
1000.0000 mg | ORAL_TABLET | Freq: Once | ORAL | Status: AC
  Administered 2015-07-08: 1000 mg via ORAL
  Filled 2015-07-08: qty 2

## 2015-07-08 MED ORDER — SODIUM CHLORIDE 0.9 % IV BOLUS (SEPSIS)
1000.0000 mL | Freq: Once | INTRAVENOUS | Status: AC
  Administered 2015-07-08: 1000 mL via INTRAVENOUS

## 2015-07-08 MED ORDER — SODIUM CHLORIDE 0.9 % IV SOLN
1000.0000 mg | Freq: Once | INTRAVENOUS | Status: AC
  Administered 2015-07-08: 1000 mg via INTRAVENOUS
  Filled 2015-07-08: qty 10

## 2015-07-08 NOTE — ED Notes (Addendum)
Pt had witnessed 2 minute seizure (grand mal) with no recovery time. Had second seizure lasting approximately 45 seconds. Pt is lethargic but able to answer questions appropriately. No injury noted to tongue. No incontinence noted. Pt is diaphoretic. Takes Keppra and Tegretol. Has taken all medications as prescribed. No other c/c. Pupils size 3, equal, reactive to light. Pt having trouble focusing. Mother denies that there is a usual specific trigger. However, pt has been sick recently with nasal congestion/cough type symptoms.

## 2015-07-08 NOTE — ED Provider Notes (Signed)
CSN: [id number]     Arrival date & time 07/08/15  2034 History   First MD Initiated Contact with Patient 07/08/15 2113     No chief complaint on file.    (Consider location/radiation/quality/duration/timing/severity/associated sxs/prior Treatment) HPI  24 year old male with a history of epilepsy as well as mild cerebral palsy presents after having 2 back-to-back grand mal seizures. These occurred about 2 hours ago. First seizure last about 2 minutes, stopped seizing for about 1 minute, and then had another seizure lasting about 45 seconds. Last seizure before this was at least 6 months ago. Patient is on Tegretol and Keppra and has been compliant. Patient recently got over an upper respiratory infection, and no more cough or congestion. No recent fevers. Denies headache before the seizure but since waking up has been having a headache. Describes as frontal. No neck stiffness. No injury to the tongue. Mom states that patient typically gets headaches with the patient denies this and states his headache is new for him. There is no obvious injury. Patient is lethargic which is typical for him, usually he is "out of it" for 24 hours after a seizure.  Past Medical History  Diagnosis Date  . Seizures (HCC)   . Cerebral palsy (HCC)     mild  . Paroxysmal atrial flutter (HCC)     brief, post sz on 09/22/11  . 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 Epilepsy [facility](HCC)    Past Surgical History  Procedure Laterality Date  . Acrnoid cyst     Family History  Problem Relation Age of Onset  . Migraines Mother   . Diabetes Mother    Social History  Substance Use Topics  . Smoking status: Never Smoker   . Smokeless tobacco: Never Used  . Alcohol Use: 0.0 oz/week    0 Standard drinks or equivalent per week     Comment: occasional    Review of Systems  Constitutional: Negative for fever.  HENT: Negative for congestion.   Respiratory: Negative for cough and shortness of breath.    Gastrointestinal: Negative for vomiting.  Neurological: Positive for seizures and headaches. Negative for weakness.  All other systems reviewed and are negative.     Allergies  Review of patient's allergies indicates no known allergies.  Home Medications   Prior to Admission medications   Medication Sig Start Date End Date Taking? Authorizing Provider  carbamazepine (TEGRETOL XR) 400 MG 12 hr tablet Take 1 tablet (400 mg total) by mouth 2 (two) times daily. 04/23/15  Yes Suanne MarkerVikram R Penumalli, MD  levETIRAcetam (KEPPRA) 750 MG tablet Take 2 tablets (1,500 mg total) by mouth 2 (two) times daily. 04/23/15  Yes Vikram R Penumalli, MD   BP 129/60 mmHg  Pulse 72  Temp(Src) 98.1 F (36.7 C) (Oral)  Resp 19  SpO2 100% Physical Exam  Constitutional: He is oriented to person, place, and time. He appears well-developed and well-nourished. He appears lethargic.  HENT:  Head: Normocephalic and atraumatic.  Right Ear: External ear normal.  Left Ear: External ear normal.  Nose: Nose normal.  Eyes: EOM are normal. Pupils are equal, round, and reactive to light. Right eye exhibits no discharge. Left eye exhibits no discharge.  Neck: Neck supple.  No meningismus, normal ROM  Cardiovascular: Normal rate, regular rhythm, normal heart sounds and intact distal pulses.   Pulmonary/Chest: Effort normal and breath sounds normal.  Abdominal: Soft. There is no tenderness.  Musculoskeletal: He exhibits no edema.  Neurological:  He is oriented to person, place, and time. He appears lethargic. GCS eye subscore is 3. GCS verbal subscore is 5. GCS motor subscore is 6.  Sleepy but arousable. CN 2-12 grossly intact. 5/5 strength in all 4 extremities. Grossly normal sensation.  Skin: Skin is warm and dry.  Nursing note and vitals reviewed.   ED Course  Procedures (including critical care time) Labs Review Labs Reviewed  BASIC METABOLIC PANEL - Abnormal; Notable for the following:    CO2 11 (*)    Glucose,  Bld 108 (*)    Creatinine, Ser 1.54 (*)    Anion gap 22 (*)    All other components within normal limits  CBC - Abnormal; Notable for the following:    WBC 19.5 (*)    All other components within normal limits  BASIC METABOLIC PANEL  CBG MONITORING, ED    Imaging Review Ct Head Wo Contrast  07/08/2015  CLINICAL DATA:  Status post grand mal seizures. Lethargy and diaphoresis. Initial encounter. EXAM: CT HEAD WITHOUT CONTRAST TECHNIQUE: Contiguous axial images were obtained from the base of the skull through the vertex without intravenous contrast. COMPARISON:  CT of the head performed 09/22/2011 FINDINGS: There is no evidence of acute infarction, mass lesion, or intra- or extra-axial hemorrhage on CT. A large left-sided arachnoid cyst is again noted, measuring approximately 6.6 x 3.8 cm, relatively stable from the prior study. Minimally increased attenuation in the anterior aspect of the collection is also stable. There is mild asymmetry of the lateral ventricles, left larger than right, reflecting a normal variant. The brainstem and fourth ventricle are within normal limits. The basal ganglia are unremarkable in appearance. No midline shift is seen. There is no evidence of fracture; postoperative change is noted overlying the arachnoid cyst. The visualized portions of the orbits are within normal limits. The paranasal sinuses and mastoid air cells are well-aerated. No significant soft tissue abnormalities are seen. IMPRESSION: 1. No acute intracranial pathology seen on CT. 2. Large left-sided arachnoid cyst is stable in appearance, with overlying postoperative change. Electronically Signed   By: Roanna Raider M.D.   On: 07/08/2015 23:01   I have personally reviewed and evaluated these images and lab results as part of my medical decision-making.   EKG Interpretation None      MDM   Final diagnoses:  Breakthrough seizure [facility])    Patient with 2 back-to-back seizures. No seizure activity  while in the emergency department for several hours. Seems consistent with breakthrough seizures. He does not have any acute infection now although he did have an upper restaurant infection 1 week ago. No current signs of infection and no signs of meningismus. Has a headache but nothing this is most likely post ictal, however a CT was obtained as he states this is not typical for him. CT is unremarkable. Has a significant LV PC elevation but I think this is reactive from the seizure and stress response. He had a very similar presentation in 2014 where he was observed overnight no new issues were found. He was given IV Keppra. He has a metabolic acidosis that seems consistent with a seizure and likely lactic acidosis. Given the extent of it, he was given IV fluids and a BMP will be rechecked. Care to Dr. Preston Fleeting with BMP pending.    Pricilla Loveless, MD 07/09/15 [phone]

## 2015-07-08 NOTE — ED Notes (Addendum)
MD Goldston at bedside  

## 2015-07-08 NOTE — ED Notes (Signed)
Pt transported to CT ?

## 2015-07-08 NOTE — ED Notes (Signed)
MD Goldston at bedside  

## 2015-07-09 DIAGNOSIS — G40909 Epilepsy, unspecified, not intractable, without status epilepticus: Secondary | ICD-10-CM | POA: Diagnosis not present

## 2015-07-09 LAB — BASIC METABOLIC PANEL
ANION GAP: 3 — AB (ref 5–15)
BUN: 10 mg/dL (ref 6–20)
CHLORIDE: 109 mmol/L (ref 101–111)
CO2: 26 mmol/L (ref 22–32)
Calcium: 8.2 mg/dL — ABNORMAL LOW (ref 8.9–10.3)
Creatinine, Ser: 1.3 mg/dL — ABNORMAL HIGH (ref 0.61–1.24)
GFR calc Af Amer: >60 mL/min (ref >60)
GLUCOSE: 103 mg/dL — AB (ref 65–99)
POTASSIUM: 4 mmol/L (ref 3.5–5.1)
SODIUM: 138 mmol/L (ref 135–145)

## 2015-07-09 NOTE — ED Provider Notes (Signed)
Patient signed out by Dr. Criss Alvine to check basic metabolic panel following IV hydration. Initial metabolic panel showed metabolic acidosis which is felt to be most likely from his seizure. After hydration, metabolic panel shows normal electrolytes and he is discharged.  Results for orders placed or performed during the hospital encounter of 07/08/15  Basic metabolic panel - if new onset seizures  Result Value Ref Range   Sodium 137 135 - 145 mmol/L   Potassium 3.8 3.5 - 5.1 mmol/L   Chloride 104 101 - 111 mmol/L   CO2 11 (L) 22 - 32 mmol/L   Glucose, Bld 108 (H) 65 - 99 mg/dL   BUN 11 6 - 20 mg/dL   Creatinine, Ser 1.61 (H) 0.61 - 1.24 mg/dL   Calcium 9.4 8.9 - 09.6 mg/dL   GFR calc non Af Amer >60 >60 mL/min   GFR calc Af Amer >60 >60 mL/min   Anion gap 22 (H) 5 - 15  CBC  Result Value Ref Range   WBC 19.5 (H) 4.0 - 10.5 K/uL   RBC 4.65 4.22 - 5.81 MIL/uL   Hemoglobin 14.6 13.0 - 17.0 g/dL   HCT 04.5 40.9 - 81.1 %   MCV 90.8 78.0 - 100.0 fL   MCH 31.4 26.0 - 34.0 pg   MCHC 34.6 30.0 - 36.0 g/dL   RDW 91.4 78.2 - 95.6 %   Platelets 269 150 - 400 K/uL  Basic metabolic panel  Result Value Ref Range   Sodium 138 135 - 145 mmol/L   Potassium 4.0 3.5 - 5.1 mmol/L   Chloride 109 101 - 111 mmol/L   CO2 26 22 - 32 mmol/L   Glucose, Bld 103 (H) 65 - 99 mg/dL   BUN 10 6 - 20 mg/dL   Creatinine, Ser 2.13 (H) 0.61 - 1.24 mg/dL   Calcium 8.2 (L) 8.9 - 10.3 mg/dL   GFR calc non Af Amer >60 >60 mL/min   GFR calc Af Amer >60 >60 mL/min   Anion gap 3 (L) 5 - 15  CBG monitoring, ED  Result Value Ref Range   Glucose-Capillary 76 65 - 99 mg/dL   Comment 1 Notify RN    Comment 2 Document in Chart    Ct Head Wo Contrast  07/08/2015  CLINICAL DATA:  Status post grand mal seizures. Lethargy and diaphoresis. Initial encounter. EXAM: CT HEAD WITHOUT CONTRAST TECHNIQUE: Contiguous axial images were obtained from the base of the skull through the vertex without intravenous contrast. COMPARISON:   CT of the head performed 09/22/2011 FINDINGS: There is no evidence of acute infarction, mass lesion, or intra- or extra-axial hemorrhage on CT. A large left-sided arachnoid cyst is again noted, measuring approximately 6.6 x 3.8 cm, relatively stable from the prior study. Minimally increased attenuation in the anterior aspect of the collection is also stable. There is mild asymmetry of the lateral ventricles, left larger than right, reflecting a normal variant. The brainstem and fourth ventricle are within normal limits. The basal ganglia are unremarkable in appearance. No midline shift is seen. There is no evidence of fracture; postoperative change is noted overlying the arachnoid cyst. The visualized portions of the orbits are within normal limits. The paranasal sinuses and mastoid air cells are well-aerated. No significant soft tissue abnormalities are seen. IMPRESSION: 1. No acute intracranial pathology seen on CT. 2. Large left-sided arachnoid cyst is stable in appearance, with overlying postoperative change. Electronically Signed   By: Roanna Raider M.D.   On: 07/08/2015 23:01  Dione Boozeavid Timoty Bourke, MD 07/09/15 [phone]

## 2015-07-09 NOTE — ED Notes (Signed)
Patient is alert and oriented x3.  he was given DC instructions and follow up visit instructions.  Patient gave verbal understanding. he was DC ambulatory under her own power to home.  V/S stable.  He was not showing any signs of distress on DC

## 2015-08-02 ENCOUNTER — Ambulatory Visit (INDEPENDENT_AMBULATORY_CARE_PROVIDER_SITE_OTHER): Payer: 59 | Admitting: Emergency Medicine

## 2015-08-02 ENCOUNTER — Telehealth: Payer: Self-pay

## 2015-08-02 VITALS — BP 108/74 | HR 55 | Temp 98.4°F | Resp 16 | Ht 75.0 in | Wt 211.4 lb

## 2015-08-02 DIAGNOSIS — S335XXA Sprain of ligaments of lumbar spine, initial encounter: Secondary | ICD-10-CM

## 2015-08-02 MED ORDER — NAPROXEN SODIUM 550 MG PO TABS: 550.0000 mg | ORAL_TABLET | Freq: Two times a day (BID) | ORAL | Status: DC

## 2015-08-02 MED ORDER — TRAMADOL HCL 50 MG PO TABS: 50.0000 mg | ORAL_TABLET | Freq: Three times a day (TID) | ORAL | Status: DC | PRN

## 2015-08-02 MED ORDER — CYCLOBENZAPRINE HCL 10 MG PO TABS: 10.0000 mg | ORAL_TABLET | Freq: Three times a day (TID) | ORAL | Status: DC | PRN

## 2015-08-02 NOTE — Telephone Encounter (Signed)
Medical records does not keep forms that are dropped off.

## 2015-08-02 NOTE — Progress Notes (Signed)
Subjective:  Patient ID: Ricky Humphrey, male    DOB: 1991-04-08  Age: 24 y.o. MRN: [id number]  CC: Back Pain   HPI Ricky Humphrey presents  with acute low back pain. He works for [employer] as a several day history of pain in his low back. The pain is nonradiating and no neurologic symptoms no numbness tingling or weakness. He has no other complaint of injury. He denies any improvement with over-the-counter medication and rest.  History Ricky Humphrey has a past medical history of Seizures (HCC); Cerebral palsy (HCC); Paroxysmal atrial flutter (HCC); Arachnoid cyst; and Epilepsy (HCC).   He has past surgical history that includes acrnoid cyst.   His  family history includes Diabetes in his mother; Migraines in his mother.  He   reports that he has never smoked. He has never used smokeless tobacco. He reports that he drinks alcohol. He reports that he does not use illicit drugs.  Outpatient Prescriptions Prior to Visit  Medication Sig Dispense Refill  . carbamazepine (TEGRETOL XR) 400 MG 12 hr tablet Take 1 tablet (400 mg total) by mouth 2 (two) times daily. 180 tablet 4  . levETIRAcetam (KEPPRA) 750 MG tablet Take 2 tablets (1,500 mg total) by mouth 2 (two) times daily. 360 tablet 4   No facility-administered medications prior to visit.    Social History   Social History  . Marital Status: Single    Spouse Name: N/A  . Number of Children: 0  . Years of Education: College   Occupational History  . Student    Social History Main Topics  . Smoking status: Never Smoker   . Smokeless tobacco: Never Used  . Alcohol Use: 0.0 oz/week    0 Standard drinks or equivalent per week     Comment: occasional  . Drug Use: No  . Sexual Activity: Yes   Other Topics Concern  . None   Social History Narrative   Patient lives with his girlfriend.   Archivist at [employer]    Caffeine Use: 2-3 cups daily     Review of Systems  Constitutional: Negative for fever, chills  and appetite change.  HENT: Negative for congestion, ear pain, postnasal drip, sinus pressure and sore throat.   Eyes: Negative for pain and redness.  Respiratory: Negative for cough, shortness of breath and wheezing.   Cardiovascular: Negative for leg swelling.  Gastrointestinal: Negative for nausea, vomiting, abdominal pain, diarrhea, constipation and blood in stool.  Endocrine: Negative for polyuria.  Genitourinary: Negative for dysuria, urgency, frequency and flank pain.  Musculoskeletal: Positive for back pain. Negative for gait problem.  Skin: Negative for rash.  Neurological: Negative for weakness and headaches.  Psychiatric/Behavioral: Negative for confusion and decreased concentration. The patient is not nervous/anxious.     Objective:  BP 108/74 mmHg  Pulse 55  Temp(Src) 98.4 F (36.9 C) (Oral)  Resp 16  Ht  (1.905 m)  Wt 211 lb 6.4 oz (95.89 kg)  BMI 26.42 kg/m2  SpO2 97%  Physical Exam  Constitutional: He is oriented to person, place, and time. He appears well-developed and well-nourished.  HENT:  Head: Normocephalic and atraumatic.  Eyes: Conjunctivae are normal. Pupils are equal, round, and reactive to light.  Pulmonary/Chest: Effort normal.  Musculoskeletal: He exhibits no edema.       Lumbar back: He exhibits tenderness and spasm.  Neurological: He is alert and oriented to person, place, and time.  Skin: Skin is dry.  Psychiatric: He has a normal mood  and affect. His behavior is normal. Thought content normal.      Assessment & Plan:   Ricky Humphrey was seen today for back pain.  Diagnoses and all orders for this visit:  Sprain of lumbar region, initial encounter  Other orders -     naproxen sodium (ANAPROX DS) 550 MG tablet; Take 1 tablet (550 mg total) by mouth 2 (two) times daily with a meal. -     cyclobenzaprine (FLEXERIL) 10 MG tablet; Take 1 tablet (10 mg total) by mouth 3 (three) times daily as needed for muscle spasms. -     Discontinue:  traMADol (ULTRAM) 50 MG tablet; Take 1 tablet (50 mg total) by mouth every 8 (eight) hours as needed.   I have discontinued Mr. Ricky Humphrey's traMADol. I am also having him start on naproxen sodium and cyclobenzaprine. Additionally, I am having him maintain his carbamazepine and levETIRAcetam.  Meds ordered this encounter  Medications  . naproxen sodium (ANAPROX DS) 550 MG tablet    Sig: Take 1 tablet (550 mg total) by mouth 2 (two) times daily with a meal.    Dispense:  40 tablet    Refill:  0  . cyclobenzaprine (FLEXERIL) 10 MG tablet    Sig: Take 1 tablet (10 mg total) by mouth 3 (three) times daily as needed for muscle spasms.    Dispense:  30 tablet    Refill:  0  . DISCONTD: traMADol (ULTRAM) 50 MG tablet    Sig: Take 1 tablet (50 mg total) by mouth every 8 (eight) hours as needed.    Dispense:  30 tablet    Refill:  0    Appropriate red flag conditions were discussed with the patient as well as actions that should be taken.  Patient expressed his understanding.  Follow-up: Return if symptoms worsen or fail to improve.  Carmelina DaneAnderson, Malli Falotico S, MD

## 2015-08-02 NOTE — Patient Instructions (Signed)

## 2015-08-02 NOTE — Telephone Encounter (Signed)
Patient is looking for paperwork he dropped off on Sunday.  Nurses station??   [phone]

## 2015-08-02 NOTE — Telephone Encounter (Signed)
Tried to call patient back to get more information as to what type of paper work he dropped off but number has been disconnected.

## 2015-09-06 ENCOUNTER — Encounter (HOSPITAL_COMMUNITY): Payer: Self-pay | Admitting: Nurse Practitioner

## 2015-09-06 ENCOUNTER — Emergency Department (HOSPITAL_COMMUNITY)
Admission: EM | Admit: 2015-09-06 | Discharge: 2015-09-06 | Disposition: A | Discharge status: Home / Self Care | Payer: Worker's Compensation | Attending: Emergency Medicine | Admitting: Emergency Medicine

## 2015-09-06 ENCOUNTER — Emergency Department (HOSPITAL_COMMUNITY): Payer: Worker's Compensation

## 2015-09-06 DIAGNOSIS — Y9289 Other specified places as the place of occurrence of the external cause: Secondary | ICD-10-CM | POA: Diagnosis not present

## 2015-09-06 DIAGNOSIS — S0990XA Unspecified injury of head, initial encounter: Secondary | ICD-10-CM | POA: Diagnosis not present

## 2015-09-06 DIAGNOSIS — Y990 Civilian activity done for income or pay: Secondary | ICD-10-CM | POA: Diagnosis not present

## 2015-09-06 DIAGNOSIS — Z79899 Other long term (current) drug therapy: Secondary | ICD-10-CM | POA: Diagnosis not present

## 2015-09-06 DIAGNOSIS — S199XXA Unspecified injury of neck, initial encounter: Secondary | ICD-10-CM | POA: Diagnosis not present

## 2015-09-06 DIAGNOSIS — G40909 Epilepsy, unspecified, not intractable, without status epilepticus: Secondary | ICD-10-CM | POA: Diagnosis not present

## 2015-09-06 DIAGNOSIS — Z791 Long term (current) use of non-steroidal anti-inflammatories (NSAID): Secondary | ICD-10-CM | POA: Diagnosis not present

## 2015-09-06 DIAGNOSIS — Z8679 Personal history of other diseases of the circulatory system: Secondary | ICD-10-CM | POA: Diagnosis not present

## 2015-09-06 DIAGNOSIS — Y9389 Activity, other specified: Secondary | ICD-10-CM | POA: Insufficient documentation

## 2015-09-06 DIAGNOSIS — W2209XA Striking against other stationary object, initial encounter: Secondary | ICD-10-CM | POA: Insufficient documentation

## 2015-09-06 MED ORDER — ACETAMINOPHEN 325 MG PO TABS
325.0000 mg | ORAL_TABLET | Freq: Once | ORAL | Status: AC
  Administered 2015-09-06: 650 mg via ORAL
  Filled 2015-09-06: qty 1

## 2015-09-06 MED ORDER — IBUPROFEN 200 MG PO TABS
600.0000 mg | ORAL_TABLET | Freq: Once | ORAL | Status: DC
  Filled 2015-09-06: qty 1

## 2015-09-06 NOTE — Discharge Instructions (Signed)
Concussion, Adult  A concussion, or closed-head injury, is a brain injury caused by a direct blow to the head or by a quick and sudden movement (jolt) of the head or neck. Concussions are usually not life-threatening. Even so, the effects of a concussion can be serious. If you have had a concussion before, you are more likely to experience concussion-like symptoms after a direct blow to the head.   CAUSES  · Direct blow to the head, such as from running into another player during a soccer game, being hit in a fight, or hitting your head on a hard surface.  · A jolt of the head or neck that causes the brain to move back and forth inside the skull, such as in a car crash.  SIGNS AND SYMPTOMS  The signs of a concussion can be hard to notice. Early on, they may be missed by you, family members, and health care providers. You may look fine but act or feel differently.  Symptoms are usually temporary, but they may last for days, weeks, or even longer. Some symptoms may appear right away while others may not show up for hours or days. Every head injury is different. Symptoms include:  · Mild to moderate headaches that will not go away.  · A feeling of pressure inside your head.  · Having more trouble than usual:    Learning or remembering things you have heard.    Answering questions.    Paying attention or concentrating.    Organizing daily tasks.    Making decisions and solving problems.  · Slowness in thinking, acting or reacting, speaking, or reading.  · Getting lost or being easily confused.  · Feeling tired all the time or lacking energy (fatigued).  · Feeling drowsy.  · Sleep disturbances.    Sleeping more than usual.    Sleeping less than usual.    Trouble falling asleep.    Trouble sleeping (insomnia).  · Loss of balance or feeling lightheaded or dizzy.  · Nausea or vomiting.  · Numbness or tingling.  · Increased sensitivity to:    Sounds.    Lights.    Distractions.  · Vision problems or eyes that tire  easily.  · Diminished sense of taste or smell.  · Ringing in the ears.  · Mood changes such as feeling sad or anxious.  · Becoming easily irritated or angry for little or no reason.  · Lack of motivation.  · Seeing or hearing things other people do not see or hear (hallucinations).  DIAGNOSIS  Your health care provider can usually diagnose a concussion based on a description of your injury and symptoms. He or she will ask whether you passed out (lost consciousness) and whether you are having trouble remembering events that happened right before and during your injury.  Your evaluation might include:  · A brain scan to look for signs of injury to the brain. Even if the test shows no injury, you may still have a concussion.  · Blood tests to be sure other problems are not present.  TREATMENT  · Concussions are usually treated in an emergency department, in urgent care, or at a clinic. You may need to stay in the hospital overnight for further treatment.  · Tell your health care provider if you are taking any medicines, including prescription medicines, over-the-counter medicines, and natural remedies. Some medicines, such as blood thinners (anticoagulants) and aspirin, may increase the chance of complications. Also tell your health care   provider whether you have had alcohol or are taking illegal drugs. This information may affect treatment.  · Your health care provider will send you home with important instructions to follow.  · How fast you will recover from a concussion depends on many factors. These factors include how severe your concussion is, what part of your brain was injured, your age, and how healthy you were before the concussion.  · Most people with mild injuries recover fully. Recovery can take time. In general, recovery is slower in older persons. Also, persons who have had a concussion in the past or have other medical problems may find that it takes longer to recover from their current injury.  HOME  CARE INSTRUCTIONS  General Instructions  · Carefully follow the directions your health care provider gave you.  · Only take over-the-counter or prescription medicines for pain, discomfort, or fever as directed by your health care provider.  · Take only those medicines that your health care provider has approved.  · Do not drink alcohol until your health care provider says you are well enough to do so. Alcohol and certain other drugs may slow your recovery and can put you at risk of further injury.  · If it is harder than usual to remember things, write them down.  · If you are easily distracted, try to do one thing at a time. For example, do not try to watch TV while fixing dinner.  · Talk with family members or close friends when making important decisions.  · Keep all follow-up appointments. Repeated evaluation of your symptoms is recommended for your recovery.  · Watch your symptoms and tell others to do the same. Complications sometimes occur after a concussion. Older adults with a brain injury may have a higher risk of serious complications, such as a blood clot on the brain.  · Tell your teachers, school nurse, school counselor, coach, athletic trainer, or work manager about your injury, symptoms, and restrictions. Tell them about what you can or cannot do. They should watch for:    Increased problems with attention or concentration.    Increased difficulty remembering or learning new information.    Increased time needed to complete tasks or assignments.    Increased irritability or decreased ability to cope with stress.    Increased symptoms.  · Rest. Rest helps the brain to heal. Make sure you:    Get plenty of sleep at night. Avoid staying up late at night.    Keep the same bedtime hours on weekends and weekdays.    Rest during the day. Take daytime naps or rest breaks when you feel tired.  · Limit activities that require a lot of thought or concentration. These include:    Doing homework or job-related  work.    Watching TV.    Working on the computer.  · Avoid any situation where there is potential for another head injury (football, hockey, soccer, basketball, martial arts, downhill snow sports and horseback riding). Your condition will get worse every time you experience a concussion. You should avoid these activities until you are evaluated by the appropriate follow-up health care providers.  Returning To Your Regular Activities  You will need to return to your normal activities slowly, not all at once. You must give your body and brain enough time for recovery.  · Do not return to sports or other athletic activities until your health care provider tells you it is safe to do so.  · Ask   your health care provider when you can drive, ride a bicycle, or operate heavy machinery. Your ability to react may be slower after a brain injury. Never do these activities if you are dizzy.  · Ask your health care provider about when you can return to work or school.  Preventing Another Concussion  It is very important to avoid another brain injury, especially before you have recovered. In rare cases, another injury can lead to permanent brain damage, brain swelling, or death. The risk of this is greatest during the first 7-10 days after a head injury. Avoid injuries by:  · Wearing a seat belt when riding in a car.  · Drinking alcohol only in moderation.  · Wearing a helmet when biking, skiing, skateboarding, skating, or doing similar activities.  · Avoiding activities that could lead to a second concussion, such as contact or recreational sports, until your health care provider says it is okay.  · Taking safety measures in your home.    Remove clutter and tripping hazards from floors and stairways.    Use grab bars in bathrooms and handrails by stairs.    Place non-slip mats on floors and in bathtubs.    Improve lighting in dim areas.  SEEK MEDICAL CARE IF:  · You have increased problems paying attention or  concentrating.  · You have increased difficulty remembering or learning new information.  · You need more time to complete tasks or assignments than before.  · You have increased irritability or decreased ability to cope with stress.  · You have more symptoms than before.  Seek medical care if you have any of the following symptoms for more than 2 weeks after your injury:  · Lasting (chronic) headaches.  · Dizziness or balance problems.  · Nausea.  · Vision problems.  · Increased sensitivity to noise or light.  · Depression or mood swings.  · Anxiety or irritability.  · Memory problems.  · Difficulty concentrating or paying attention.  · Sleep problems.  · Feeling tired all the time.  SEEK IMMEDIATE MEDICAL CARE IF:  · You have severe or worsening headaches. These may be a sign of a blood clot in the brain.  · You have weakness (even if only in one hand, leg, or part of the face).  · You have numbness.  · You have decreased coordination.  · You vomit repeatedly.  · You have increased sleepiness.  · One pupil is larger than the other.  · You have convulsions.  · You have slurred speech.  · You have increased confusion. This may be a sign of a blood clot in the brain.  · You have increased restlessness, agitation, or irritability.  · You are unable to recognize people or places.  · You have neck pain.  · It is difficult to wake you up.  · You have unusual behavior changes.  · You lose consciousness.  MAKE SURE YOU:  · Understand these instructions.  · Will watch your condition.  · Will get help right away if you are not doing well or get worse.     This information is not intended to replace advice given to you by your health care provider. Make sure you discuss any questions you have with your health care provider.     Document Released: 11/01/2003 Document Revised: 09/01/2014 Document Reviewed: 03/03/2013  Elsevier Interactive Patient Education ©2016 [employer]

## 2015-09-06 NOTE — ED Notes (Signed)
He was loading equipment at work and hit his head on a piece of steel. He denies LOC but c/o pain to forehead and top of head since. He reports nausea, intermittent blurred vision since. He is A&Ox4, breathing easily

## 2015-09-06 NOTE — ED Provider Notes (Signed)
CSN: [id number]     Arrival date & time 09/06/15  1820 History  By signing my name below, I, Tanda RockersMargaux Venter, attest that this documentation has been prepared under the direction and in the presence of Mohawk IndustriesJeff Tarryn Bogdan, PA-C. Electronically Signed: Tanda RockersMargaux Venter, ED Scribe. 09/06/2015. 8:10 PM   Chief Complaint  Patient presents with  . Head Injury   The history is provided by the patient. No language interpreter was used.     HPI Comments: Ricky Humphrey is a 25 y.o. male who presents to the Emergency Department complaining of sudden onset, constant, left sided occiput head pain s/p head injury that occurred earlier today. Pt was at work when he bent down to pick something up and when he came back up he hit the back of his head on a steel conveyer belt. No LOC. Pt also complains of headache, mild neck pain, nausea, and intermittent blurred vision. He has not taken anything for his pain. Pt is alert and oriented to person, place, and time. Denies memory loss, vomiting, loss of vision, or any other associated symptoms.   Past Medical History  Diagnosis Date  . Seizures (HCC)   . Cerebral palsy (HCC)     mild  . Paroxysmal atrial flutter (HCC)     brief, post sz on 09/22/11  . 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 Epilepsy [facility](HCC)    Past Surgical History  Procedure Laterality Date  . Acrnoid cyst     Family History  Problem Relation Age of Onset  . Migraines Mother   . Diabetes Mother    Social History  Substance Use Topics  . Smoking status: Never Smoker   . Smokeless tobacco: Never Used  . Alcohol Use: 0.0 oz/week    0 Standard drinks or equivalent per week     Comment: occasional    Review of Systems  All other systems reviewed and are negative.  Allergies  Review of patient's allergies indicates no known allergies.  Home Medications   Prior to Admission medications   Medication Sig Start Date End Date Taking? Authorizing Provider  carbamazepine  (TEGRETOL XR) 400 MG 12 hr tablet Take 1 tablet (400 mg total) by mouth 2 (two) times daily. 04/23/15   Suanne MarkerVikram R Penumalli, MD  cyclobenzaprine (FLEXERIL) 10 MG tablet Take 1 tablet (10 mg total) by mouth 3 (three) times daily as needed for muscle spasms. 08/02/15   Carmelina DaneJeffery S Anderson, MD  levETIRAcetam (KEPPRA) 750 MG tablet Take 2 tablets (1,500 mg total) by mouth 2 (two) times daily. 04/23/15   Suanne MarkerVikram R Penumalli, MD  naproxen sodium (ANAPROX DS) 550 MG tablet Take 1 tablet (550 mg total) by mouth 2 (two) times daily with a meal. 08/02/15 08/01/16  Carmelina DaneJeffery S Anderson, MD   Triage Vitals:  BP 140/79 mmHg  Pulse 72  Resp 20  SpO2 100%   Physical Exam  Constitutional: He is oriented to person, place, and time. He appears well-developed and well-nourished. No distress.  HENT:  Head: Normocephalic and atraumatic.  No significant tenderness to palpation to head  Eyes: Conjunctivae and EOM are normal.  Neck: Normal range of motion. Neck supple. No tracheal deviation present.  Full active ROM No C spine tenderness Mild tenderness to lateral posterior soft tissue of neck   Cardiovascular: Normal rate.   Pulmonary/Chest: Effort normal. No respiratory distress.  Musculoskeletal: Normal range of motion.  Neurological: He is alert and oriented to person, place, and time.  He has normal reflexes. He displays normal reflexes. No cranial nerve deficit. He exhibits normal muscle tone. Coordination normal.  Skin: Skin is warm and dry.  Psychiatric: He has a normal mood and affect. His behavior is normal.  Nursing note and vitals reviewed.   ED Course  Procedures (including critical care time)  DIAGNOSTIC STUDIES: Oxygen Saturation is 100% on RA, normal by my interpretation.    COORDINATION OF CARE: 8:06 PM-Discussed treatment plan which includes Tylenol with pt at bedside and pt agreed to plan.   Labs Review Labs Reviewed - No data to display  Imaging Review Ct Head Wo Contrast  09/06/2015   CLINICAL DATA:  25 year old male with trauma to the head. History of arachnoid cyst EXAM: CT HEAD WITHOUT CONTRAST TECHNIQUE: Contiguous axial images were obtained from the base of the skull through the vertex without intravenous contrast. COMPARISON:  CT dated 07/08/2015 FINDINGS: No acute intracranial hemorrhage. There is a 3.6 x 7.6 cm arachnoid cyst in the left parietal region which is grossly stable or minimally increased in size. There is mild associated mass effect on the adjacent brain parenchyma. No midline shift. The ventricles and the sulci are appropriate in size for the patient's age. The visualized paranasal sinuses and mastoid air cells are well aerated. Left parietal calvarial craniotomy changes again noted. No acute fracture. IMPRESSION: No acute intracranial hemorrhage. Left parietal arachnoid cyst grossly stable or minimally increased compared to the prior study. Electronically Signed   By: Elgie Collard M.D.   On: 09/06/2015 22:04     EKG Interpretation None      MDM   Final diagnoses:  Head injury, initial encounter   Labs: None  Imaging: CT Head w/o contrast  Consults: None  Therapeutics: Tylenol  Discharge Meds:   Assessment/Plan: 25 year old male presents today with head injury. Patient has no significant mechanism of injury that would necessitate imaging studies here today. He meets no criteria based on the Median head CT criteria. No signs of trauma to the head or neck. I informed patient that CT scan was not necessary, patient's mother called requesting CT scan, patient agreed that this was necessary. Patient with a normal head CT with the exception of incidental finding of a arachnoid cyst grossly stable or minimally increased compared to prior study. Patient was made aware of this information and instructed to lay information onto his neurologist. Patient was given strict return precautions, and he verbalizes understanding and agreement for today's plan.   I  personally performed the services described in this documentation, which was scribed in my presence. The recorded information has been reviewed and is accurate.      Eyvonne Mechanic, PA-C 09/06/15 2227  Mancel Bale, MD 09/07/15 [phone]

## 2015-10-16 ENCOUNTER — Ambulatory Visit (INDEPENDENT_AMBULATORY_CARE_PROVIDER_SITE_OTHER): Payer: 59 | Admitting: Diagnostic Neuroimaging

## 2015-10-16 ENCOUNTER — Encounter: Payer: Self-pay | Admitting: Diagnostic Neuroimaging

## 2015-10-16 VITALS — BP 119/59 | HR 69 | Ht 75.0 in | Wt 204.8 lb

## 2015-10-16 DIAGNOSIS — G44309 Post-traumatic headache, unspecified, not intractable: Secondary | ICD-10-CM | POA: Diagnosis not present

## 2015-10-16 DIAGNOSIS — G40109 Localization-related (focal) (partial) symptomatic epilepsy and epileptic syndromes with simple partial seizures, not intractable, without status epilepticus: Secondary | ICD-10-CM

## 2015-10-16 MED ORDER — LEVETIRACETAM 750 MG PO TABS: 1500.0000 mg | ORAL_TABLET | Freq: Two times a day (BID) | ORAL | Status: DC

## 2015-10-16 MED ORDER — CARBAMAZEPINE ER 400 MG PO TB12: 400.0000 mg | ORAL_TABLET | Freq: Two times a day (BID) | ORAL | Status: DC

## 2015-10-16 NOTE — Progress Notes (Signed)
[facility]  PATIENT: Ricky Humphrey DOB: Mar 09, 1991  REFERRING CLINICIAN:  HISTORY FROM: patient REASON FOR VISIT: follow up   HISTORICAL  CHIEF COMPLAINT:  Chief Complaint  Patient presents with  . Epilepsy    rm 6, no seizures in past 6 mos, head injury at work md Jan, no LOC, fell to floor; HA ever since  . Follow-up    6 month    HISTORY OF PRESENT ILLNESS:   UPDATE 10/16/15: Since last visit, no seizures. Tolerating meds. Good compliance. Unfortunately, he was at work, bent down to reach for a package, and then struck his head on extender platform from the truck. He had severe pain, but no LOC. This happened 09/06/15. Went to ER, CT head showed no acute findings. Arachnoid cyst slightly larger than 2013, but no major findings.  UPDATE 04/23/15: Since last visit no sz. Last sz Oct 2014. Tolerating meds without issues. Very good compliance with medications.  UPDATE 07/17/14: Since last visit, doing well and no seizures. Asking about possibility of applying for driver's license. Tolerating medications. No new events of issues.   UPDATE 06/20/13: Since last visit, patient was doing well until breakthrough seizure on 06/15/13. Apparently his pharmacy changed supplier of Carbatrol to a different manufacturer and did not give patient full refill of medication. Patient ended up missing 3 successive doses of Carbatrol and then had multiple back-to-back seizures without return of consciousness. Patient was admitted to the hospital, monitored in the intensive care unit, discharged one day later. Since that time patient has returned to baseline. He still feels a little but sore, especially his tongue. Patient acknowledges missing intermittent doses of medication over the past one year, but usually without any consequence. Patient continues to have problems with insomnia and shifted sleep cycle. He typically goes to sleep at 3 AM and wakes up at noon or 2 PM. He  drinks alcohol on social basis.  UPDATE 03/02/12: Doing about the same. No new seizures. Tolerating meds. Asking about driving.   UPDATE 11/19/11: On 09/23/11, pt admitted for breakthrough sz. He had run out of carbatrol, and level was undetectable when checked. Also dx'd with aflutter and has followed up with Dr. Jacinto Halim. No further events.  PRIOR HPI: 25 year old left-handed male with history of mild cerebral palsy, here for evaluation of seizure disorder. Patient is product of full-term gestation, 18 hour labor, attempted vaginal delivery with foreceps, with emergent c-section.  Patient developed normally from cognitive standpoint. He had mild right-sided incoordination.  Neuroimaging study demonstrated left brain cyst, and patient was diagnosed with cerebral palsy. Patient had no further problems until age 55 years old, when he was freshman in high school, when he had a new onset generalized convulsive seizure with tongue biting and incontinence at school. He went to the emergency room and a second seizure in the CT scan. He started on Keppra.  He continued to have seizures once per week.  Due to the presence of left brain cyst, he underwent left brain craniotomy, with unsuccessful cyst resection.  Then Carbatrol was added to his regimen. His seizure frequency declined to once every 6-9 months. He had breakthrough seizure in April 2012 with concomitant strep infection. Most recent breakthrough seizure was 2 days ago on 07/27/11. Nowadays typical seizures consist of right arm posturing and convulsions, followed by loss of consciousness.   REVIEW OF SYSTEMS: Full 14 system review of systems performed and notable only for memory loss headache back pain neck pain  restless legs eye pain.    ALLERGIES: No Known Allergies  HOME MEDICATIONS: Outpatient Prescriptions Prior to Visit  Medication Sig Dispense Refill  . carbamazepine (TEGRETOL XR) 400 MG 12 hr tablet Take 1 tablet (400 mg total) by mouth 2 (two)  times daily. 180 tablet 4  . cyclobenzaprine (FLEXERIL) 10 MG tablet Take 1 tablet (10 mg total) by mouth 3 (three) times daily as needed for muscle spasms. 30 tablet 0  . levETIRAcetam (KEPPRA) 750 MG tablet Take 2 tablets (1,500 mg total) by mouth 2 (two) times daily. 360 tablet 4  . naproxen sodium (ANAPROX DS) 550 MG tablet Take 1 tablet (550 mg total) by mouth 2 (two) times daily with a meal. 40 tablet 0   No facility-administered medications prior to visit.    PAST MEDICAL HISTORY: Past Medical History  Diagnosis Date  . Seizures (HCC)   . Cerebral palsy (HCC)     mild  . Paroxysmal atrial flutter (HCC)     brief, post sz on 09/22/11  . 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 Epilepsy (HCC)     PAST SURGICAL HISTORY: Past Surgical History  Procedure Laterality Date  . Acrnoid cyst      FAMILY HISTORY: Family History  Problem Relation Age of Onset  . Migraines Mother   . Diabetes Mother     SOCIAL HISTORY:  Social History   Social History  . Marital Status: Single    Spouse Name: N/A  . Number of Children: 0  . Years of Education: College   Occupational History  . Student    Social History Main Topics  . Smoking status: Never Smoker   . Smokeless tobacco: Never Used  . Alcohol Use: 0.0 oz/week    0 Standard drinks or equivalent per week     Comment: occasional  . Drug Use: No  . Sexual Activity: Yes   Other Topics Concern  . Not on file   Social History Narrative   Patient lives with his girlfriend.   Archivist at [employer]    Caffeine Use: 2-3 cups daily     PHYSICAL EXAM  Filed Vitals:   10/16/15 1137  BP: 119/59  Pulse: 69  Height:  (1.905 m)  Weight: 204 lb 12.8 oz (92.897 kg)    Not recorded      Body mass index is 25.6 kg/(m^2).  GENERAL EXAM: Patient is in no distress  CARDIOVASCULAR: Regular rate and rhythm, no murmurs, no carotid bruits  NEUROLOGIC: MENTAL STATUS: awake, alert, language fluent,  comprehension intact, naming intact CRANIAL NERVE: pupils equal and reactive to light, visual fields full to confrontation, extraocular muscles intact, no nystagmus, facial sensation and strength symmetric, uvula midline, shoulder shrug symmetric, tongue midline. MOTOR: normal bulk and tone, full strength in the BUE, BLE SENSORY: normal and symmetric to light touch, pinprick, temperature, vibration COORDINATION: finger-nose-finger, fine finger movements normal; SLOW TAPPING IN RUE AND RLE.  REFLEXES: deep tendon reflexes present and symmetric GAIT/STATION: narrow based gait; romberg is negative    DIAGNOSTIC DATA (LABS, IMAGING, TESTING) - I reviewed patient records, labs, notes, testing and imaging myself where available.  Lab Results  Component Value Date   WBC 19.5* 07/08/2015   HGB 14.6 07/08/2015   HCT 42.2 07/08/2015   MCV 90.8 07/08/2015   PLT 269 07/08/2015      Component Value Date/Time   NA 138 07/09/2015 0006   NA 141 04/23/2015 1255   K  4.0 07/09/2015 0006   CL 109 07/09/2015 0006   CO2 26 07/09/2015 0006   GLUCOSE 103* 07/09/2015 0006   GLUCOSE 65 04/23/2015 1255   BUN 10 07/09/2015 0006   BUN 14 04/23/2015 1255   CREATININE 1.30* 07/09/2015 0006   CALCIUM 8.2* 07/09/2015 0006   PROT 6.7 04/23/2015 1255   PROT 6.6 09/23/2011 0525   ALBUMIN 3.9 04/23/2015 1255   ALBUMIN 3.2* 09/23/2011 0525   AST 24 04/23/2015 1255   ALT 20 04/23/2015 1255   ALKPHOS 71 04/23/2015 1255   BILITOT 0.3 04/23/2015 1255   BILITOT 0.2 07/17/2014 1234   GFRNONAA >60 07/09/2015 0006   GFRAA >60 07/09/2015 0006   No results found for: CHOL No results found for: HGBA1C No results found for: VITAMINB12 Lab Results  Component Value Date   TSH 1.167 09/22/2011    09/22/11 CT head  - Prior left temporal craniotomy. Large CSF attenuation collection extra-axial in left parietal region, consistent with arachnoid cyst. Few linear areas of slightly higher attenuation are seen within the  anterior aspect of this collection, question septations, uncertain etiology; consider either comparison to prior outside exams to assess stability or further evaluation by MR imaging with and without contrast to assess.  09/06/15 CT head  - No acute intracranial hemorrhage. Left parietal arachnoid cyst grossly stable or minimally increased compared to the prior study.    ASSESSMENT AND PLAN  25 y.o.  left-handed male with mild cerebral palsy and complex partial seizures with secondary generalization.  Patient was having breakthrough seizures every 6-9 months. Now has been sz free since Oct 2014 with better dosing of anti-seizure medications.  Also with post-traumatic headaches since Sep 06, 2015; CT head and neuro exam unremarkable. Advised conservative mgmt for now.  Dx:  Localization-related epilepsy (HCC)  Post-traumatic headache, not intractable, unspecified chronicity pattern     PLAN: - continue LEV  BID - continue CBZ XR  BID - try ibuprofen or acetaminophen as needed for headaches - try melatonin for sleep aid  Meds ordered this encounter  Medications  . levETIRAcetam (KEPPRA) 750 MG tablet    Sig: Take 2 tablets (1,500 mg total) by mouth 2 (two) times daily.    Dispense:  360 tablet    Refill:  4  . carbamazepine (TEGRETOL XR) 400 MG 12 hr tablet    Sig: Take 1 tablet (400 mg total) by mouth 2 (two) times daily.    Dispense:  180 tablet    Refill:  4    Return in about 6 months (around 04/14/2016).    Suanne Marker, MD 10/16/2015, 12:04 PM Certified in Neurology, Neurophysiology and Neuroimaging  [facility] [address], [address] [place], [place] [address] [phone]

## 2015-10-16 NOTE — Patient Instructions (Signed)
Thank you for coming to see [place] at [facility]. I hope we have been able to provide you high quality care today.  You may receive a patient satisfaction survey over the next few weeks. We would appreciate your feedback and comments so that we may continue to improve ourselves and the health of our patients.  - continue carbamazepine and levetiracetam - try ibuprofen or acetaminophen as needed for headaches - try melatonin for sleep aid   ~~~~~~~~~~~~~~~~~~~~~~~~~~~~~~~~~~~~~~~~~~~~~~~~~~~~~~~~~~~~~~~~~  DR. PENUMALLI'S GUIDE TO HAPPY AND HEALTHY LIVING These are some of my general health and wellness recommendations. Some of them may apply to you better than others. Please use common sense as you try these suggestions and feel free to ask me any questions.   ACTIVITY/FITNESS Mental, social, emotional and physical stimulation are very important for brain and body health. Try learning a new activity (arts, music, language, sports, games).  Keep moving your body to the best of your abilities. You can do this at home, inside or outside, the park, community center, gym or anywhere you like. Consider a physical therapist or personal trainer to get started. Consider the app Sworkit. Fitness trackers such as smart-watches, smart-phones or Fitbits can help as well.   NUTRITION Eat more plants: colorful vegetables, nuts, seeds and berries.  Eat less sugar, salt, preservatives and processed foods.  Avoid toxins such as cigarettes and alcohol.  Drink water when you are thirsty. Warm water with a slice of lemon is an excellent morning drink to start the day.  Consider these websites for more information The Nutrition Source (https://www.henry-hernandez.biz/) Precision Nutrition (WindowBlog.ch)   RELAXATION Consider practicing mindfulness meditation or other relaxation techniques such as deep breathing, prayer, yoga, tai chi, massage.  See website mindful.org or the apps Headspace or Calm to help get started.   SLEEP Try to get at least 7-8+ hours sleep per day. Regular exercise and reduced caffeine will help you sleep better. Practice good sleep hygeine techniques. See website sleep.org for more information.   PLANNING Prepare estate planning, living will, healthcare POA documents. Sometimes this is best planned with the help of an attorney. Theconversationproject.org and agingwithdignity.org are excellent resources.

## 2015-10-23 ENCOUNTER — Ambulatory Visit: Payer: 59 | Admitting: Diagnostic Neuroimaging

## 2015-11-15 MED FILL — levETIRAcetam 750 MG TABS: 750 | 90 days supply | Qty: 360 | Fill #1

## 2015-11-15 MED FILL — CARBAMAZEPINE ER 400 MG TAB: 400 | 90 days supply | Qty: 180 | Fill #1

## 2016-02-11 MED FILL — levETIRAcetam 750 MG TABS: 750 | 90 days supply | Qty: 360 | Fill #2

## 2016-04-10 ENCOUNTER — Ambulatory Visit: Payer: 59 | Admitting: Diagnostic Neuroimaging

## 2016-04-11 ENCOUNTER — Encounter: Payer: Self-pay | Admitting: Diagnostic Neuroimaging

## 2016-04-24 ENCOUNTER — Emergency Department (HOSPITAL_BASED_OUTPATIENT_CLINIC_OR_DEPARTMENT_OTHER)
Admission: EM | Admit: 2016-04-24 | Discharge: 2016-04-25 | Disposition: A | Discharge status: Home / Self Care | Payer: 59 | Attending: Emergency Medicine | Admitting: Emergency Medicine

## 2016-04-24 ENCOUNTER — Encounter (HOSPITAL_BASED_OUTPATIENT_CLINIC_OR_DEPARTMENT_OTHER): Payer: Self-pay

## 2016-04-24 ENCOUNTER — Emergency Department (HOSPITAL_BASED_OUTPATIENT_CLINIC_OR_DEPARTMENT_OTHER): Payer: 59

## 2016-04-24 DIAGNOSIS — S63501A Unspecified sprain of right wrist, initial encounter: Secondary | ICD-10-CM

## 2016-04-24 DIAGNOSIS — Y93F2 Activity, caregiving, lifting: Secondary | ICD-10-CM | POA: Diagnosis not present

## 2016-04-24 DIAGNOSIS — Y929 Unspecified place or not applicable: Secondary | ICD-10-CM | POA: Diagnosis not present

## 2016-04-24 DIAGNOSIS — X500XXA Overexertion from strenuous movement or load, initial encounter: Secondary | ICD-10-CM | POA: Diagnosis not present

## 2016-04-24 DIAGNOSIS — S6991XA Unspecified injury of right wrist, hand and finger(s), initial encounter: Secondary | ICD-10-CM | POA: Diagnosis present

## 2016-04-24 DIAGNOSIS — Y990 Civilian activity done for income or pay: Secondary | ICD-10-CM | POA: Diagnosis not present

## 2016-04-24 DIAGNOSIS — M25531 Pain in right wrist: Secondary | ICD-10-CM | POA: Diagnosis not present

## 2016-04-24 NOTE — ED Provider Notes (Signed)
By signing my name below, I, Avnee Patel, attest that this documentation has been prepared under the direction and in the presence of Layla MawKristen N Jaishaun Mcnab, DO  Electronically Signed: Clovis PuAvnee Patel, ED Scribe. 04/24/16. 12:06 AM.   TIME SEEN: 11:47 PM   CHIEF COMPLAINT:  Chief Complaint  Patient presents with  . Wrist Pain    HPI:  HPI Comments:  Ricky Humphrey is a 25 y.o. male who presents to the Emergency Department complaining of moderate, unchanged right wrist pain s/p heavy lifting onset today while at work. Pt states the pain is made worse with certain movements. No alleviating factors reported. No over the counter medications or home remedies attempted prior to arrival. Pt denies Numbness or focal weakness. Family reports he has a history of cerebral palsy and does have some decreased sensation and weakness in his baseline but nothing that is new. He is right-hand dominant.   ROS: See HPI Constitutional: no fever  Eyes: no drainage  ENT: no runny nose   Cardiovascular:  no chest pain  Resp: no SOB  GI: no vomiting GU: no dysuria Integumentary: no rash  Allergy: no hives  Musculoskeletal: no leg swelling. Positive arthralgias.  Neurological: no slurred speech ROS otherwise negative  PAST MEDICAL HISTORY/PAST SURGICAL HISTORY:  Past Medical History:  Diagnosis Date  . Arachnoid cyst    s/p drainage by craniotomy  . Cerebral palsy (HCC)    mild  . Epilepsy (HCC)   . Paroxysmal atrial flutter (HCC)    brief, post sz on 09/22/11  . Seizures (HCC)     MEDICATIONS:  Prior to Admission medications   Medication Sig Start Date End Date Taking? Authorizing Provider  carbamazepine (TEGRETOL XR) 400 MG 12 hr tablet Take 1 tablet (400 mg total) by mouth 2 (two) times daily. 10/16/15   Suanne MarkerVikram R Penumalli, MD  levETIRAcetam (KEPPRA) 750 MG tablet Take 2 tablets (1,500 mg total) by mouth 2 (two) times daily. 10/16/15   Suanne MarkerVikram R Penumalli, MD    ALLERGIES:  No Known  Allergies  SOCIAL HISTORY:  Social History  Substance Use Topics  . Smoking status: Never Smoker  . Smokeless tobacco: Never Used  . Alcohol use 0.0 oz/week     Comment: occasional    FAMILY HISTORY: Family History  Problem Relation Age of Onset  . Migraines Mother   . Diabetes Mother     EXAM: BP 143/94 (BP Location: Left Arm)   Pulse 63   Temp 98.2 F (36.8 C) (Oral)   Resp 18   Ht 6\' 3"  (1.905 m)   Wt 188 lb (85.3 kg)   SpO2 100%   BMI 23.50 kg/m  CONSTITUTIONAL: Alert and oriented and responds appropriately to questions. Well-appearing; well-nourished HEAD: Normocephalic EYES: Conjunctivae clear, PERRL ENT: normal nose; no rhinorrhea; moist mucous membranes NECK: Supple, no meningismus, no LAD  CARD: RRR; S1 and S2 appreciated; no murmurs, no clicks, no rubs, no gallops RESP: Normal chest excursion without splinting or tachypnea; breath sounds clear and equal bilaterally; no wheezes, no rhonchi, no rales, no hypoxia or respiratory distress, speaking full sentences ABD/GI: Normal bowel sounds; non-distended; soft, non-tender, no rebound, no guarding, no peritoneal signs BACK:  The back appears normal and is non-tender to palpation, there is no CVA tenderness EXT: Tender to palpation over the last of the right wrist and over the ulnar styloid without bony deformity. There is no erythema, warmth or joint swelling. Compartments are soft. No tenderness over the scaphoid or pain  with axial loading of the thumb. Reports normal baseline sensation throughout the right arm. Normal grip strength bilaterally. Slightly decreased range of motion in the wrist because of pain especially with pronation and supination. Otherwise Normal ROM in all joints; otherwise externally is are non-tender to palpation; no edema; normal capillary refill; no cyanosis, no calf tenderness or swelling    SKIN: Normal color for age and race; warm; no rash NEURO: Moves all extremities equally, sensation to  light touch intact diffusely, cranial nerves II through XII intact PSYCH: The patient's mood and manner are appropriate. Grooming and personal hygiene are appropriate.  MEDICAL DECISION MAKING: Patient here with what seems to be right wrist sprain, possible tendinitis. No sign of septic arthritis or gout on exam. X-ray shows no fracture or dislocation. He is are you in a splint that he bought over-the-counter. Have advised him to continue the splint and use ibuprofen for pain to help with inflammation. Recommended rest, elevation and ice. Have discussed if symptoms are not improving with medical management that he should follow-up with a sports medicine physician. We'll give him this referral. No other sign of injury on exam. Have provided him a work note for light duty for the next week.  At this time, I do not feel there is any life-threatening condition present. I have reviewed and discussed all results (EKG, imaging, lab, urine as appropriate), exam findings with patient/family. I have reviewed nursing notes and appropriate previous records.  I feel the patient is safe to be discharged home without further emergent workup and can continue workup as an outpatient as needed. Discussed usual and customary return precautions. Patient/family verbalize understanding and are comfortable with this plan.  Outpatient follow-up has been provided. All questions have been answered.   I personally performed the services described in this documentation, which was scribed in my presence. The recorded information has been reviewed and is accurate.     Layla Maw Corretta Munce, DO 04/25/16 [phone]

## 2016-04-24 NOTE — ED Triage Notes (Signed)
Pt c/o right wrist pain after work today-no known injury-wearing velcro type splint-NAD-steady gait

## 2016-04-25 MED ORDER — IBUPROFEN 800 MG PO TABS: 800.0000 mg | ORAL_TABLET | Freq: Three times a day (TID) | ORAL | 0 refills | Status: DC | PRN

## 2016-05-01 MED FILL — IBUPROFEN 800 MG TABLET: 800 | 10 days supply | Qty: 30 | Fill #0

## 2016-06-13 ENCOUNTER — Telehealth: Payer: Self-pay | Admitting: Diagnostic Neuroimaging

## 2016-06-13 ENCOUNTER — Ambulatory Visit (INDEPENDENT_AMBULATORY_CARE_PROVIDER_SITE_OTHER): Payer: 59 | Admitting: Diagnostic Neuroimaging

## 2016-06-13 ENCOUNTER — Encounter: Payer: Self-pay | Admitting: Diagnostic Neuroimaging

## 2016-06-13 VITALS — BP 123/71 | HR 59 | Wt 195.6 lb

## 2016-06-13 DIAGNOSIS — G4700 Insomnia, unspecified: Secondary | ICD-10-CM | POA: Diagnosis not present

## 2016-06-13 DIAGNOSIS — G44309 Post-traumatic headache, unspecified, not intractable: Secondary | ICD-10-CM

## 2016-06-13 DIAGNOSIS — G4719 Other hypersomnia: Secondary | ICD-10-CM | POA: Diagnosis not present

## 2016-06-13 DIAGNOSIS — G40109 Localization-related (focal) (partial) symptomatic epilepsy and epileptic syndromes with simple partial seizures, not intractable, without status epilepticus: Secondary | ICD-10-CM

## 2016-06-13 MED ORDER — ONDANSETRON HCL 4 MG PO TABS: 4.0000 mg | ORAL_TABLET | Freq: Two times a day (BID) | ORAL | 0 refills | Status: DC | PRN

## 2016-06-13 MED ORDER — LEVETIRACETAM 750 MG PO TABS: 1500.0000 mg | ORAL_TABLET | Freq: Two times a day (BID) | ORAL | 4 refills | Status: DC

## 2016-06-13 MED ORDER — CARBAMAZEPINE ER 400 MG PO TB12: 400.0000 mg | ORAL_TABLET | Freq: Two times a day (BID) | ORAL | 4 refills | Status: DC

## 2016-06-13 NOTE — Telephone Encounter (Signed)
Patient called to request anti-nausea medication for motion sickness sensation. Will send in short term supply of zofran.   Meds ordered this encounter  Medications  . ondansetron (ZOFRAN) 4 MG tablet    Sig: Take 1 tablet (4 mg total) by mouth 2 (two) times daily as needed for nausea or vomiting.    Dispense:  30 tablet    Refill:  0   Suanne MarkerVIKRAM R. PENUMALLI, MD 06/13/2016, 3:42 PM Certified in Neurology, Neurophysiology and Neuroimaging  [facility] [address], [address] [place], [place] [id number] [phone]

## 2016-06-13 NOTE — Patient Instructions (Signed)
-   reduce ibuprofen to 5-10 days per month to avoid analgesic overuse headaches  - refer to sleep studies (insomnia, interrupted sleep, excessive daytime fatigue, headaches)  - reduce caffeine intake (especially later in the day)

## 2016-06-13 NOTE — Progress Notes (Signed)
[facility]  PATIENT: Ricky Humphrey DOB: [birth year]  REFERRING CLINICIAN:  HISTORY FROM: patient REASON FOR VISIT: follow up   HISTORICAL  CHIEF COMPLAINT:  Chief Complaint  Patient presents with  . Localization-related epilepsy    rm 7, "no seizure activity"  . Follow-up    HISTORY OF PRESENT ILLNESS:   UPDATE 06/13/16: Since last visit, HA are daily. Taking daily ibuprofen. Having interrupted sleep and excessive daytime fatigue. Drinks 1-2 cups coffee in AM, and 1 soda in evening. No seizures.  UPDATE 10/16/15: Since last visit, no seizures. Tolerating meds. Good compliance. Unfortunately, he was at work, bent down to reach for a package, and then struck his head on extender platform from the truck. He had severe pain, but no LOC. This happened 09/06/15. Went to ER, CT head showed no acute findings. Arachnoid cyst slightly larger than 2013, but no major findings.  UPDATE 04/23/15: Since last visit no sz. Last sz Oct 2014. Tolerating meds without issues. Very good compliance with medications.  UPDATE 07/17/14: Since last visit, doing well and no seizures. Asking about possibility of applying for driver's license. Tolerating medications. No new events of issues.   UPDATE 06/20/13: Since last visit, patient was doing well until breakthrough seizure on 06/15/13. Apparently his pharmacy changed supplier of Carbatrol to a different manufacturer and did not give patient full refill of medication. Patient ended up missing 3 successive doses of Carbatrol and then had multiple back-to-back seizures without return of consciousness. Patient was admitted to the hospital, monitored in the intensive care unit, discharged one day later. Since that time patient has returned to baseline. He still feels a little but sore, especially his tongue. Patient acknowledges missing intermittent doses of medication over the past one year, but usually without any consequence.  Patient continues to have problems with insomnia and shifted sleep cycle. He typically goes to sleep at 3 AM and wakes up at noon or 2 PM. He drinks alcohol on social basis.  UPDATE 03/02/12: Doing about the same. No new seizures. Tolerating meds. Asking about driving.   UPDATE 11/19/11: On 09/23/11, pt admitted for breakthrough sz. He had run out of carbatrol, and level was undetectable when checked. Also dx'd with aflutter and has followed up with Dr. Jacinto HalimGanji. No further events.  PRIOR HPI: 25 year old left-handed male with history of mild cerebral palsy, here for evaluation of seizure disorder. Patient is product of full-term gestation, 18 hour labor, attempted vaginal delivery with foreceps, with emergent c-section.  Patient developed normally from cognitive standpoint. He had mild right-sided incoordination.  Neuroimaging study demonstrated left brain cyst, and patient was diagnosed with cerebral palsy. Patient had no further problems until age 25 years old, when he was freshman in high school, when he had a new onset generalized convulsive seizure with tongue biting and incontinence at school. He went to the emergency room and a second seizure in the CT scan. He started on Keppra.  He continued to have seizures once per week.  Due to the presence of left brain cyst, he underwent left brain craniotomy, with unsuccessful cyst resection.  Then Carbatrol was added to his regimen. His seizure frequency declined to once every 6-9 months. He had breakthrough seizure in April 2012 with concomitant strep infection. Most recent breakthrough seizure was 2 days ago on 07/27/11. Nowadays typical seizures consist of right arm posturing and convulsions, followed by loss of consciousness.   REVIEW OF SYSTEMS: Full 14 system review of systems performed  and notable only for memory loss tremors.    ALLERGIES: No Known Allergies  HOME MEDICATIONS: Outpatient Medications Prior to Visit  Medication Sig Dispense Refill  .  carbamazepine (TEGRETOL XR) 400 MG 12 hr tablet Take 1 tablet (400 mg total) by mouth 2 (two) times daily. 180 tablet 4  . ibuprofen (ADVIL,MOTRIN) 800 MG tablet Take 1 tablet (800 mg total) by mouth every 8 (eight) hours as needed for mild pain. 30 tablet 0  . levETIRAcetam (KEPPRA) 750 MG tablet Take 2 tablets (1,500 mg total) by mouth 2 (two) times daily. 360 tablet 4   No facility-administered medications prior to visit.     PAST MEDICAL HISTORY: Past Medical History:  Diagnosis Date  . Arachnoid cyst    s/p drainage by craniotomy  . Cerebral palsy (HCC)    mild  . Epilepsy (HCC)   . Paroxysmal atrial flutter (HCC)    brief, post sz on 09/22/11  . Seizures (HCC)     PAST SURGICAL HISTORY: Past Surgical History:  Procedure Laterality Date  . acrnoid cyst      FAMILY HISTORY: Family History  Problem Relation Age of Onset  . Migraines Mother   . Diabetes Mother     SOCIAL HISTORY:  Social History   Social History  . Marital status: Single    Spouse name: N/A  . Number of children: 0  . Years of education: College   Occupational History  . Student    Social History Main Topics  . Smoking status: Never Smoker  . Smokeless tobacco: Never Used  . Alcohol use 0.0 oz/week     Comment: occasional  . Drug use: No  . Sexual activity: Not on file   Other Topics Concern  . Not on file   Social History Narrative   Patient lives with his girlfriend.   Archivist at [employer]    Caffeine Use: 2-3 cups daily     PHYSICAL EXAM  Vitals:   06/13/16 1133  BP: 123/71  Pulse: (!) 59  Weight: 195 lb 9.6 oz (88.7 kg)    Not recorded      Body mass index is 24.45 kg/m.  GENERAL EXAM: Patient is in no distress  CARDIOVASCULAR: Regular rate and rhythm, no murmurs, no carotid bruits  NEUROLOGIC: MENTAL STATUS: awake, alert, language fluent, comprehension intact, naming intact CRANIAL NERVE: pupils equal and reactive to light, visual fields full to  confrontation, extraocular muscles intact, no nystagmus, facial sensation and strength symmetric, uvula midline, shoulder shrug symmetric, tongue midline. MOTOR: normal bulk and tone, full strength in the BUE, BLE SENSORY: normal and symmetric to light touch, pinprick, temperature, vibration COORDINATION: finger-nose-finger, fine finger movements normal; SLOW TAPPING IN RUE AND RLE.  REFLEXES: deep tendon reflexes present and symmetric GAIT/STATION: narrow based gait; romberg is negative    DIAGNOSTIC DATA (LABS, IMAGING, TESTING) - I reviewed patient records, labs, notes, testing and imaging myself where available.  Lab Results  Component Value Date   WBC 19.5 (H) 07/08/2015   HGB 14.6 07/08/2015   HCT 42.2 07/08/2015   MCV 90.8 07/08/2015   PLT 269 07/08/2015      Component Value Date/Time   NA 138 07/09/2015 0006   NA 141 04/23/2015 1255   K 4.0 07/09/2015 0006   CL 109 07/09/2015 0006   CO2 26 07/09/2015 0006   GLUCOSE 103 (H) 07/09/2015 0006   BUN 10 07/09/2015 0006   BUN 14 04/23/2015 1255   CREATININE 1.30 (H) 07/09/2015  0006   CALCIUM 8.2 (L) 07/09/2015 0006   PROT 6.7 04/23/2015 1255   ALBUMIN 3.9 04/23/2015 1255   AST 24 04/23/2015 1255   ALT 20 04/23/2015 1255   ALKPHOS 71 04/23/2015 1255   BILITOT 0.3 04/23/2015 1255   GFRNONAA >60 07/09/2015 0006   GFRAA >60 07/09/2015 0006   No results found for: CHOL No results found for: HGBA1C No results found for: VITAMINB12 Lab Results  Component Value Date   TSH 1.167 09/22/2011    09/22/11 CT head  - Prior left temporal craniotomy. Large CSF attenuation collection extra-axial in left parietal region, consistent with arachnoid cyst. Few linear areas of slightly higher attenuation are seen within the anterior aspect of this collection, question septations, uncertain etiology; consider either comparison to prior outside exams to assess stability or further evaluation by MR imaging with and without contrast to  assess.  09/06/15 CT head  - No acute intracranial hemorrhage. Left parietal arachnoid cyst grossly stable or minimally increased compared to the prior study.    ASSESSMENT AND PLAN  25 y.o.  left-handed male with mild cerebral palsy and complex partial seizures with secondary generalization.  Patient was having breakthrough seizures every 6-9 months. Now has been sz free since Oct 2014 with better dosing of anti-seizure medications.  Also with post-traumatic headaches since Sep 06, 2015; CT head and neuro exam unremarkable. Advised conservative mgmt for now.   Dx:  Localization-related epilepsy (HCC)  Post-traumatic headache, not intractable, unspecified chronicity pattern  Insomnia, unspecified type  Excessive daytime sleepiness    PLAN: - continue LEV 1500mg  BID - continue CBZ XR 400mg  BID - reduce ibuprofen to 5-10 days per month to avoid analgesic overuse headaches - refer to sleep studies (insomnia, interrupted sleep, excessive daytime fatigue, headaches) - reduce caffeine intake (especially later in the day)  Orders Placed This Encounter  Procedures  . CBC with Differential/Platelet  . CMP  . TSH  . T4, Free  . Ambulatory referral to Sleep Studies   Meds ordered this encounter  Medications  . levETIRAcetam (KEPPRA) 750 MG tablet    Sig: Take 2 tablets (1,500 mg total) by mouth 2 (two) times daily.    Dispense:  360 tablet    Refill:  4  . carbamazepine (TEGRETOL XR) 400 MG 12 hr tablet    Sig: Take 1 tablet (400 mg total) by mouth 2 (two) times daily.    Dispense:  180 tablet    Refill:  4   Return in about 6 months (around 12/12/2016).    Suanne Marker, MD 06/13/2016, 11:39 AM Certified in Neurology, Neurophysiology and Neuroimaging  [facility] [address], [address] [place], [place] [address] [phone]

## 2016-06-14 ENCOUNTER — Ambulatory Visit (INDEPENDENT_AMBULATORY_CARE_PROVIDER_SITE_OTHER): Payer: 59 | Admitting: Physician Assistant

## 2016-06-14 VITALS — BP 120/84 | HR 76 | Temp 99.5°F | Resp 18 | Ht 75.0 in | Wt 195.0 lb

## 2016-06-14 DIAGNOSIS — G809 Cerebral palsy, unspecified: Secondary | ICD-10-CM | POA: Diagnosis not present

## 2016-06-14 DIAGNOSIS — E162 Hypoglycemia, unspecified: Secondary | ICD-10-CM

## 2016-06-14 DIAGNOSIS — J309 Allergic rhinitis, unspecified: Secondary | ICD-10-CM

## 2016-06-14 DIAGNOSIS — Z23 Encounter for immunization: Secondary | ICD-10-CM

## 2016-06-14 DIAGNOSIS — R5383 Other fatigue: Secondary | ICD-10-CM

## 2016-06-14 DIAGNOSIS — G44321 Chronic post-traumatic headache, intractable: Secondary | ICD-10-CM | POA: Diagnosis not present

## 2016-06-14 DIAGNOSIS — G44329 Chronic post-traumatic headache, not intractable: Secondary | ICD-10-CM | POA: Insufficient documentation

## 2016-06-14 DIAGNOSIS — G8929 Other chronic pain: Secondary | ICD-10-CM

## 2016-06-14 DIAGNOSIS — Z8679 Personal history of other diseases of the circulatory system: Secondary | ICD-10-CM

## 2016-06-14 DIAGNOSIS — R51 Headache: Secondary | ICD-10-CM

## 2016-06-14 DIAGNOSIS — R519 Headache, unspecified: Secondary | ICD-10-CM

## 2016-06-14 LAB — COMPREHENSIVE METABOLIC PANEL
A/G RATIO: 1.4 (ref 1.2–2.2)
ALBUMIN: 3.9 g/dL (ref 3.5–5.5)
ALK PHOS: 68 IU/L (ref 39–117)
ALT: 41 IU/L (ref 0–44)
AST: 46 IU/L — AB (ref 0–40)
BUN / CREAT RATIO: 10 (ref 9–20)
BUN: 13 mg/dL (ref 6–20)
Bilirubin Total: 0.5 mg/dL (ref 0.0–1.2)
CALCIUM: 9.1 mg/dL (ref 8.7–10.2)
CO2: 28 mmol/L (ref 18–29)
Chloride: 103 mmol/L (ref 96–106)
Creatinine, Ser: 1.26 mg/dL (ref 0.76–1.27)
GFR calc Af Amer: 92 mL/min/{1.73_m2} (ref >59)
GFR, EST NON AFRICAN AMERICAN: 79 mL/min/{1.73_m2} (ref >59)
GLOBULIN, TOTAL: 2.7 g/dL (ref 1.5–4.5)
Glucose: 62 mg/dL — ABNORMAL LOW (ref 65–99)
POTASSIUM: 4.7 mmol/L (ref 3.5–5.2)
SODIUM: 142 mmol/L (ref 134–144)
Total Protein: 6.6 g/dL (ref 6.0–8.5)

## 2016-06-14 LAB — CBC WITH DIFFERENTIAL/PLATELET
BASOS ABS: 0 10*3/uL (ref 0.0–0.2)
Basos: 1 %
EOS (ABSOLUTE): 0.2 10*3/uL (ref 0.0–0.4)
Eos: 5 %
HEMATOCRIT: 40.5 % (ref 37.5–51.0)
HEMOGLOBIN: 13.9 g/dL (ref 12.6–17.7)
Immature Grans (Abs): 0 10*3/uL (ref 0.0–0.1)
Immature Granulocytes: 0 %
LYMPHS: 27 %
Lymphocytes Absolute: 1.5 10*3/uL (ref 0.7–3.1)
MCH: 32.2 pg (ref 26.6–33.0)
MCHC: 34.3 g/dL (ref 31.5–35.7)
MCV: 94 fL (ref 79–97)
Monocytes Absolute: 0.4 10*3/uL (ref 0.1–0.9)
Monocytes: 8 %
NEUTROS PCT: 59 %
Neutrophils Absolute: 3.2 10*3/uL (ref 1.4–7.0)
Platelets: 236 10*3/uL (ref 150–379)
RBC: 4.32 x10E6/uL (ref 4.14–5.80)
RDW: 13.4 % (ref 12.3–15.4)
WBC: 5.3 10*3/uL (ref 3.4–10.8)

## 2016-06-14 LAB — GLUCOSE, POCT (MANUAL RESULT ENTRY): POC Glucose: 82 mg/dl (ref 70–99)

## 2016-06-14 LAB — TSH: TSH: 0.618 u[IU]/mL (ref 0.450–4.500)

## 2016-06-14 LAB — HIV ANTIBODY (ROUTINE TESTING W REFLEX): HIV 1&2 Ab, 4th Generation: NONREACTIVE

## 2016-06-14 LAB — T4, FREE: FREE T4: 1 ng/dL (ref 0.82–1.77)

## 2016-06-14 LAB — POCT GLYCOSYLATED HEMOGLOBIN (HGB A1C): HEMOGLOBIN A1C: 4.8

## 2016-06-14 MED ORDER — AZELASTINE HCL 0.15 % NA SOLN: 2.0000 | Freq: Two times a day (BID) | NASAL | 0 refills | Status: DC

## 2016-06-14 NOTE — Progress Notes (Signed)
Patient ID: Ricky Humphrey, male    DOB: 08-30-1990, 25 y.o.   MRN: [id number]  PCP: Pcp Not In System  Subjective:   Chief Complaint  Patient presents with  . Headache  . Fatigue  . Flu Vaccine    HPI Presents for evaluation of headache and fatigue. Accompanied by his mother.  Mother made him come in today. Off and on "allergies" Complains of headache, more frequent recently. Feels tired and fatigued. Weight seems to have dropped off.  3 weeks of fatigue. Not sleeping well. "I just lay there." "Try to get comfortable," but is just restless. Estimates 6-8 hours of sleep/night presently. Labor intensive job as a Designer, television/film set at [employer].  Car sickness is a new development. HA "all the time" Saw Dr. Marjory Lies yesterday. Thought possibly related to medications, caffeine, motion sickness, injury about a year ago (hit his head, sustained a concussion). Prescribed ondansetron, but hasn't picked it up yet. Also uses ibuprofen, advised that he cut back. Sleep study scheduled (they have initial evaluation on Monday 10/23). CBC, CMET, TSH and T4 drawn, but they do not yet know the results.   Review of Systems  Constitutional: Positive for fatigue. Negative for activity change, appetite change, chills, diaphoresis, fever and unexpected weight change.  HENT: Positive for congestion, rhinorrhea and sinus pressure. Negative for ear discharge, nosebleeds, postnasal drip, sneezing, sore throat and trouble swallowing.   Eyes: Negative for pain, discharge, itching and visual disturbance.  Respiratory: Negative.   Cardiovascular: Negative.   Gastrointestinal: Positive for nausea.  Endocrine: Negative.   Genitourinary: Negative.   Musculoskeletal: Negative.   Skin: Negative.   Allergic/Immunologic: Positive for environmental allergies. Negative for food allergies and immunocompromised state.  Neurological: Positive for headaches. Negative for dizziness, weakness and light-headedness.    Hematological: Negative.   Psychiatric/Behavioral: Positive for sleep disturbance. Negative for dysphoric mood. The patient is not nervous/anxious.        Patient Active Problem List   Diagnosis Date Noted  . Convulsions/seizures (HCC) 06/15/2013  . Atrial flutter (HCC) 09/23/2011     Prior to Admission medications   Medication Sig Start Date End Date Taking? Authorizing Provider  carbamazepine (TEGRETOL XR) 400 MG 12 hr tablet Take 1 tablet (400 mg total) by mouth 2 (two) times daily. 06/13/16  Yes Suanne Marker, MD  ibuprofen (ADVIL,MOTRIN) 800 MG tablet Take 1 tablet (800 mg total) by mouth every 8 (eight) hours as needed for mild pain. 04/25/16  Yes Kristen N Ward, DO  levETIRAcetam (KEPPRA) 750 MG tablet Take 2 tablets (1,500 mg total) by mouth 2 (two) times daily. 06/13/16  Yes Suanne Marker, MD  ondansetron (ZOFRAN) 4 MG tablet Take 1 tablet (4 mg total) by mouth 2 (two) times daily as needed for nausea or vomiting. 06/13/16  Yes Suanne Marker, MD     No Known Allergies     Objective:  Physical Exam  Constitutional: He is oriented to person, place, and time. He appears well-developed and well-nourished. He is cooperative. No distress.  BP 120/84 (BP Location: Right Arm, Patient Position: Sitting, Cuff Size: Small)   Pulse 76   Temp 99.5 F (37.5 C) (Oral)   Resp 18   Ht 6\' 3"  (1.905 m)   Wt 195 lb (88.5 kg)   SpO2 100%   BMI 24.37 kg/m   Wt Readings from Last 3 Encounters: 06/14/16 : 195 lb (88.5 kg) 06/13/16 : 195 lb 9.6 oz (88.7 kg) 04/24/16 : 188 lb (85.3 kg)  He fell asleep several times during visit, even while I was speaking to him.  HENT:  Head: Normocephalic and atraumatic.  Right Ear: Hearing, tympanic membrane, external ear and ear canal normal.  Left Ear: Hearing, tympanic membrane, external ear and ear canal normal.  Nose: Nose normal.  Mouth/Throat: Oropharynx is clear and moist and mucous membranes are normal.  Eyes:  Conjunctivae, EOM and lids are normal. Pupils are equal, round, and reactive to light. No scleral icterus.  Neck: Normal range of motion, full passive range of motion without pain and phonation normal. Neck supple. No thyromegaly present.  Cardiovascular: Normal rate, regular rhythm and normal heart sounds.   Pulses:      Radial pulses are 2+ on the right side, and 2+ on the left side.  Pulmonary/Chest: Effort normal and breath sounds normal.  Lymphadenopathy:       Head (right side): No tonsillar, no preauricular, no posterior auricular and no occipital adenopathy present.       Head (left side): No tonsillar, no preauricular, no posterior auricular and no occipital adenopathy present.    He has no cervical adenopathy.       Right: No supraclavicular adenopathy present.       Left: No supraclavicular adenopathy present.  Neurological: He is alert and oriented to person, place, and time. No sensory deficit.  Skin: Skin is warm, dry and intact. No rash noted. No cyanosis or erythema. Nails show no clubbing.  Psychiatric: He has a normal mood and affect. His speech is normal and behavior is normal.    We reviewed the labs together. As glucose was 62, and he has a brother who suffers from hypoglycemia, elected to recheck that today along with A1C.    Results for orders placed or performed in visit on 06/14/16  POCT glucose (manual entry)  Result Value Ref Range   POC Glucose 82 70 - 99 mg/dl  POCT glycosylated hemoglobin (Hb A1C)  Result Value Ref Range   Hemoglobin A1C 4.8        Assessment & Plan:   1. Fatigue, unspecified type No etiology identified with CBC, CMET, TSH/T4, glucose/A1C. Add HIV. Proceed with sleep study. - HIV antibody  2. Chronic intractable headache, unspecified headache type Chronic daily HA. Encouraged reduction in NSAID use. Improved sleep will likely help as well.  3. Hypoglycemia Normal glucose and A1C today. - POCT glucose (manual entry) - POCT  glycosylated hemoglobin (Hb A1C)  4. Allergic rhinitis, unspecified chronicity, unspecified seasonality, unspecified trigger Advised that nasonex is maintenance and works less well as a PRN medication. Encouraged daily use of Nasonex, with PRN Azelastine NS and Claritin. - Azelastine HCl 0.15 % SOLN; Place 2 sprays into both nostrils 2 (two) times daily.  Dispense: 30 mL; Refill: 0  5. Need for prophylactic vaccination and inoculation against influenza - Flu Vaccine QUAD 36+ mos IM  6. Need for Tdap vaccination - Tdap vaccine greater than or equal to 7yo IM   Fernande Brashelle S. Zadie Deemer, PA-C Physician Assistant-Certified [facility]

## 2016-06-14 NOTE — Patient Instructions (Addendum)
There is no evidence of diabetes. Use the Nasonex EVERY DAY as maintenance for your allergies. Use the new nasal spray as needed.    IF you received an x-ray today, you will receive an invoice from [facility]. Please contact [facility] at [phone] with questions or concerns regarding your invoice.   IF you received labwork today, you will receive an invoice from [facility]. Please contact [facility] at [phone] with questions or concerns regarding your invoice.   Our billing staff will not be able to assist you with questions regarding bills from these companies.  You will be contacted with the lab results as soon as they are available. The fastest way to get your results is to activate your My Chart account. Instructions are located on the last page of this paperwork. If you have not heard from us regarding the results in 2 weeks, please contact this office.

## 2016-06-16 ENCOUNTER — Encounter: Payer: Self-pay | Admitting: Neurology

## 2016-06-16 ENCOUNTER — Ambulatory Visit (INDEPENDENT_AMBULATORY_CARE_PROVIDER_SITE_OTHER): Payer: 59 | Admitting: Neurology

## 2016-06-16 VITALS — BP 138/70 | HR 70 | Resp 16 | Ht 75.0 in | Wt 200.0 lb

## 2016-06-16 DIAGNOSIS — R51 Headache: Secondary | ICD-10-CM | POA: Diagnosis not present

## 2016-06-16 DIAGNOSIS — R0683 Snoring: Secondary | ICD-10-CM | POA: Diagnosis not present

## 2016-06-16 DIAGNOSIS — G40109 Localization-related (focal) (partial) symptomatic epilepsy and epileptic syndromes with simple partial seizures, not intractable, without status epilepticus: Secondary | ICD-10-CM

## 2016-06-16 DIAGNOSIS — R519 Headache, unspecified: Secondary | ICD-10-CM

## 2016-06-16 DIAGNOSIS — R351 Nocturia: Secondary | ICD-10-CM

## 2016-06-16 DIAGNOSIS — R400 Somnolence: Secondary | ICD-10-CM

## 2016-06-16 MED FILL — AZELASTINE 0.15% NASAL SPRY: 0.15 | 30 days supply | Qty: 30 | Fill #0

## 2016-06-16 NOTE — Patient Instructions (Addendum)

## 2016-06-16 NOTE — Progress Notes (Signed)
Subjective:    Patient ID: Rayford HalstedChristopher A Matus is a 25 y.o. male.  HPI     Huston FoleySaima Ladeidra Borys, MD, PhD [facility] [address], [address] [address] [place], [place] [address]  Dear Satira SarkVikram,   I saw your patient, Keane PoliceChristopher Montone, upon your kind request in my clinic today for initial consultation of his sleep disorder, in particular, daytime somnolence and difficulty with sleep maintenance. The patient is accompanied by his mother today. As you know, Mr. 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 Has not tried PM type medications, for fear of lowering Sz threshold. He drives, last Sz may be 1-2 years ago, reports compliance with AEDs.  No FHx of OSA. No FHx of insomnia. No overt RLS symptoms, but tosses and turns a lot.  ESS is 15/24, FSS is 47/63. Weight has been fairly stable.  I reviewed your office note from 06/13/2016. He had a sleep study at [facility] a several years ago, when he was in high school. Study results are not available for my review today.His sleep study was part of his evaluation for seizures at the time. It sounds like he had an admission for video EEG monitoring.   his bedtime is between 9:30 and 10 PM. Wakeup time is around 6 AM for work. He works for [employer]. He lives with his girlfriend. He is a nonsmoker and quit drinking alcohol in 2015. He drinks caffeine in the form of coffee, typically 2 cups in the morning and occasional soda or tea. He has nocturia about once per night on average, wakes up with a headache 3-4 times per week on average and has been using ibuprofen occasionally.   His Past Medical History Is Significant For: Past Medical History:   Diagnosis Date  . Arachnoid cyst    s/p drainage by craniotomy  . Cerebral palsy (HCC)    mild  . Epilepsy (HCC)   . Headache   . Paroxysmal atrial flutter (HCC)    brief, post sz on 09/22/11  . Seizures (HCC)     His Past Surgical History Is Significant For: Past Surgical History:  Procedure Laterality Date  . acrnoid cyst    . TONSILLECTOMY AND ADENOIDECTOMY      His Family History Is Significant For: Family History  Problem Relation Age of Onset  . Migraines Mother   . Diabetes Mother     His Social History Is Significant For: Social History   Social History  . Marital status: Single    Spouse name: girlfriend  . Number of children: 0  . Years of education: College   Occupational History  . Loader     [employer]   Social History Main Topics  . Smoking status: Never Smoker  . Smokeless tobacco: Never Used  . Alcohol use No  . Drug use: No  . Sexual activity: Not Asked   Other Topics Concern  . None   Social History Narrative   Patient lives with his girlfriend.   [facility] [employer].    Caffeine Use: 2-3 cups daily    His Allergies Are:  No Known Allergies:   His Current Medications Are:  Outpatient Encounter Prescriptions as of 06/16/2016  Medication Sig  . Azelastine HCl 0.15 % SOLN Place 2 sprays into  both nostrils 2 (two) times daily.  . carbamazepine (TEGRETOL XR) 400 MG 12 hr tablet Take 1 tablet (400 mg total) by mouth 2 (two) times daily.  Marland Kitchen ibuprofen (ADVIL,MOTRIN) 800 MG tablet Take 1 tablet (800 mg total) by mouth every 8 (eight) hours as needed for mild pain.  Marland Kitchen levETIRAcetam (KEPPRA) 750 MG tablet Take 2 tablets (1,500 mg total) by mouth 2 (two) times daily.  . ondansetron (ZOFRAN) 4 MG tablet Take 1 tablet (4 mg total) by mouth 2 (two) times daily as needed for nausea or vomiting.   No facility-administered encounter medications on file as of 06/16/2016.   :  Review of Systems:  Out of a complete 14 point review of systems, all are reviewed  and negative with the exception of these symptoms as listed below: Review of Systems  Neurological:       Patient reports that he had a sleep study done at [facility] during his freshman year of high school. He was told that he did not have sleep apnea.  Has trouble falling and staying asleep, snoring, wakes up feeling tired, daytime fatigue, morning headaches, takes naps.   Epworth Sleepiness Scale 0= would never doze 1= slight chance of dozing 2= moderate chance of dozing 3= high chance of dozing  Sitting and reading:3 Watching TV:1 Sitting inactive in a public place (ex. Theater or meeting):2 As a passenger in a car for an hour without a break:3 Lying down to rest in the afternoon:3 Sitting and talking to someone:0 Sitting quietly after lunch (no alcohol):3 In a car, while stopped in traffic:0 Total:15   Objective:  Neurologic Exam  Physical Exam Physical Examination:   Vitals:   06/16/16 0857  BP: 138/70  Pulse: 70  Resp: 16   General Examination: The patient is a very pleasant 25 y.o. male in no acute distress. He appears well-developed and well-nourished and well groomed.   HEENT: Normocephalic, atraumatic, with the exception of small right parietal scar , pupils are equal, round and reactive to light and accommodation. Funduscopic exam is normal with sharp disc margins noted. Extraocular tracking is good without limitation to gaze excursion or nystagmus noted. Normal smooth pursuit is noted. Hearing is grossly intact. Tympanic membranes are clear bilaterally. Face is symmetric with normal facial animation and normal facial sensation. Speech is clear with no dysarthria noted. There is no hypophonia. There is no lip, neck/head, jaw or voice tremor. Neck is supple with full range of passive and active motion. There are no carotid bruits on auscultation. Oropharynx exam reveals: mild mouth dryness, good dental hygiene and mild airway crowding, due to larger appearing tongue.  Mallampati is class I. Tongue protrudes centrally and palate elevates symmetrically. Tonsils are absent. Neck size is 15 5/8 inches. He has a Mild overbite. Nasal inspection reveals no significant nasal mucosal bogginess or redness and no septal deviation.   Chest: Clear to auscultation without wheezing, rhonchi or crackles noted.  Heart: S1+S2+0, regular and normal without murmurs, rubs or gallops noted.   Abdomen: Soft, non-tender and non-distended with normal bowel sounds appreciated on auscultation.  Extremities: There is no pitting edema in the distal lower extremities bilaterally. Pedal pulses are intact.  Skin: Warm and dry without trophic changes noted. There are no varicose veins.  Musculoskeletal: exam reveals no obvious joint deformities, tenderness or joint swelling or erythema.   Neurologically:  Mental status: The patient is awake, alert and oriented in all 4 spheres. His immediate and remote memory, attention, language skills  and fund of knowledge are appropriate. There is no evidence of aphasia, agnosia, apraxia or anomia. Speech is clear with normal prosody and enunciation. Thought process is linear. Mood is normal and affect is normal.  Cranial nerves II - XII are as described above under HEENT exam. In addition: shoulder shrug is normal with equal shoulder height noted. Motor exam: Normal bulk, strength and tone is noted. There is no drift, tremor or rebound. Romberg is negative. Reflexes are 2+ throughout. Fine motor skills and coordination: intact with normal finger taps, normal hand movements, normal rapid alternating patting, normal foot taps and normal foot agility.  Cerebellar testing: No dysmetria or intention tremor on finger to nose testing. Heel to shin is unremarkable bilaterally. There is no truncal or gait ataxia.  Sensory exam: intact to light touch, pinprick, vibration, temperature sense in the upper and lower extremities.  Gait, station and balance: He stands  easily. No veering to one side is noted. No leaning to one side is noted. Posture is age-appropriate and stance is narrow based. Gait shows normal stride length and normal pace. No problems turning are noted. Tandem walk is unremarkable.    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 I had a long chat with the patient and his mom about my findings and the diagnosis of OSA, its prognosis and treatment options. We talked about medical treatments, surgical interventions and non-pharmacological approaches. I explained in particular the risks and ramifications of untreated moderate to severe OSA, especially with respect to developing cardiovascular disease down the Road, including congestive heart failure, difficult to treat hypertension, cardiac arrhythmias, or stroke. Even type 2 diabetes has, in part, been linked to untreated OSA. Symptoms of untreated OSA include daytime sleepiness, memory problems, mood irritability and mood disorder such as depression and anxiety, lack of energy, as well as recurrent headaches, especially morning headaches. We talked about trying to maintain a healthy lifestyle in general, as well as the importance of weight control. I encouraged the patient to eat healthy, exercise daily and keep well hydrated, to keep a scheduled bedtime and wake time routine, to not skip any meals and eat healthy snacks in between meals. I advised the patient not to drive when feeling sleepy. I recommended the following at this time: sleep study with potential positive airway pressure titration. (We will score hypopneas at 3% and split the sleep study into diagnostic and treatment  portion, if the estimated. 2 hour AHI is >15/h).   I explained the sleep test procedure to the patient and also outlined possible surgical and non-surgical treatment options of OSA, including the use of a custom-made dental device (which would require a referral to a specialist dentist or oral surgeon), upper airway surgical options, such as pillar implants, radiofrequency surgery, tongue base surgery, and UPPP (which would involve a referral to an ENT surgeon). Rarely, jaw surgery such as mandibular advancement may be considered.  I also explained the CPAP treatment option to the patient, who indicated that he would be willing to try CPAP if the need arises. I explained the importance of being compliant with PAP treatment, not only for insurance purposes but primarily to improve His symptoms, and for the patient's long term health  benefit, including to reduce His cardiovascular risks. I answered all their questions today and the patient and his mother were in agreement. I would like to see him back after the sleep study is completed and encouraged him to call with any interim questions, concerns, problems or updates.   Thank you very much for allowing me to participate in the care of this nice patient. If I can be of any further assistance to you please do not hesitate to talk to me.  Sincerely,   Huston Foley, MD, PhD

## 2016-06-17 ENCOUNTER — Telehealth: Payer: Self-pay | Admitting: *Deleted

## 2016-06-17 NOTE — Telephone Encounter (Signed)
Per Dr Marjory LiesPenumalli, LVM advising patient his lab results are okay with the exception of elevated white cell count. Advised he needs to establish a PCP or go to urgent care ot get this checked further. Requested he call back to discuss. Left name, number.

## 2016-06-18 ENCOUNTER — Telehealth: Payer: Self-pay | Admitting: Emergency Medicine

## 2016-06-18 NOTE — Telephone Encounter (Signed)
Noreene LarssonJill S/[facility] [phone] called said the pt's mother said she rec'd a call from neurologist last night that the white count is elevated and needs to follow up with PCP. She said in looking at the labs on 10/21 the CBC was normal.  She is confused, please call

## 2016-06-18 NOTE — Telephone Encounter (Addendum)
[facility]. Was on hold; call was ended. Called back, on hold > 5 minutes. Will attempt to reach later.  LVM for mother apologizing for the error on this RN's part. Explained this RN was mistakenly looking at the previous lab results, not the labs drawn in this office last week. Requested she inform her son as well.  Apologized again and left this RN's name, number if she would want to call back.

## 2016-06-18 NOTE — Telephone Encounter (Signed)
Mother called in saying Neurologist office left message on her VM that her son WBC was elevated and he needed to f/u with his PCP.   Per last office visit 06/13/16 with Dr. Marjory LiesPenumalli, wbc was normal.  Doristine SectionMary Clair, RN will give me with more details.

## 2016-06-18 NOTE — Telephone Encounter (Signed)
Noreene LarssonJill a nurse at [facility] is calling back and would like a returned call. Noreene LarssonJill states the patient's mother is anxious to hear from her.

## 2016-06-18 NOTE — Telephone Encounter (Signed)
Mother called requesting to speak with nurse regarding lab results. Please call [phone] (DPR on file)

## 2016-06-18 NOTE — Telephone Encounter (Signed)
LVM informing Noreene LarssonJill this RN had attempted to reach her twice this morning. Advised her this RN had mistakenly looked at the wrong lab results. Also explained this RN LVM for patient's mother prior to noon apologizing and explaining the same to her. Deri Fuellingdvised Jill to call back if this RN needs to do anything further re: call mother, call patient. Left name, number and expressed apology again to AureliaJill.

## 2016-06-19 ENCOUNTER — Telehealth: Payer: Self-pay | Admitting: *Deleted

## 2016-06-19 NOTE — Telephone Encounter (Signed)
Per Dr Marjory LiesPenumalli, spoke with patient and advised him overall his lab results are unremarkable labs. WBC improved. AST borderline. Dr Marjory LiesPenumalli will follow-up at next lab check in 6 months and continue current plan.  Apologized again for this RN's mistake of looking  at older lab results. Explained to patient this RN LVM for his mother and the nurse at urgent care apologizing and informing them of the same. He verbalized understanding of call.

## 2016-06-23 MED FILL — ONDANSETRON HCL 4 MG TABLET: 4 | 15 days supply | Qty: 30 | Fill #0

## 2016-06-23 MED FILL — CARBAMAZEPINE ER 400 MG TAB: 400 | 30 days supply | Qty: 60 | Fill #0

## 2016-06-24 MED FILL — levETIRAcetam 750 MG TABS: 750 | 90 days supply | Qty: 360 | Fill #0

## 2016-07-31 ENCOUNTER — Ambulatory Visit (INDEPENDENT_AMBULATORY_CARE_PROVIDER_SITE_OTHER): Payer: 59 | Admitting: Neurology

## 2016-07-31 DIAGNOSIS — G4720 Circadian rhythm sleep disorder, unspecified type: Secondary | ICD-10-CM

## 2016-07-31 DIAGNOSIS — G4710 Hypersomnia, unspecified: Secondary | ICD-10-CM

## 2016-07-31 DIAGNOSIS — G4761 Periodic limb movement disorder: Secondary | ICD-10-CM

## 2016-08-01 MED FILL — CARBAMAZEPINE ER 400 MG TAB: 400 | 30 days supply | Qty: 60 | Fill #1

## 2016-08-07 ENCOUNTER — Telehealth: Payer: Self-pay

## 2016-08-07 NOTE — Telephone Encounter (Signed)
I called pt to discuss sleep study results. No answer, left a message asking pt to call me back. 

## 2016-08-07 NOTE — Telephone Encounter (Signed)
-----   Message from Huston FoleySaima Athar, MD sent at 08/07/2016  8:24 AM EST ----- Patient referred by Dr. Marjory LiesPenumalli, seen by me on 06/16/16, diagnostic PSG on 07/31/16.   Please call and notify the patient that the recent sleep study did not show any significant obstructive sleep apnea. He had moderate leg movements, but no significant sleep disruption from PLMs; nevertheless, he had some sleep fragmentation, mild snoring.  Please inform patient that I would like to go over the details of the study during a follow up appointment. Arrange a followup appointment. Also, route or fax report to PCP and referring MD, if other than PCP.  Once you have spoken to patient, you can close this encounter.   Thanks,  Huston FoleySaima Athar, MD, PhD [facility]

## 2016-08-07 NOTE — Progress Notes (Signed)
Patient referred by Dr. Marjory LiesPenumalli, seen by me on 06/16/16, diagnostic PSG on 07/31/16.   Please call and notify the patient that the recent sleep study did not show any significant obstructive sleep apnea. He had moderate leg movements, but no significant sleep disruption from PLMs; nevertheless, he had some sleep fragmentation, mild snoring.  Please inform patient that I would like to go over the details of the study during a follow up appointment. Arrange a followup appointment. Also, route or fax report to PCP and referring MD, if other than PCP.  Once you have spoken to patient, you can close this encounter.   Thanks,  Huston FoleySaima Leyda Vanderwerf, MD, PhD [facility]

## 2016-08-07 NOTE — Procedures (Signed)
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 The patient endorsed the Epworth Sleepiness Scale at 15 points. The patient's weight 200 pounds with a height of 75 (inches), resulting in a BMI of 24.9 kg/m2. The patient's neck circumference measured 15.5 inches.  CURRENT MEDICATIONS: Soln, Tegretol, Motrin, Keppra, Zofran   PROCEDURE:  This is a multichannel digital polysomnogram utilizing the Somnostar 11.2 system.  Electrodes and sensors were applied and monitored per AASM Specifications.   EEG, EOG, Chin and Limb EMG, were sampled at 200 Hz.  ECG, Snore and Nasal Pressure, Thermal Airflow, Respiratory Effort, CPAP Flow and Pressure, Oximetry was sampled at 50 Hz. Digital video and audio were recorded.      BASELINE STUDY  Lights Out was at 21:05 and Lights On at 05:03.  Total recording time (TRT) was 479 minutes, with a total sleep time (TST) of  409 minutes.   The patient's sleep latency was 28 minutes.  REM latency was 95 minutes, which is normal.  The sleep efficiency was 85.4 %.     SLEEP ARCHITECTURE: WASO (Wake after sleep onset) was 41.5 minutes with mild to moderate sleep fragmentation noted.  There were 63 minutes in Stage N1, 188.5 minutes Stage N2, 82 minutes Stage N3 and 75.5 minutes in Stage REM.  The percentage of Stage N1 was 15.4%, which is increased, Stage N2 was 46.1%, which is normal, Stage N3 was 20.%, which is normal, and Stage R (REM sleep) was 18.5%, which is near normal.   Audio and video analysis did not show any abnormal or unusual movements,  behaviors, phonations or vocalizations. The patient took 1 bathroom break. Mild intermittent snoring was noted. The EKG was in keeping with normal sinus rhythm (NSR).  RESPIRATORY ANALYSIS:  There were a total of 6 respiratory events:  1 obstructive apneas, 0 central apneas and 0 mixed apneas with a total of 1 apneas and an apnea index (AI) of .1 /hour. There were 5 hypopneas with a hypopnea index of .7 /hour. The patient also had 0 respiratory event related arousals (RERAs).      The total APNEA/HYPOPNEA INDEX (AHI) was .9/hour and the total RESPIRATORY DISTURBANCE INDEX was .9 /hour.  2 events occurred in REM sleep and 6 events in NREM. The REM AHI was 1.6 /hour, versus a non-REM AHI of .7. The patient spent 247 minutes of total sleep time in the supine position and 162 minutes in non-supine.. The supine AHI was 1.4 versus a non-supine AHI of 0.0.  OXYGEN SATURATION & C02:  The Wake baseline 02 saturation was 96%, with the lowest being 89%. Time spent below 89% saturation equaled 0 minutes. Some artifacts including loss of signal was noted (O2 nadir of 60% is an error).   PERIODIC LIMB MOVEMENTS:   The patient had a total of 208 Periodic Limb Movements.  The Periodic Limb Movement (PLM) index was 30.5 and the PLM Arousal index was .7/hour.  Post-study, the patient indicated that sleep was the same as usual.   IMPRESSION:  1. Periodic Limb Movement Disorder (PLMD) 2. Dysfunctions associated  with sleep stages or arousal from sleep  RECOMMENDATIONS:  1. This study does not demonstrate any significant obstructive or central sleep disordered breathing and only mild intermittent snoring. This study does not support OSA as a cause of the patient's symptoms. Other causes, including circadian rhythm disturbances, an underlying mood disorder, medication effect and/or an underlying medical problem cannot be ruled out. 2. Moderate PLMs (periodic limb movements of sleep) were noted during the study  without significant arousals; clinical correlation is recommended. 3. The patient should be cautioned not to drive, work at heights, or operate dangerous or heavy equipment when tired or sleepy. Review and reiteration of good sleep hygiene measures should be pursued with any patient. 4. This study shows sleep fragmentation and abnormal sleep stage percentages; these are nonspecific findings and per se do not signify an intrinsic sleep disorder or a cause for the patient's sleep-related symptoms. Causes include (but are not limited to) the first night effect of the sleep study, circadian rhythm disturbances, medication effect or an underlying mood disorder or medical problem.  5. The patient will be seen in [facility] for discussion of the test results and further management strategies. The referring provider will be notified of the test results.   I certify that I have reviewed the entire raw data recording prior to the issuance of this report in accordance with the Standards of Accreditation of the American Academy of Sleep Medicine (AASM)    Huston FoleySaima Litha Lamartina, MD, PhD Diplomat, American Board of Psychiatry and Neurology (Neurology and Sleep Medicine)

## 2016-08-07 NOTE — Telephone Encounter (Signed)
I called pt again to discuss sleep study results. No answer, left a message asking him to call me back. 

## 2016-08-12 NOTE — Telephone Encounter (Signed)
I called pt again, no answer, left a message asking him to call me back. 

## 2016-08-13 NOTE — Telephone Encounter (Signed)
I spoke to pt and advised him that his sleep study results did not show significant sleep apnea but did show moderate leg movements, mild snoring, and some sleep fragmentation. Pt is agreeable to a follow up to discuss the sleep results. I offered pt a follow up tomorrow 08/14/2016 but pt declined. Pt is agreeable to the next open office visit of 11/06/16 at 3:00pm with Dr. Frances FurbishAthar. Pt verbalized understanding of results. Pt had no questions at this time but was encouraged to call back if questions arise.

## 2016-08-21 ENCOUNTER — Ambulatory Visit: Payer: 59

## 2016-09-04 MED FILL — CARBAMAZEPINE ER 400 MG TAB: 400 | 30 days supply | Qty: 60 | Fill #2

## 2016-10-07 MED FILL — CARBAMAZEPINE XR 400 MG TAB: 400 | 30 days supply | Qty: 60 | Fill #3

## 2016-10-07 MED FILL — levETIRAcetam 750 MG TABS: 750 | 30 days supply | Qty: 120 | Fill #1

## 2016-10-09 ENCOUNTER — Telehealth: Payer: Self-pay | Admitting: *Deleted

## 2016-10-09 NOTE — Telephone Encounter (Signed)
LVM requesting patient to call back and reschedule his April follow up due to provider being out of the office. Advised phone staff may reschedule; left number.

## 2016-10-13 ENCOUNTER — Encounter: Payer: Self-pay | Admitting: Diagnostic Neuroimaging

## 2016-11-06 ENCOUNTER — Encounter: Payer: Self-pay | Admitting: Neurology

## 2016-11-06 ENCOUNTER — Ambulatory Visit (INDEPENDENT_AMBULATORY_CARE_PROVIDER_SITE_OTHER): Payer: 59 | Admitting: Neurology

## 2016-11-06 VITALS — BP 122/78 | HR 80 | Resp 16 | Ht 75.0 in | Wt 203.0 lb

## 2016-11-06 DIAGNOSIS — G4761 Periodic limb movement disorder: Secondary | ICD-10-CM | POA: Diagnosis not present

## 2016-11-06 NOTE — Progress Notes (Signed)
Subjective:    Patient ID: Ricky Humphrey is a 26 y.o. male.  HPI     Interim history:   Ricky Humphrey is a 26 year old right-handed gentleman with an underlying medical history of cerebral arachnoid cyst, status post drainage by craniotomy, epilepsy, paroxysmal atrial flutter, recurrent headaches and history of mild cerebral palsy, who presents for follow-up consultation of his sleep disturbance, after his recent sleep study. The patient is accompanied by his mother today, but she had to leave for another appt. I first met him on 06/16/2016 at the request of Dr. Leta Baptist, at which time he reported snoring, morning headaches, difficulty with sleep onset and sleep maintenance. I invited him for sleep study. He had a baseline sleep study on 07/31/2016. I went over his test results with him in detail today. Sleep efficiency was 85.4%, sleep latency was 28 minutes, REM latency was 95 minutes, normal. He had a normal percentage of stage II sleep, normal percentage of slow-wave sleep and near normal percentage of REM sleep at 18.5%. He had mild intermittent snoring. Total AHI was 0.9 per hour, REM AHI 1.6 per hour, supine AHI was 1.4 per hour, average oxygen saturation was 96%, nadir was 89%. 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  Today, 11/06/2016 (all dictated new, as well as above notes, some dictation done in note pad or Word, outside of chart, may appear as copied):   He reports doing about the same. He works for [employer]. He tries to keep a schedule. It is difficult for him to go to sleep or staying asleep sometimes. He feels that he slept fairly average during sleep study. He has no new complaints. He is supposed to make a follow-up appointment with Dr. Leta Baptist in April for his 6 monthly checkup. He denies any restless leg symptoms.   The patient's allergies, current medications, family history, past medical history, past social history, past  surgical history and problem list were reviewed and updated as appropriate.   Previously (copied from previous notes for reference):   06/16/2016: He reports snoring, morning headaches, difficulty with sleep onset and sleep maintenance. Has not tried PM type medications, for fear of lowering Sz threshold. He drives, last Sz may be 1-2 years ago, reports compliance with AEDs.  No FHx of OSA. No FHx of insomnia. No overt RLS symptoms, but tosses and turns a lot.  ESS is 15/24, FSS is 47/63. Weight has been fairly stable.  I reviewed your office note from 06/13/2016. He had a sleep study at [facility] a several years ago, when he was in high school. Study results are not available for my review today.His sleep study was part of his evaluation for seizures at the time. It sounds like he had an admission for video EEG monitoring.   his bedtime is between 9:30 and 10 PM. Wakeup time is around 6 AM for work. He works for [employer]. He lives with his girlfriend. He is a nonsmoker and quit drinking alcohol in 2015. He drinks caffeine in the form of coffee, typically 2 cups in the morning and occasional soda or tea. He has nocturia about once per night on average, wakes up with a headache 3-4 times per week on average and has been using ibuprofen occasionally.   His Past Medical History Is Significant For: Past Medical History:  Diagnosis Date  . Arachnoid cyst    s/p drainage by craniotomy  . Cerebral palsy ([place])  mild  . Epilepsy ([place])   . Headache   . Paroxysmal atrial flutter ([place])    brief, post sz on 09/22/11  . Seizures ([place])     His Past Surgical History Is Significant For: Past Surgical History:  Procedure Laterality Date  . acrnoid cyst    . TONSILLECTOMY AND ADENOIDECTOMY      His Family History Is Significant For: Family History  Problem Relation Age of Onset  . Migraines Mother   . Diabetes Mother     His Social History Is Significant For: Social History   Social History   . Marital status: Single    Spouse name: girlfriend  . Number of children: 0  . Years of education: College   Occupational History  . Loader     [employer]   Social History Main Topics  . Smoking status: Never Smoker  . Smokeless tobacco: Never Used  . Alcohol use No  . Drug use: No  . Sexual activity: Not Asked   Other Topics Concern  . None   Social History Narrative   Patient lives with his girlfriend.   College at [employer].    Caffeine Use: 2-3 cups daily    His Allergies Are:  No Known Allergies:   His Current Medications Are:  Outpatient Encounter Prescriptions as of 11/06/2016  Medication Sig  . Azelastine HCl 0.15 % SOLN Place 2 sprays into both nostrils 2 (two) times daily.  . carbamazepine (TEGRETOL XR) 400 MG 12 hr tablet Take 1 tablet (400 mg total) by mouth 2 (two) times daily.  Marland Kitchen ibuprofen (ADVIL,MOTRIN) 800 MG tablet Take 1 tablet (800 mg total) by mouth every 8 (eight) hours as needed for mild pain.  Marland Kitchen levETIRAcetam (KEPPRA) 750 MG tablet Take 2 tablets (1,500 mg total) by mouth 2 (two) times daily.  . ondansetron (ZOFRAN) 4 MG tablet Take 1 tablet (4 mg total) by mouth 2 (two) times daily as needed for nausea or vomiting.   No facility-administered encounter medications on file as of 11/06/2016.   :  Review of Systems:  Out of a complete 14 point review of systems, all are reviewed and negative with the exception of these symptoms as listed below:  Review of Systems  Neurological:       Patient is here to discuss his sleep study. No new concerns.     Objective:  Neurologic Exam  Physical Exam Physical Examination:   Vitals:   11/06/16 1512  BP: 122/78  Pulse: 80  Resp: 16    General Examination: The patient is a very pleasant 26 y.o. male in no acute distress. He appears well-developed and well-nourished and adequately groomed.   HEENT: Normocephalic, atraumatic, pupils are equal, round and reactive to light and accommodation. Extraocular tracking  is good without limitation to gaze excursion or nystagmus noted. Normal smooth pursuit is noted. Hearing is grossly intact. Face is symmetric with normal facial animation and normal facial sensation. Speech is clear with no dysarthria noted. There is no hypophonia. There is no lip, neck/head, jaw or voice tremor. Neck is supple with full range of passive and active motion. There are no carotid bruits on auscultation. Oropharynx exam reveals: mild mouth dryness, good dental hygiene and mild airway crowding. Mallampati is class I. Tongue protrudes centrally and palate elevates symmetrically. Tonsils are absent.   Chest: Clear to auscultation without wheezing, rhonchi or crackles noted.  Heart: S1+S2+0, regular and normal without murmurs, rubs or gallops noted.   Abdomen: Soft, non-tender and  non-distended with normal bowel sounds appreciated on auscultation.  Extremities: There is no edema in the distal lower extremities bilaterally.  Skin: Warm and dry without trophic changes noted.  Musculoskeletal: exam reveals no obvious joint deformities, tenderness or joint swelling or erythema.   Neurologically:  Mental status: The patient is awake, alert and oriented in all 4 spheres. His immediate and remote memory, attention, language skills and fund of knowledge are appropriate. There is no evidence of aphasia, agnosia, apraxia or anomia. Speech is clear with normal prosody and enunciation. Thought process is linear. Mood is normal and affect is normal.  Cranial nerves II - XII are as described above under HEENT exam. In addition: shoulder shrug is normal with equal shoulder height noted. Motor exam: Normal bulk, strength and tone is noted. There is no drift, tremor or rebound. Romberg is negative. Reflexes are 2+ throughout. Fine motor skills and coordination: intact. There is no truncal or gait ataxia.  Sensory exam: intact to light touch in the upper and lower extremities.  Gait, station and balance:  He stands easily. No veering to one side is noted. No leaning to one side is noted. Posture is age-appropriate and stance is narrow based. Gait shows normal stride length and normal pace. No problems turning are noted. Tandem walk is unremarkable.   Assessment and Plan:    In summary, Ricky Humphrey is a very pleasant 26 y.o.-year old male with an Underlying medical history of cerebral arachnoid cyst, status post craniotomy, history of epilepsy, recurrent headaches, history of cerebral palsy, paroxysmal atrial flutter, who presents for follow-up consultation after his sleep study. His baseline sleep study in December 2017 showed no significant sleep disordered breathing and very mild snoring. He slept fairly well during the study. He had moderate PLMS with minimal arousals, denies restless leg symptoms. I reviewed good sleep habits with him today. We reviewed his test results in detail. All his questions were answered. I suggested as needed follow-up in sleep clinic. He is encouraged to make his follow-up appointment with Dr. Leta Baptist as planned for individual April or early May.  Physical exam is stable. Neurological exam is nonfocal. I answered all their questions today and the patient and his mom were in agreement.  I spent 20 minutes in total face-to-face time with the patient, more than 50% of which was spent in counseling and coordination of care, reviewing test results, reviewing medication and discussing or reviewing the diagnosis of PLMD, its prognosis and treatment options. Pertinent laboratory and imaging test results that were available during this visit with the patient were reviewed by me and considered in my medical decision making (see chart for details).

## 2016-11-06 NOTE — Patient Instructions (Signed)
Your sleep study did not show any significant obstructive sleep apnea.  You had moderate leg twitching in sleep, which did not disturb your sleep.  You can be monitored for restless legs symptoms.   Please remember to try to maintain good sleep hygiene, which means: Keep a regular sleep and wake schedule, try not to exercise or have a meal within 2 hours of your bedtime, try to keep your bedroom conducive for sleep, that is, cool and dark, without light distractors such as an illuminated alarm clock, and refrain from watching TV right before sleep or in the middle of the night and do not keep the TV or radio on during the night. Also, try not to use or play on electronic devices at bedtime, such as your cell phone, tablet PC or laptop. If you like to read at bedtime on an electronic device, try to dim the background light as much as possible. Do not eat in the middle of the night.   I will see you back as needed. Keep your follow up with Dr. Marjory LiesPenumalli as planned, sometime late April or early May.

## 2016-11-08 MED FILL — levETIRAcetam 750 MG TABS: 750 | 30 days supply | Qty: 120 | Fill #2

## 2016-11-10 MED FILL — CARBAMAZEPINE XR 400 MG TAB: 400 | 30 days supply | Qty: 60 | Fill #4

## 2016-12-05 MED FILL — levETIRAcetam 750 MG TABS: 750 | 30 days supply | Qty: 120 | Fill #3

## 2016-12-05 MED FILL — CARBAMAZEPINE XR 400 MG TAB: 400 | 30 days supply | Qty: 60 | Fill #5

## 2016-12-12 ENCOUNTER — Ambulatory Visit: Payer: 59 | Admitting: Diagnostic Neuroimaging

## 2016-12-26 ENCOUNTER — Ambulatory Visit: Payer: 59 | Admitting: Diagnostic Neuroimaging

## 2016-12-29 ENCOUNTER — Encounter: Payer: Self-pay | Admitting: Diagnostic Neuroimaging

## 2017-01-07 MED FILL — CARBAMAZEPINE XR 400 MG TAB: 400 | 30 days supply | Qty: 60 | Fill #6

## 2017-01-07 MED FILL — levETIRAcetam 750 MG TABS: 750 | 30 days supply | Qty: 120 | Fill #4

## 2017-02-09 MED FILL — levETIRAcetam 750 MG TABS: 750 | 30 days supply | Qty: 120 | Fill #5

## 2017-02-09 MED FILL — CARBAMAZEPINE XR 400 MG TAB: 400 | 30 days supply | Qty: 60 | Fill #7

## 2017-03-13 MED FILL — CARBAMAZEPINE XR 400 MG TAB: 400 | 30 days supply | Qty: 60 | Fill #8

## 2017-03-13 MED FILL — levETIRAcetam 750 MG TABS: 750 | 30 days supply | Qty: 120 | Fill #6

## 2017-03-17 ENCOUNTER — Other Ambulatory Visit: Payer: Self-pay | Admitting: *Deleted

## 2017-03-18 NOTE — Telephone Encounter (Signed)
[facility] [place]  REF # [id number].  [place], [place] [id number]  03/18/2017 thru 03/13/2018.  Carbamazepine XR 400mg  tablets.  (1 tablet po bid).   [facility] medication.

## 2017-04-14 MED FILL — levETIRAcetam 750 MG TABS: 750 | 30 days supply | Qty: 120 | Fill #7

## 2017-04-14 MED FILL — CARBAMAZEPINE XR 400 MG TAB: 400 | 30 days supply | Qty: 60 | Fill #9

## 2017-05-08 ENCOUNTER — Encounter (HOSPITAL_COMMUNITY): Payer: Self-pay | Admitting: Emergency Medicine

## 2017-05-08 ENCOUNTER — Observation Stay (HOSPITAL_COMMUNITY)
Admit: 2017-05-08 | Discharge: 2017-05-08 | Disposition: A | Discharge status: Home / Self Care | Payer: Medicaid Other | Attending: Neurology | Admitting: Neurology

## 2017-05-08 ENCOUNTER — Observation Stay (HOSPITAL_COMMUNITY): Payer: Medicaid Other

## 2017-05-08 ENCOUNTER — Emergency Department (HOSPITAL_COMMUNITY): Payer: Medicaid Other

## 2017-05-08 ENCOUNTER — Observation Stay (HOSPITAL_COMMUNITY)
Admission: EM | Admit: 2017-05-08 | Discharge: 2017-05-09 | Disposition: A | Discharge status: Home / Self Care | Payer: Medicaid Other | Attending: Internal Medicine | Admitting: Internal Medicine

## 2017-05-08 DIAGNOSIS — G44329 Chronic post-traumatic headache, not intractable: Secondary | ICD-10-CM | POA: Diagnosis not present

## 2017-05-08 DIAGNOSIS — G40909 Epilepsy, unspecified, not intractable, without status epilepticus: Secondary | ICD-10-CM | POA: Diagnosis not present

## 2017-05-08 DIAGNOSIS — W06XXXA Fall from bed, initial encounter: Secondary | ICD-10-CM | POA: Diagnosis not present

## 2017-05-08 DIAGNOSIS — G802 Spastic hemiplegic cerebral palsy: Secondary | ICD-10-CM

## 2017-05-08 DIAGNOSIS — Z8679 Personal history of other diseases of the circulatory system: Secondary | ICD-10-CM | POA: Diagnosis not present

## 2017-05-08 DIAGNOSIS — Z8669 Personal history of other diseases of the nervous system and sense organs: Secondary | ICD-10-CM | POA: Insufficient documentation

## 2017-05-08 DIAGNOSIS — Z833 Family history of diabetes mellitus: Secondary | ICD-10-CM | POA: Insufficient documentation

## 2017-05-08 DIAGNOSIS — Z79899 Other long term (current) drug therapy: Secondary | ICD-10-CM | POA: Diagnosis not present

## 2017-05-08 DIAGNOSIS — I480 Paroxysmal atrial fibrillation: Secondary | ICD-10-CM | POA: Insufficient documentation

## 2017-05-08 DIAGNOSIS — I4892 Unspecified atrial flutter: Secondary | ICD-10-CM | POA: Diagnosis not present

## 2017-05-08 DIAGNOSIS — Z820 Family history of epilepsy and other diseases of the nervous system: Secondary | ICD-10-CM | POA: Insufficient documentation

## 2017-05-08 DIAGNOSIS — G9389 Other specified disorders of brain: Secondary | ICD-10-CM | POA: Insufficient documentation

## 2017-05-08 DIAGNOSIS — G809 Cerebral palsy, unspecified: Secondary | ICD-10-CM | POA: Insufficient documentation

## 2017-05-08 DIAGNOSIS — R569 Unspecified convulsions: Secondary | ICD-10-CM

## 2017-05-08 DIAGNOSIS — R9431 Abnormal electrocardiogram [ECG] [EKG]: Secondary | ICD-10-CM | POA: Diagnosis not present

## 2017-05-08 LAB — BASIC METABOLIC PANEL
Anion gap: 6 (ref 5–15)
BUN: 18 mg/dL (ref 6–20)
CHLORIDE: 110 mmol/L (ref 101–111)
CO2: 25 mmol/L (ref 22–32)
Calcium: 8.7 mg/dL — ABNORMAL LOW (ref 8.9–10.3)
Creatinine, Ser: 1.23 mg/dL (ref 0.61–1.24)
GFR calc Af Amer: >60 mL/min (ref >60)
GFR calc non Af Amer: >60 mL/min (ref >60)
Glucose, Bld: 92 mg/dL (ref 65–99)
Potassium: 4.3 mmol/L (ref 3.5–5.1)
SODIUM: 141 mmol/L (ref 135–145)

## 2017-05-08 LAB — CBC WITH DIFFERENTIAL/PLATELET
Basophils Absolute: 0 10*3/uL (ref 0.0–0.1)
Basophils Relative: 1 %
EOS ABS: 0.3 10*3/uL (ref 0.0–0.7)
EOS PCT: 4 %
HEMATOCRIT: 39.5 % (ref 39.0–52.0)
Hemoglobin: 13.6 g/dL (ref 13.0–17.0)
LYMPHS ABS: 1.1 10*3/uL (ref 0.7–4.0)
Lymphocytes Relative: 18 %
MCH: 32.1 pg (ref 26.0–34.0)
MCHC: 34.4 g/dL (ref 30.0–36.0)
MCV: 93.2 fL (ref 78.0–100.0)
MONOS PCT: 10 %
Monocytes Absolute: 0.6 10*3/uL (ref 0.1–1.0)
Neutro Abs: 4.2 10*3/uL (ref 1.7–7.7)
Neutrophils Relative %: 67 %
PLATELETS: 226 10*3/uL (ref 150–400)
RBC: 4.24 MIL/uL (ref 4.22–5.81)
RDW: 12.6 % (ref 11.5–15.5)
WBC: 6.3 10*3/uL (ref 4.0–10.5)

## 2017-05-08 LAB — PHOSPHORUS: Phosphorus: 2.7 mg/dL (ref 2.5–4.6)

## 2017-05-08 LAB — MAGNESIUM: Magnesium: 2 mg/dL (ref 1.7–2.4)

## 2017-05-08 LAB — CARBAMAZEPINE LEVEL, TOTAL

## 2017-05-08 MED ORDER — CARBAMAZEPINE 100 MG PO CHEW
400.0000 mg | CHEWABLE_TABLET | Freq: Once | ORAL | Status: AC
  Administered 2017-05-08: 400 mg via ORAL
  Filled 2017-05-08: qty 4

## 2017-05-08 MED ORDER — TRAMADOL HCL 50 MG PO TABS
50.0000 mg | ORAL_TABLET | Freq: Four times a day (QID) | ORAL | Status: DC | PRN
  Administered 2017-05-08: 50 mg via ORAL
  Filled 2017-05-08: qty 1

## 2017-05-08 MED ORDER — ACETAMINOPHEN 500 MG PO TABS: 500.0000 mg | ORAL_TABLET | Freq: Four times a day (QID) | ORAL | Status: DC | PRN

## 2017-05-08 MED ORDER — CARBAMAZEPINE ER 200 MG PO TB12
400.0000 mg | ORAL_TABLET | Freq: Two times a day (BID) | ORAL | Status: DC
  Administered 2017-05-08: 400 mg via ORAL
  Filled 2017-05-08: qty 2

## 2017-05-08 MED ORDER — MENTHOL 3 MG MT LOZG
1.0000 | LOZENGE | OROMUCOSAL | Status: DC | PRN
  Filled 2017-05-08: qty 9

## 2017-05-08 MED ORDER — LORAZEPAM 2 MG/ML IJ SOLN
1.0000 mg | Freq: Once | INTRAMUSCULAR | Status: AC
  Administered 2017-05-08: 1 mg via INTRAVENOUS
  Filled 2017-05-08: qty 1

## 2017-05-08 MED ORDER — LEVETIRACETAM 500 MG PO TABS
1500.0000 mg | ORAL_TABLET | Freq: Two times a day (BID) | ORAL | Status: DC
  Administered 2017-05-08 – 2017-05-09 (×2): 1500 mg via ORAL
  Filled 2017-05-08 (×2): qty 3

## 2017-05-08 MED ORDER — SODIUM CHLORIDE 0.9 % IV SOLN
INTRAVENOUS | Status: DC
  Administered 2017-05-09: 04:00:00 via INTRAVENOUS

## 2017-05-08 MED ORDER — IBUPROFEN 200 MG PO TABS
200.0000 mg | ORAL_TABLET | Freq: Once | ORAL | Status: AC
  Administered 2017-05-08: 200 mg via ORAL
  Filled 2017-05-08: qty 1

## 2017-05-08 MED ORDER — ENOXAPARIN SODIUM 40 MG/0.4ML ~~LOC~~ SOLN
40.0000 mg | SUBCUTANEOUS | Status: DC
  Administered 2017-05-08: 40 mg via SUBCUTANEOUS
  Filled 2017-05-08: qty 0.4

## 2017-05-08 MED ORDER — LEVETIRACETAM 500 MG PO TABS: 1500.0000 mg | ORAL_TABLET | Freq: Two times a day (BID) | ORAL | Status: DC

## 2017-05-08 MED ORDER — CARBAMAZEPINE ER 200 MG PO TB12
600.0000 mg | ORAL_TABLET | Freq: Two times a day (BID) | ORAL | Status: DC
  Administered 2017-05-08 – 2017-05-09 (×2): 600 mg via ORAL
  Filled 2017-05-08 (×2): qty 3

## 2017-05-08 MED ORDER — INFLUENZA VAC SPLIT QUAD 0.5 ML IM SUSY
0.5000 mL | PREFILLED_SYRINGE | INTRAMUSCULAR | Status: AC | PRN
  Administered 2017-05-09: 0.5 mL via INTRAMUSCULAR

## 2017-05-08 MED ORDER — GADOBENATE DIMEGLUMINE 529 MG/ML IV SOLN
20.0000 mL | Freq: Once | INTRAVENOUS | Status: AC | PRN
  Administered 2017-05-08: 17 mL via INTRAVENOUS

## 2017-05-08 MED ORDER — SODIUM CHLORIDE 0.9 % IV SOLN
1000.0000 mg | Freq: Once | INTRAVENOUS | Status: AC
  Administered 2017-05-08: 1000 mg via INTRAVENOUS
  Filled 2017-05-08: qty 10

## 2017-05-08 NOTE — ED Triage Notes (Signed)
Pt brought in by EMS  Pt had two back to back seizures that lasted one minute long  Pt bit his tongue  No airway compromise  Pt takes keppra

## 2017-05-08 NOTE — ED Provider Notes (Signed)
Patient seen with attending, Dr. Nicanor Alcon. Please see H&P for full history and exam. Briefly, patient with hx of seizures who had two back-to-back seizures prior to arrival and 3rd while in ED. Given ativan and Keppra load. Labs and imaging pending at attending shift change.   Neurology consulted who will follow up in consultation. Admit to medicine recommended.  CT head negative. Labs reassuring.   Hospitalist consulted who will admit.    Stanislaw Acton, Chase Picket, PA-C 05/08/17 8295    Nicanor Alcon, April, MD 05/09/17 6213

## 2017-05-08 NOTE — ED Provider Notes (Signed)
[facility]-EMERGENCY DEPT Provider Note   CSN: [id number] Arrival date & time: 05/08/17  0604     History   Chief Complaint Chief Complaint  Patient presents with  . Seizures    HPI Ricky Humphrey is a 26 y.o. male.  The history is provided by the patient.  Seizures   This is a recurrent problem. The current episode started less than 1 hour ago. The problem has not changed since onset.There were 2 to 3 seizures. The most recent episode lasted 30 to 120 seconds. Associated symptoms include sleepiness and confusion. Characteristics include rhythmic jerking. The episode was witnessed. There was no sensation of an aura present. The seizures continued in the ED. The seizure(s) had no focality. Possible causes do not include missed seizure meds. There has been no fever. There were no medications administered prior to arrival.    Past Medical History:  Diagnosis Date  . Arachnoid cyst    s/p drainage by craniotomy  . Cerebral palsy ([facility])    mild  . Epilepsy ([facility])   . Headache   . Paroxysmal atrial flutter ([facility])    brief, post sz on 09/22/11  . Seizures [facility])     Patient Active Problem List   Diagnosis Date Noted  . Cerebral palsy (H[facility]) 06/14/2016  . 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 Convulsions/seizures ([facility]) 06/15/2013  . History of atrial flutter 09/23/2011    Past Surgical History:  Procedure Laterality Date  . acrnoid cyst    . TONSILLECTOMY AND ADENOIDECTOMY         Home Medications    Prior to Admission medications   Medication Sig Start Date End Date Taking? Authorizing Provider  carbamazepine (TEGRETOL XR) 400 MG 12 hr tablet Take 1 tablet (400 mg total) by mouth 2 (two) times daily. 06/13/16  Yes Penumalli, Glenford Bayley, MD  levETIRAcetam (KEPPRA) 750 MG tablet Take 2 tablets (1,500 mg total) by mouth 2 (two) times daily. 06/13/16  Yes Penumalli, Vikram R, MD  Azelastine HCl 0.15 % SOLN Place 2 sprays into both nostrils 2 (two) times  daily. Patient not taking: Reported on 05/08/2017 06/14/16   Porfirio Oar, PA-C  ibuprofen (ADVIL,MOTRIN) 800 MG tablet Take 1 tablet (800 mg total) by mouth every 8 (eight) hours as needed for mild pain. Patient not taking: Reported on 05/08/2017 04/25/16   Ward, Layla Maw, DO  ondansetron (ZOFRAN) 4 MG tablet Take 1 tablet (4 mg total) by mouth 2 (two) times daily as needed for nausea or vomiting. Patient not taking: Reported on 05/08/2017 06/13/16   Suanne Marker, MD    Family History Family History  Problem Relation Age of Onset  . Migraines Mother   . Diabetes Mother     Social History Social History  Substance Use Topics  . Smoking status: Never Smoker  . Smokeless tobacco: Never Used  . Alcohol use No     Allergies   Patient has no known allergies.   Review of Systems Review of Systems  Neurological: Positive for seizures.  Psychiatric/Behavioral: Positive for confusion.  All other systems reviewed and are negative.    Physical Exam Updated Vital Signs BP 117/67   Pulse 67   Temp 97.8 F (36.6 C)   Resp 15   SpO2 100%   Physical Exam  Constitutional: He appears well-developed and well-nourished. No distress.  HENT:  Head: Normocephalic and atraumatic.  Mouth/Throat: No oropharyngeal exudate.  Eyes: Pupils are equal, round, and reactive to light. Conjunctivae are normal.  Neck: Normal range of motion. Neck supple. No JVD present.  Cardiovascular: Normal rate, regular rhythm, normal heart sounds and intact distal pulses.   Pulmonary/Chest: Effort normal and breath sounds normal. No stridor. He has no wheezes. He has no rales.  Abdominal: Soft. Bowel sounds are normal. He exhibits no mass. There is no tenderness. There is no rebound and no guarding.  Musculoskeletal: Normal range of motion.  Neurological: He is alert. He displays normal reflexes.  Skin: Skin is warm and dry. Capillary refill takes less than 2 seconds.  Psychiatric: He has a normal  mood and affect.     ED Treatments / Results   Vitals:   05/08/17 0609 05/08/17 0614  BP:  117/67  Pulse:  67  Resp:  15  Temp:  97.8 F (36.6 C)  SpO2: 100% 100%     Labs (all labs ordered are listed, but only abnormal results are displayed) Labs Reviewed  CBC WITH DIFFERENTIAL/PLATELET  BASIC METABOLIC PANEL  CARBAMAZEPINE LEVEL, TOTAL    EKG  EKG Interpretation  Date/Time:  Friday May 08 2017 06:14:09 EDT Ventricular Rate:  68 PR Interval:    QRS Duration: 79 QT Interval:  399 QTC Calculation: 425 R Axis:   77 Text Interpretation:  Sinus rhythm ST elevation suggests acute pericarditis Confirmed by Loden Laurent ([id number]) on 05/08/2017 6:19:40 AM       Radiology No results found.  Procedures Procedures (including critical care time)  Medications Ordered in ED Medications  LORazepam (ATIVAN) injection 1 mg (not administered)  levETIRAcetam (KEPPRA) 1,000 mg in sodium chloride 0.9 % 100 mL IVPB (not administered)    Consulted neurology  Final Clinical Impressions(s) / ED Diagnoses  Status epilepticus, will admit to medicine   Doraine Schexnider, MD 05/08/17 [phone]

## 2017-05-08 NOTE — H&P (Addendum)
History and Physical    STRYKER VEASEY BJY:[id number] DOB: 02-Jan-1991 DOA: 05/08/2017  I have briefly reviewed the patient's prior medical records in [facility]  PCP: System, Pcp Not In  Patient coming from: Home  Chief Complaint: Seizures  HPI: Ricky Humphrey is a 26 y.o. 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 fib, mild cerebral palsy, who comes into the hospital brought by family for several seizure episodes at home.  Patient's girlfriend who is at bedside tells me that she has noticed to self-limiting seizure episode about 5 AM this morning.  On arrival to the ED he had recurrent seizure, and the mother who is at bedside (she is a former Occupational psychologist) tells me that he had another seizure.  His seizures usually start in the right upper extremity, is contracted, and progressed to be generalized tonic-clonic and this is what happened this morning.  On my evaluation patient is still mildly postictal however is able to answer orientation questions without difficulties.  He denies any recent fevers or chills, he denies missing home medications, he has had no chest pain or shortness of breath and was otherwise feeling his normal self prior to this morning.  Patient's mother tells me that he recently had dental work done about a week ago, and felt lightheaded afterwards.  Did not have any general anesthesia but she thinks may have had local gum injections.  Up until today, patient has been seizure-free for about a year.  Otherwise he is doing well, lives independently and is a Merchandiser, retail at [employer]  ED Course: In the ER patient was given Ativan which helped his seizure, he was loaded with Keppra and neurology was consulted.  They recommend admission and [facility] was called.  Patient's vital signs are stable, and his blood work is essentially unremarkable.  Review of Systems: As per HPI otherwise  10 point review of systems negative.   Past Medical History:  Diagnosis Date  . Arachnoid cyst    s/p drainage by craniotomy  . Cerebral palsy (HCC)    mild  . Epilepsy (HCC)   . Headache   . Paroxysmal atrial flutter (HCC)    brief, post sz on 09/22/11  . Seizures (HCC)     Past Surgical History:  Procedure Laterality Date  . acrnoid cyst    . TONSILLECTOMY AND ADENOIDECTOMY       reports that he has never smoked. He has never used smokeless tobacco. He reports that he does not drink alcohol or use drugs.  No Known Allergies  Family History  Problem Relation Age of Onset  . Migraines Mother   . Diabetes Mother     Prior to Admission medications   Medication Sig Start Date End Date Taking? Authorizing Provider  carbamazepine (TEGRETOL XR) 400 MG 12 hr tablet Take 1 tablet (400 mg total) by mouth 2 (two) times daily. 06/13/16  Yes Penumalli, Glenford Bayley, MD  levETIRAcetam (KEPPRA) 750 MG tablet Take 2 tablets (1,500 mg total) by mouth 2 (two) times daily. 06/13/16  Yes Penumalli, Vikram R, MD  Azelastine HCl 0.15 % SOLN Place 2 sprays into both nostrils 2 (two) times daily. Patient not taking: Reported on 05/08/2017 06/14/16   Porfirio Oar, PA-C  ibuprofen (ADVIL,MOTRIN) 800 MG tablet Take 1 tablet (800 mg total) by mouth every 8 (eight) hours as needed for mild pain. Patient not taking: Reported on 05/08/2017 04/25/16  Ward, Kristen N, DO  ondansetron (ZOFRAN) 4 MG tablet Take 1 tablet (4 mg total) by mouth 2 (two) times daily as needed for nausea or vomiting. Patient not taking: Reported on 05/08/2017 06/13/16   Suanne Marker, MD    Physical Exam: Vitals:   05/08/17 0614 05/08/17 0727 05/08/17 0800 05/08/17 0830  BP: 117/67 115/69 122/75 124/71  Pulse: 67 60 (!) 54 (!) 59  Resp: Temp: 97.8 F (36.6 C)     SpO2: 100% 100% 100% 100%      Constitutional: NAD, calm, comfortable, sleeping Eyes:  lids and conjunctivae normal ENMT: Mucous membranes  are moist.  Neck: normal, supple Respiratory: clear to auscultation bilaterally, no wheezing, no crackles. Normal respiratory effort. No accessory muscle use.  Cardiovascular: Regular rate and rhythm, no murmurs / rubs / gallops. No extremity edema. 2+ pedal pulses.  Abdomen: no tenderness, no masses palpated. Bowel sounds positive.  Musculoskeletal: no clubbing / cyanosis. Normal muscle tone.  Skin: no rashes, lesions, ulcers. No induration Neurologic: CN 2-12 grossly intact. Strength 5/5 in all 4.  Psychiatric: Normal judgment and insight. Alert and oriented x 3. Normal mood.   Labs on Admission: I have personally reviewed following labs and imaging studies  CBC:  Recent Labs Lab 05/08/17 0648  WBC 6.3  NEUTROABS 4.2  HGB 13.6  HCT 39.5  MCV 93.2  PLT 226   Basic Metabolic Panel:  Recent Labs Lab 05/08/17 0648  NA 141  K 4.3  CL 110  CO2 25  GLUCOSE 92  BUN 18  CREATININE 1.23  CALCIUM 8.7*  MG 2.0  PHOS 2.7   GFR: CrCl cannot be calculated (Unknown ideal weight.). Liver Function Tests: No results for input(s): AST, ALT, ALKPHOS, BILITOT, PROT, ALBUMIN in the last 168 hours. No results for input(s): LIPASE, AMYLASE in the last 168 hours. No results for input(s): AMMONIA in the last 168 hours. Coagulation Profile: No results for input(s): INR, PROTIME in the last 168 hours. Cardiac Enzymes: No results for input(s): CKTOTAL, CKMB, CKMBINDEX, TROPONINI in the last 168 hours. BNP (last 3 results) No results for input(s): PROBNP in the last 8760 hours. HbA1C: No results for input(s): HGBA1C in the last 72 hours. CBG: No results for input(s): GLUCAP in the last 168 hours. Lipid Profile: No results for input(s): CHOL, HDL, LDLCALC, TRIG, CHOLHDL, LDLDIRECT in the last 72 hours. Thyroid Function Tests: No results for input(s): TSH, T4TOTAL, FREET4, T3FREE, THYROIDAB in the last 72 hours. Anemia Panel: No results for input(s): VITAMINB12, FOLATE, FERRITIN, TIBC,  IRON, RETICCTPCT in the last 72 hours. Urine analysis:    Component Value Date/Time   COLORURINE YELLOW 06/15/2013 1216   APPEARANCEUR CLEAR 06/15/2013 1216   LABSPEC 1.020 06/15/2013 1216   PHURINE 5.0 06/15/2013 1216   GLUCOSEU NEGATIVE 06/15/2013 1216   HGBUR MODERATE (A) 06/15/2013 1216   BILIRUBINUR NEGATIVE 06/15/2013 1216   KETONESUR NEGATIVE 06/15/2013 1216   PROTEINUR 30 (A) 06/15/2013 1216   UROBILINOGEN 0.2 06/15/2013 1216   NITRITE NEGATIVE 06/15/2013 1216   LEUKOCYTESUR NEGATIVE 06/15/2013 1216     Radiological Exams on Admission: Ct Head Wo Contrast  Result Date: 05/08/2017 CLINICAL DATA:  Recent seizures.  History of arachnoid cyst. EXAM: CT HEAD WITHOUT CONTRAST TECHNIQUE: Contiguous axial images were obtained from the base of the skull through the vertex without intravenous contrast. COMPARISON:  09/06/2015 and 07/08/2015 FINDINGS: Brain: Ventricles and cisterns are within normal. There is evidence of patient's known arachnoid cyst  over the left parietal region without significant change. This has mild mass effect unchanged. No evidence of midline shift or acute hemorrhage. No evidence of acute infarction. Vascular: No hyperdense vessel or unexpected calcification. Skull:  Evidence of previous left parietal craniotomy. Sinuses/Orbits: No acute finding. Other: None. IMPRESSION: No acute intracranial findings. Stable prominent arachnoid cyst over the left parietal region. Previous left parietal craniotomy. Electronically Signed   By: Elberta Fortis M.D.   On: 05/08/2017 07:24    EKG: Independently reviewed.  Sinus rhythm  Assessment/Plan Active Problems:   History of atrial flutter   Seizure (HCC)   Seizure disorder -Patient was loaded with Keppra, continue Keppra for now at home dose -he is on carbamazepine at home, check a level -Neurology has been consulted, discussed with Dr. Otelia Limes -EEG  History of a flutter -Had an episode of self-limiting a flutter in  January 2013. -Monitor on telemetry -Has been evaluated by cardiology as an outpatient, was recommended a stress test however patient's mother tells me the patient has declined    DVT prophylaxis: Lovenox  Code Status: Full code  Family Communication: mother and girlfriend at bedside Disposition Plan: Admit to telemetry Consults called: Neurology    Admission status: Observation   At the point of initial evaluation, it is my clinical opinion that admission for OBSERVATION is reasonable and necessary because the patient's presenting complaints in the context of their chronic conditions represent sufficient risk of deterioration or significant morbidity to constitute reasonable grounds for close observation in the hospital setting, but that the patient may be medically stable for discharge from the hospital within 24 to 48 hours.    Pamella Pert, MD [facility] Pager [phone]  If 7PM-7AM, please contact night-coverage [email or web address] Password [email or web address]  05/08/2017, 8:59 AM

## 2017-05-08 NOTE — ED Notes (Signed)
Pt sleeping. No signs of distress noted.

## 2017-05-08 NOTE — ED Notes (Signed)
Delay in transport related to neurology PA at bedside.

## 2017-05-08 NOTE — ED Notes (Signed)
Call Irving Burton [phone] @ 0900 for report

## 2017-05-08 NOTE — Consult Note (Signed)
Reason for Consult: Seizures Referring Physician: Dr Elvera Lennox   CC: seizures  HPI: Ricky Humphrey is an 26 y.o. male with a history of cerebral palsy, seizures, paroxysmal atrial flutter, and a history of an arachnoid cyst with previous craniotomy. The patient, for his seizures, is maintained on Tegretol 400 mg every 12 hours and Keppra 1500 mg every 12 hours for his seizure history. Some of the history is given by his fianc and she reports that he is religious in taking his medications as prescribed. Recently he has been under some increased stress with a promotion at work and the announcement that his fiance is pregnant. This morning at approximately 5:30 the patient was sleeping when he sat up suddenly called his fianc's name and then had a generalized seizure falling out of bed. The seizure lasted approximately 30-60 seconds. Within minutes he had a second seizure biting his tongue. He was brought to the emergency department. In the emergency room the patient began have tremors of his right upper extremity, where his seizures usually start. The staff was getting ready to give Ativan when the tremor subsided. He was given additional - Keppra 1000 mg IV. The patient currently is comfortable, a Tegretol level is pending, an EEG has been ordered. He and his fiance deny any recent fevers, infections, or any changes in his medical status. The patient and his fiance report that it has been years since any other seizure activity.  Past Medical History:  Diagnosis Date  . Arachnoid cyst    s/p drainage by craniotomy  . Cerebral palsy (HCC)    mild  . Epilepsy (HCC)   . Headache   . Paroxysmal atrial flutter (HCC)    brief, post sz on 09/22/11  . Seizures (HCC)     Past Surgical History:  Procedure Laterality Date  . acrnoid cyst    . TONSILLECTOMY AND ADENOIDECTOMY      Family History  Problem Relation Age of Onset  . Migraines Mother   . Diabetes Mother     Social History:   reports that he has never smoked. He has never used smokeless tobacco. He reports that he does not drink alcohol or use drugs. The patient works as a Production designer, theatre/television/film at [employer] and recently had a significant promotion.  No Known Allergies  Medications:  No current facility-administered medications for this encounter.    Current Outpatient Prescriptions  Medication Sig Dispense Refill  . carbamazepine (TEGRETOL XR) 400 MG 12 hr tablet Take 1 tablet (400 mg total) by mouth 2 (two) times daily. 180 tablet 4  . levETIRAcetam (KEPPRA) 750 MG tablet Take 2 tablets (1,500 mg total) by mouth 2 (two) times daily. 360 tablet 4  . Azelastine HCl 0.15 % SOLN Place 2 sprays into both nostrils 2 (two) times daily. (Patient not taking: Reported on 05/08/2017) 30 mL 0  . ibuprofen (ADVIL,MOTRIN) 800 MG tablet Take 1 tablet (800 mg total) by mouth every 8 (eight) hours as needed for mild pain. (Patient not taking: Reported on 05/08/2017) 30 tablet 0  . ondansetron (ZOFRAN) 4 MG tablet Take 1 tablet (4 mg total) by mouth 2 (two) times daily as needed for nausea or vomiting. (Patient not taking: Reported on 05/08/2017) 30 tablet 0   I have reviewed the patient's current medications.  ROS: History obtained from the patient and fianc  General ROS: negative for - chills, fatigue, fever, night sweats, weight gain or weight loss. Positive for some increased emotional stress otherwise a negative review  of systems. Psychological ROS: negative for - behavioral disorder, hallucinations, memory difficulties, mood swings or suicidal ideation Ophthalmic ROS: negative for - blurry vision, double vision, eye pain or loss of vision ENT ROS: negative for - epistaxis, nasal discharge, oral lesions, sore throat, tinnitus or vertigo Allergy and Immunology ROS: negative for - hives or itchy/watery eyes Hematological and Lymphatic ROS: negative for - bleeding problems, bruising or swollen lymph nodes Endocrine ROS: negative for - galactorrhea,  hair pattern changes, polydipsia/polyuria or temperature intolerance Respiratory ROS: negative for - cough, hemoptysis, shortness of breath or wheezing Cardiovascular ROS: negative for - chest pain, dyspnea on exertion, edema or irregular heartbeat Gastrointestinal ROS: negative for - abdominal pain, diarrhea, hematemesis, nausea/vomiting or stool incontinence Genito-Urinary ROS: negative for - dysuria, hematuria, incontinence or urinary frequency/urgency Musculoskeletal ROS: negative for - joint swelling or muscular weakness Neurological ROS: as noted in HPI Dermatological ROS: negative for rash and skin lesion changes   Physical Examination: Blood pressure 122/75, pulse (!) 54, temperature 97.8 F (36.6 C), resp. rate 15, SpO2 100 %.  General - well-developed well-nourished male in no acute distress Heart - Regular rate and rhythm  Lungs - Clear to auscultation Abdomen - Soft - non tender Extremities - Distal pulses intact - no edema Skin - Warm and dry  Neurologic Examination: - the patient is left-handed Mental Status:  Alert, oriented, thought content appropriate. Speech without evidence of dysarthria or aphasia. Able to follow 3 step commands without difficulty.  Cranial Nerves:  II-bilateral visual fields intact III/IV/VI-Pupils were equal and reacted. Extraocular movements were full.  V/VII-no facial numbness and no facial weakness.  VIII-hearing normal.  X-normal speech and symmetrical palatal movement.  XII-midline tongue extension  Motor: 5/5 strength symmetrical throughout.  Muscle tone normal throughout. Rt grip 4/5, Lt grip 5/5. When asked to hold his arms extended the pt developed slight spasm and cramping of finger on right hand. Sensory: Intact to light touch in all extremities. Deep Tendon Reflexes: Rt upper and lower extremities -  3/5  ; Lt upper and lower extremities - 1/5 Plantars: Downgoing bilaterally  Cerebellar: Normal finger to nose and heel to shin  bilaterally. Gait: not tested   Laboratory Studies:   Basic Metabolic Panel:  Recent Labs Lab 05/08/17 0648  NA 141  K 4.3  CL 110  CO2 25  GLUCOSE 92  BUN 18  CREATININE 1.23  CALCIUM 8.7*  MG 2.0  PHOS 2.7    Liver Function Tests: No results for input(s): AST, ALT, ALKPHOS, BILITOT, PROT, ALBUMIN in the last 168 hours. No results for input(s): LIPASE, AMYLASE in the last 168 hours. No results for input(s): AMMONIA in the last 168 hours.  CBC:  Recent Labs Lab 05/08/17 0648  WBC 6.3  NEUTROABS 4.2  HGB 13.6  HCT 39.5  MCV 93.2  PLT 226    Cardiac Enzymes: No results for input(s): CKTOTAL, CKMB, CKMBINDEX, TROPONINI in the last 168 hours.  BNP: Invalid input(s): POCBNP  CBG: No results for input(s): GLUCAP in the last 168 hours.  Microbiology: Results for orders placed or performed during the hospital encounter of 06/15/13  MRSA PCR Screening     Status: None   Collection Time: 06/15/13  6:25 PM  Result Value Ref Range Status   MRSA by PCR NEGATIVE NEGATIVE Final    Comment:        The GeneXpert MRSA Assay (FDA approved for NASAL specimens only), is one component of a comprehensive MRSA colonization surveillance program.  It is not intended to diagnose MRSA infection nor to guide or monitor treatment for MRSA infections.    Coagulation Studies: No results for input(s): LABPROT, INR in the last 72 hours.  Urinalysis: No results for input(s): COLORURINE, LABSPEC, PHURINE, GLUCOSEU, HGBUR, BILIRUBINUR, KETONESUR, PROTEINUR, UROBILINOGEN, NITRITE, LEUKOCYTESUR in the last 168 hours.  Invalid input(s): APPERANCEUR  Lipid Panel:  No results found for: CHOL, TRIG, HDL, CHOLHDL, VLDL, LDLCALC  HgbA1C:  Lab Results  Component Value Date   HGBA1C 4.8 06/14/2016    Urine Drug Screen:      Component Value Date/Time   LABOPIA NONE DETECTED 09/22/2011 2328   COCAINSCRNUR NONE DETECTED 09/22/2011 2328   LABBENZ NONE DETECTED 09/22/2011 2328    AMPHETMU NONE DETECTED 09/22/2011 2328   THCU NONE DETECTED 09/22/2011 2328   LABBARB NONE DETECTED 09/22/2011 2328    Alcohol Level: No results for input(s): ETH in the last 168 hours.  Other results: EKG: sinus rhythm rate 64 bpm. ST changes - await cardiology interpretation.  Imaging:  Ct Head Wo Contrast 05/08/2017 No acute intracranial findings. Stable prominent arachnoid cyst over the left parietal region. Previous left parietal craniotomy.   History and examination documented by: Hassel Neth [facility] Pager [phone] 05/08/2017, 10:04 AM  Assessment/Plan: 26 year old male with history of cerebral palsy and seizures. Now with breakthrough seizures despite medical compliance and no obvious changes in his health status.  1. Carbamazapine level is < 2. The patient and family are emphatic that he is very careful to take his Tegretol as directed and that he does not miss doses. Most likely he is metabolizing this medication rapidly. Will order an oral Carbamazepine load of 400 mg (chewable tablet) and increase his scheduled dose of Tegretol XR by 50% to 600 mg BID.   2. Continue Keppra at 1500 mg BID. 3. EEG is pending.  4. He has had no further seizures this admission.  5. Consider urine drug screen.  6. Per [facility] statutes, patients with seizures are not allowed to drive until  they have been seizure-free for six months. Use caution when using heavy equipment or power tools. Avoid working on ladders or at heights. Take showers instead of baths. Ensure the water temperature is not too high on the home water heater. Do not go swimming alone. When caring for infants or small children, sit down when holding, feeding, or changing them to minimize risk of injury to the child in the event you have a seizure. Also, Maintain good sleep hygiene. Avoid alcohol.  I have seen and examined the patient and discussed the assessment and plan with the patient and  his family.  Electronically signed: Dr. Caryl Pina

## 2017-05-08 NOTE — Progress Notes (Signed)
Patient got up to bathroom to void and had a few "drops" of bright red blood in urine. Majority of urine is clear yellow with no odor. Pt complains of no pain, burning, urgency or problems with starting urination. States he "thinks it was because pants were too tight and caused trauma to penis when pulling it out to pee".  VSS. Will continue to monitor and instructed pt to void in urinal during the night for inspection. Melton Alar, RN

## 2017-05-08 NOTE — Procedures (Signed)
Date of recording 05/08/2017  Referring physician E Lindzen  Technical Digital EEG recording using 10-20 international electrode system  Reason for the study 26 year old male presented with numerous seizures  Description of the recording Posterior dominant rhythm is 8-9 Hz symmetrical reactive and well sustained. Photic stimulation and hyperventilation did not produce any abnormal response. Non-REM stage II sleep was obtained vertex transients and sleep spindles were seen. No localizing, lateralizing, interictal epileptiform features were seen during this recording.  Impression  The EEG is normal with patient recorded in awake and sleep states.

## 2017-05-08 NOTE — Progress Notes (Signed)
EEG Completed; Results Pending  

## 2017-05-09 DIAGNOSIS — R569 Unspecified convulsions: Secondary | ICD-10-CM

## 2017-05-09 LAB — COMPREHENSIVE METABOLIC PANEL
ALK PHOS: 49 U/L (ref 38–126)
ALT: 33 U/L (ref 17–63)
ANION GAP: 3 — AB (ref 5–15)
AST: 36 U/L (ref 15–41)
Albumin: 3 g/dL — ABNORMAL LOW (ref 3.5–5.0)
BUN: 16 mg/dL (ref 6–20)
CALCIUM: 8.2 mg/dL — AB (ref 8.9–10.3)
CO2: 27 mmol/L (ref 22–32)
CREATININE: 1.09 mg/dL (ref 0.61–1.24)
Chloride: 110 mmol/L (ref 101–111)
Glucose, Bld: 87 mg/dL (ref 65–99)
Potassium: 3.7 mmol/L (ref 3.5–5.1)
SODIUM: 140 mmol/L (ref 135–145)
TOTAL PROTEIN: 5.7 g/dL — AB (ref 6.5–8.1)
Total Bilirubin: 0.6 mg/dL (ref 0.3–1.2)

## 2017-05-09 LAB — CBC
HCT: 37.7 % — ABNORMAL LOW (ref 39.0–52.0)
HEMOGLOBIN: 12.9 g/dL — AB (ref 13.0–17.0)
MCH: 31.5 pg (ref 26.0–34.0)
MCHC: 34.2 g/dL (ref 30.0–36.0)
MCV: 92 fL (ref 78.0–100.0)
PLATELETS: 191 10*3/uL (ref 150–400)
RBC: 4.1 MIL/uL — AB (ref 4.22–5.81)
RDW: 12.5 % (ref 11.5–15.5)
WBC: 6.1 10*3/uL (ref 4.0–10.5)

## 2017-05-09 MED ORDER — CARBAMAZEPINE ER 200 MG PO TB12: 600.0000 mg | ORAL_TABLET | Freq: Two times a day (BID) | ORAL | 0 refills | Status: DC

## 2017-05-09 NOTE — Discharge Summary (Signed)
Physician Discharge Summary  Ricky Humphrey:[id number] DOB: 1991-06-05 DOA: 05/08/2017  PCP: System, Pcp Not In  Admit date: 05/08/2017 Discharge date: 05/09/2017  Admitted From: Home Disposition:  Home  Recommendations for Outpatient Follow-up:  1. Follow up with Dr. Marjory Lies in 1 week 2. Consider repeat tegretol level after discharge, will defer to neurology   Discharge Condition: Stable CODE STATUS: Full  Diet recommendation: Regular   Brief/Interim Summary: From H&P by Dr. Elvera Lennox: Ricky Humphrey is a 26 y.o. 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 fib, mild cerebral palsy, who comes into the hospital brought by family for several seizure episodes at home.  Patient's girlfriend who is at bedside tells me that she has noticed to self-limiting seizure episode about 5 AM.  On arrival to the ED he had recurrent seizure, and the mother who is at bedside (she is a former Occupational psychologist) stated that he had another seizure.  His seizures usually start in the right upper extremity, is contracted, and progressed to be generalized tonic-clonic and this is what happened.    ED Course: In the ER patient was given Ativan which helped his seizure, he was loaded with Keppra and neurology was consulted.  They recommend admission and [facility] was called. Patient's vital signs are stable, and his blood work is essentially unremarkable.  Interim: Patient underwent a CT head, MRI brain, EEG. These are all unremarkable. Patient had low Tegretol levels despite compliance with his medication. Neurology increased his Tegretol dose, continued his regular Keppra dose. Patient remained seizure-free. He felt he was ready to be discharged home with close follow-up with his outpatient neurologist. He was advised on seizure precautions, refrain from driving for 6 months per [place] DMV, caution when operating heavy  machinery, etc. at time of discharge.   Discharge Diagnoses:  Active Problems:   History of atrial flutter   Seizure (HCC)   Seizure disorder -Patient was loaded with Keppra, continue Keppra for now at home dose -Neurology has been consulted -CT head, MRI brain, EEG as below -Dose of tegretol increased to  BID   History of A flutter -Had an episode of self-limiting a flutter in January 2013 -Has been evaluated by cardiology as an outpatient, was recommended a stress test however patient has declined    Discharge Instructions  Discharge Instructions    Call MD for:  difficulty breathing, headache or visual disturbances    Complete by:  As directed    Call MD for:  extreme fatigue    Complete by:  As directed    Call MD for:  hives    Complete by:  As directed    Call MD for:  persistant dizziness or light-headedness    Complete by:  As directed    Call MD for:  persistant nausea and vomiting    Complete by:  As directed    Call MD for:  severe uncontrolled pain    Complete by:  As directed    Call MD for:  temperature >100.4    Complete by:  As directed    Diet general    Complete by:  As directed    Discharge instructions    Complete by:  As directed    You were cared for by a hospitalist during your hospital stay. If you have any questions about your discharge medications or the care you received while you were in the hospital after  you are discharged, you can call the unit and asked to speak with the hospitalist on call if the hospitalist that took care of you is not available. Once you are discharged, your primary care physician will handle any further medical issues. Please note that NO REFILLS for any discharge medications will be authorized once you are discharged, as it is imperative that you return to your primary care physician (or establish a relationship with a primary care physician if you do not have one) for your aftercare needs so that they can reassess  your need for medications and monitor your lab values.   Discharge instructions    Complete by:  As directed    Per [facility] statutes, patients with seizures are not allowed to drive until they have been seizure-free for six months. Use caution when using heavy equipment or power tools. Avoid working on ladders or at heights. Take showers instead of baths. Ensure the water temperature is not too high on the home water heater. Do not go swimming alone. When caring for infants or small children, sit down when holding, feeding, or changing them to minimize risk of injury to the child in the event you have a seizure. Also, Maintain good sleep hygiene. Avoid alcohol.   Increase activity slowly    Complete by:  As directed      Allergies as of 05/09/2017   No Known Allergies     Medication List    STOP taking these medications   Azelastine HCl 0.15 % Soln   ibuprofen 800 MG tablet Commonly known as:  ADVIL,MOTRIN   ondansetron 4 MG tablet Commonly known as:  ZOFRAN     TAKE these medications   carbamazepine 200 MG 12 hr tablet Commonly known as:  TEGRETOL XR Take 3 tablets (600 mg total) by mouth 2 (two) times daily. What changed:  medication strength  how much to take   levETIRAcetam 750 MG tablet Commonly known as:  KEPPRA Take 2 tablets (1,500 mg total) by mouth 2 (two) times daily.            Discharge Care Instructions        Start     Ordered   05/09/17 0000  Increase activity slowly     05/09/17 0854   05/09/17 0000  Discharge instructions    Comments:  You were cared for by a hospitalist during your hospital stay. If you have any questions about your discharge medications or the care you received while you were in the hospital after you are discharged, you can call the unit and asked to speak with the hospitalist on call if the hospitalist that took care of you is not available. Once you are discharged, your primary care physician will handle any further  medical issues. Please note that NO REFILLS for any discharge medications will be authorized once you are discharged, as it is imperative that you return to your primary care physician (or establish a relationship with a primary care physician if you do not have one) for your aftercare needs so that they can reassess your need for medications and monitor your lab values.   05/09/17 0854   05/09/17 0000  Diet general     05/09/17 0854   05/09/17 0000  Call MD for:  extreme fatigue     05/09/17 0854   05/09/17 0000  Call MD for:  persistant dizziness or light-headedness     05/09/17 0854   05/09/17 0000  Call MD  for:  hives     05/09/17 0854   05/09/17 0000  Call MD for:  difficulty breathing, headache or visual disturbances     05/09/17 0854   05/09/17 0000  Call MD for:  severe uncontrolled pain     05/09/17 0854   05/09/17 0000  Call MD for:  persistant nausea and vomiting     05/09/17 0854   05/09/17 0000  Call MD for:  temperature >100.4     05/09/17 0854   05/09/17 0000  Discharge instructions    Comments:  Per [facility] statutes, patients with seizures are not allowed to drive until they have been seizure-free for six months. Use caution when using heavy equipment or power tools. Avoid working on ladders or at heights. Take showers instead of baths. Ensure the water temperature is not too high on the home water heater. Do not go swimming alone. When caring for infants or small children, sit down when holding, feeding, or changing them to minimize risk of injury to the child in the event you have a seizure. Also, Maintain good sleep hygiene. Avoid alcohol.   05/09/17 0855   05/09/17 0000  carbamazepine (TEGRETOL XR) 200 MG 12 hr tablet  2 times daily     05/09/17 1028     Follow-up Information    Penumalli, Glenford Bayley, MD. Schedule an appointment as soon as possible for a visit in 1 week(s).   Specialties:  Neurology, Radiology Contact information: [address] [place] [address] [phone]          No Known Allergies  Consultations:  Neurology    Procedures/Studies: Ct Head Wo Contrast  Result Date: 05/08/2017 CLINICAL DATA:  Recent seizures.  History of arachnoid cyst. EXAM: CT HEAD WITHOUT CONTRAST TECHNIQUE: Contiguous axial images were obtained from the base of the skull through the vertex without intravenous contrast. COMPARISON:  09/06/2015 and 07/08/2015 FINDINGS: Brain: Ventricles and cisterns are within normal. There is evidence of patient's known arachnoid cyst over the left parietal region without significant change. This has mild mass effect unchanged. No evidence of midline shift or acute hemorrhage. No evidence of acute infarction. Vascular: No hyperdense vessel or unexpected calcification. Skull:  Evidence of previous left parietal craniotomy. Sinuses/Orbits: No acute finding. Other: None. IMPRESSION: No acute intracranial findings. Stable prominent arachnoid cyst over the left parietal region. Previous left parietal craniotomy. Electronically Signed   By: Elberta Fortis M.D.   On: 05/08/2017 07:24   Mr Laqueta Jean BJ Contrast  Result Date: 05/08/2017 CLINICAL DATA:  Seizures today. History of craniotomy for arachnoid cyst drainage. EXAM: MRI HEAD WITHOUT AND WITH CONTRAST TECHNIQUE: Multiplanar, multiecho pulse sequences of the brain and surrounding structures were obtained without and with intravenous contrast. CONTRAST:  17mL MULTIHANCE GADOBENATE DIMEGLUMINE 529 MG/ML IV SOLN COMPARISON:  Head CT 05/08/2017 FINDINGS: Brain: There is no evidence of acute infarct, intracranial hemorrhage, midline shift, or extra-axial fluid collection. There is cystic encephalomalacia in the posterior left MCA territory primarily involving the parietal lobe with milder involvement of the posterior temporal lobe, insula, and posterior frontal lobe. There is ex vacuo enlargement of the atrium and occipital horn of the left lateral ventricle. The  dominant cystic region measures 7 x 3.5 cm and extends to the lateral margin of the atrium of the lateral ventricle with possible focal area of communication. Overlying left parietal craniotomy changes are noted. The brain is normal in signal elsewhere. The right lateral, third, and fourth ventricles  are normal in size. No abnormal enhancement is identified. The mesial temporal lobe structures are grossly symmetric and normal in appearance allowing for mild artifact on the dedicated coronal oblique sequence. Vascular: Major intracranial vascular flow voids are preserved. Skull and upper cervical spine: Left parietal craniotomy. No suspicious marrow lesion. Sinuses/Orbits: Unremarkable orbits. Mild mucosal thickening in the right frontal and right greater than left ethmoid sinuses. Clear mastoid air cells. Other: None. IMPRESSION: 1. No acute intracranial abnormality. 2. Moderately large region of cystic encephalomalacia in the posterior left cerebral hemisphere. Electronically Signed   By: Sebastian Ache M.D.   On: 05/08/2017 14:48    EEG Description of the recording Posterior dominant rhythm is 8-9 Hz symmetrical reactive and well sustained. Photic stimulation and hyperventilation did not produce any abnormal response. Non-REM stage II sleep was obtained vertex transients and sleep spindles were seen. No localizing, lateralizing, interictal epileptiform features were seen during this recording.  Impression   The EEG is normal with patient recorded in awake and sleep states.    Discharge Exam: Vitals:   05/08/17 2115 05/09/17 0551  BP: 129/70 128/68  Pulse: (!) 58 63  Resp: 18 18  Temp: 98.2 F (36.8 C) (!) 97.5 F (36.4 C)  SpO2: 99% 99%   Vitals:   05/08/17 1455 05/08/17 1912 05/08/17 2115 05/09/17 0551  BP: 122/86 135/86 129/70 128/68  Pulse: (!) 53 63 (!) 58 63  Resp: Temp: 97.9 F (36.6 C)  98.2 F (36.8 C) (!) 97.5 F (36.4 C)  TempSrc: Oral  Oral Oral  SpO2: 100%  100% 99% 99%  Weight:      Height:        General: Pt is alert, awake, not in acute distress Cardiovascular: RRR, S1/S2 +, no rubs, no gallops Respiratory: CTA bilaterally, no wheezing, no rhonchi Abdominal: Soft, NT, ND, bowel sounds + Extremities: no edema, no cyanosis Neuro: nonfocal, speech clear, alert and oriented     The results of significant diagnostics from this hospitalization (including imaging, microbiology, ancillary and laboratory) are listed below for reference.     Microbiology: No results found for this or any previous visit (from the past 240 hour(s)).   Labs: BNP (last 3 results) No results for input(s): BNP in the last 8760 hours. Basic Metabolic Panel:  Recent Labs Lab 05/08/17 0648 05/09/17 0443  NA 141 140  K 4.3 3.7  CL 110 110  CO2 25 27  GLUCOSE 92 87  BUN 18 16  CREATININE 1.23 1.09  CALCIUM 8.7* 8.2*  MG 2.0  --   PHOS 2.7  --    Liver Function Tests:  Recent Labs Lab 05/09/17 0443  AST 36  ALT 33  ALKPHOS 49  BILITOT 0.6  PROT 5.7*  ALBUMIN 3.0*   No results for input(s): LIPASE, AMYLASE in the last 168 hours. No results for input(s): AMMONIA in the last 168 hours. CBC:  Recent Labs Lab 05/08/17 0648 05/09/17 0443  WBC 6.3 6.1  NEUTROABS 4.2  --   HGB 13.6 12.9*  HCT 39.5 37.7*  MCV 93.2 92.0  PLT 226 191   Cardiac Enzymes: No results for input(s): CKTOTAL, CKMB, CKMBINDEX, TROPONINI in the last 168 hours. BNP: Invalid input(s): POCBNP CBG: No results for input(s): GLUCAP in the last 168 hours. D-Dimer No results for input(s): DDIMER in the last 72 hours. Hgb A1c No results for input(s): HGBA1C in the last 72 hours. Lipid Profile No results for input(s): CHOL, HDL,  LDLCALC, TRIG, CHOLHDL, LDLDIRECT in the last 72 hours. Thyroid function studies No results for input(s): TSH, T4TOTAL, T3FREE, THYROIDAB in the last 72 hours.  Invalid input(s): FREET3 Anemia work up No results for input(s): VITAMINB12,  FOLATE, FERRITIN, TIBC, IRON, RETICCTPCT in the last 72 hours. Urinalysis    Component Value Date/Time   COLORURINE YELLOW 06/15/2013 1216   APPEARANCEUR CLEAR 06/15/2013 1216   LABSPEC 1.020 06/15/2013 1216   PHURINE 5.0 06/15/2013 1216   GLUCOSEU NEGATIVE 06/15/2013 1216   HGBUR MODERATE (A) 06/15/2013 1216   BILIRUBINUR NEGATIVE 06/15/2013 1216   KETONESUR NEGATIVE 06/15/2013 1216   PROTEINUR 30 (A) 06/15/2013 1216   UROBILINOGEN 0.2 06/15/2013 1216   NITRITE NEGATIVE 06/15/2013 1216   LEUKOCYTESUR NEGATIVE 06/15/2013 1216   Sepsis Labs Invalid input(s): PROCALCITONIN,  WBC,  LACTICIDVEN Microbiology No results found for this or any previous visit (from the past 240 hour(s)).   Time coordinating discharge: 40 minutes  SIGNED:  Noralee Stain, DO [facility] Pager [phone]  If 7PM-7AM, please contact night-coverage [email or web address] Password [email or web address] 05/09/2017, 11:08 AM

## 2017-05-11 MED FILL — CARBAMAZEPINE XR 200 MG TAB: 200 | 30 days supply | Qty: 180 | Fill #0

## 2017-05-12 ENCOUNTER — Telehealth: Payer: Self-pay | Admitting: Diagnostic Neuroimaging

## 2017-05-12 NOTE — Telephone Encounter (Signed)
Pt's mother called said he had a seizure 05/08/17, went to [facility] ED. She said the seizure was due to high metabolism and the dosing on tegretol was adjusted. [place] they were advised his license would be taken for 6 mths but she said he is the only one in the household that drives and wants to know if there are any provisions for this, like to and from work. Please call to advise

## 2017-05-12 NOTE — Telephone Encounter (Signed)
Agree. -VRP 

## 2017-05-12 NOTE — Telephone Encounter (Signed)
I spoke with mother of pt.  I told her that there were no provisions for pts that have had a sz (per [place] law he cannot drive for 6 months from last sz).  She verbalized understanding.  Pt is taking tegretol  po bid now and keppra  po BID.

## 2017-05-14 LAB — CARBAMAZEPINE, FREE AND TOTAL
CARBAMAZEPINE FREE: 0.6 ug/mL (ref 0.6–4.2)
CARBAMAZEPINE, TOTAL: 1.9 ug/mL — AB (ref 4.0–12.0)

## 2017-05-20 MED FILL — levETIRAcetam 750 MG TABS: 750 | 30 days supply | Qty: 120 | Fill #8

## 2017-05-22 ENCOUNTER — Telehealth: Payer: Self-pay | Admitting: Diagnostic Neuroimaging

## 2017-05-22 ENCOUNTER — Encounter: Payer: Self-pay | Admitting: Diagnostic Neuroimaging

## 2017-05-22 ENCOUNTER — Ambulatory Visit (INDEPENDENT_AMBULATORY_CARE_PROVIDER_SITE_OTHER): Payer: Medicaid Other | Admitting: Diagnostic Neuroimaging

## 2017-05-22 VITALS — BP 123/77 | HR 69 | Ht 75.0 in | Wt 190.2 lb

## 2017-05-22 DIAGNOSIS — G40109 Localization-related (focal) (partial) symptomatic epilepsy and epileptic syndromes with simple partial seizures, not intractable, without status epilepticus: Secondary | ICD-10-CM

## 2017-05-22 MED ORDER — LEVETIRACETAM 750 MG PO TABS: 1500.0000 mg | ORAL_TABLET | Freq: Two times a day (BID) | ORAL | 4 refills | Status: DC

## 2017-05-22 MED ORDER — CARBAMAZEPINE ER 200 MG PO TB12: 600.0000 mg | ORAL_TABLET | Freq: Two times a day (BID) | ORAL | 4 refills | Status: DC

## 2017-05-22 NOTE — Progress Notes (Signed)
[facility]  PATIENT: Ricky Humphrey DOB: [birth year]  REFERRING CLINICIAN:  HISTORY FROM: patient REASON FOR VISIT: follow up   HISTORICAL  CHIEF COMPLAINT:  Chief Complaint  Patient presents with  . Follow-up  . Seizures    none.  doing well.    HISTORY OF PRESENT ILLNESS:   UPDATE (05/08/17, VRP): Since last visit, was doing well until 05/08/17 --> had breakthrough seizure at home. Felt some warning when he woke up at 5:30am then had witnessed generalized convulsive seizure with tongue biting; no incontinence. Woke up in the ambulance. Only factor may have been increased stress related to a recent job promotion. Had low CBZ level in ER which was inadjusted with higher dosing. No missed doses of medications. Tolerating new dosing. No alleviating or aggravating factors.   UPDATE 06/13/16: Since last visit, HA are daily. Taking daily ibuprofen. Having interrupted sleep and excessive daytime fatigue. Drinks 1-2 cups coffee in AM, and 1 soda in evening. No seizures.  UPDATE 10/16/15: Since last visit, no seizures. Tolerating meds. Good compliance. Unfortunately, he was at work, bent down to reach for a package, and then struck his head on extender platform from the truck. He had severe pain, but no LOC. This happened 09/06/15. Went to ER, CT head showed no acute findings. Arachnoid cyst slightly larger than 2013, but no major findings.  UPDATE 04/23/15: Since last visit no sz. Last sz Oct 2014. Tolerating meds without issues. Very good compliance with medications.  UPDATE 07/17/14: Since last visit, doing well and no seizures. Asking about possibility of applying for driver's license. Tolerating medications. No new events of issues.   UPDATE 06/20/13: Since last visit, patient was doing well until breakthrough seizure on 06/15/13. Apparently his pharmacy changed supplier of Carbatrol to a different manufacturer and did not give patient full refill of  medication. Patient ended up missing 3 successive doses of Carbatrol and then had multiple back-to-back seizures without return of consciousness. Patient was admitted to the hospital, monitored in the intensive care unit, discharged one day later. Since that time patient has returned to baseline. He still feels a little but sore, especially his tongue. Patient acknowledges missing intermittent doses of medication over the past one year, but usually without any consequence. Patient continues to have problems with insomnia and shifted sleep cycle. He typically goes to sleep at 3 AM and wakes up at noon or 2 PM. He drinks alcohol on social basis.  UPDATE 03/02/12: Doing about the same. No new seizures. Tolerating meds. Asking about driving.   UPDATE 11/19/11: On 09/23/11, pt admitted for breakthrough sz. He had run out of carbatrol, and level was undetectable when checked. Also dx'd with aflutter and has followed up with Dr. Jacinto Halim. No further events.  PRIOR HPI: 26 year old left-handed male with history of mild cerebral palsy, here for evaluation of seizure disorder. Patient is product of full-term gestation, 18 hour labor, attempted vaginal delivery with foreceps, with emergent c-section.  Patient developed normally from cognitive standpoint. He had mild right-sided incoordination.  Neuroimaging study demonstrated left brain cyst, and patient was diagnosed with cerebral palsy. Patient had no further problems until age 26 years old, when he was 26 years old, when he had a new onset generalized convulsive seizure with tongue biting and incontinence at school. He went to the emergency room and a second seizure in the CT scan. He started on Keppra.  He continued to have seizures once per week.  Due to  the presence of left brain cyst, he underwent left brain craniotomy, with unsuccessful cyst resection.  Then Carbatrol was added to his regimen. His seizure frequency declined to once every 6-9 months. He had  breakthrough seizure in April 2012 with concomitant strep infection. Most recent breakthrough seizure was 2 days ago on 07/27/11. Nowadays typical seizures consist of right arm posturing and convulsions, followed by loss of consciousness.   REVIEW OF SYSTEMS: Full 14 system review of systems performed and negative except: back pain.   ALLERGIES: No Known Allergies  HOME MEDICATIONS: Outpatient Medications Prior to Visit  Medication Sig Dispense Refill  . carbamazepine (TEGRETOL XR) 200 MG 12 hr tablet Take 3 tablets (600 mg total) by mouth 2 (two) times daily. 180 tablet 0  . levETIRAcetam (KEPPRA) 750 MG tablet Take 2 tablets (1,500 mg total) by mouth 2 (two) times daily. 360 tablet 4   No facility-administered medications prior to visit.     PAST MEDICAL HISTORY: Past Medical History:  Diagnosis Date  . Arachnoid cyst    s/p drainage by craniotomy  . Cerebral palsy (HCC)    mild  . Epilepsy (HCC)   . Headache   . Paroxysmal atrial flutter (HCC)    brief, post sz on 09/22/11  . Seizures (HCC)     PAST SURGICAL HISTORY: Past Surgical History:  Procedure Laterality Date  . acrnoid cyst    . TONSILLECTOMY AND ADENOIDECTOMY      FAMILY HISTORY: Family History  Problem Relation Age of Onset  . Migraines Mother   . Diabetes Mother     SOCIAL HISTORY:  Social History   Social History  . Marital status: Single    Spouse name: girlfriend  . Number of children: 0  . Years of education: College   Occupational History  . Loader     [employer]   Social History Main Topics  . Smoking status: Never Smoker  . Smokeless tobacco: Never Used  . Alcohol use No  . Drug use: No  . Sexual activity: Not on file   Other Topics Concern  . Not on file   Social History Narrative   Patient lives with his girlfriend.   College at [employer].    Caffeine Use: 2-3 cups daily     PHYSICAL EXAM  Vitals:   05/22/17 0750  BP: 123/77  Pulse: 69  Weight: 190 lb 3.2 oz (86.3 kg)    Height:  (1.905 m)    Not recorded      Body mass index is 23.77 kg/m.  GENERAL EXAM: Patient is in no distress  CARDIOVASCULAR: Regular rate and rhythm, no murmurs, no carotid bruits  NEUROLOGIC: MENTAL STATUS: awake, alert, language fluent, comprehension intact, naming intact CRANIAL NERVE: pupils equal and reactive to light, visual fields full to confrontation, extraocular muscles intact, no nystagmus, facial sensation and strength symmetric, uvula midline, shoulder shrug symmetric, tongue midline. MOTOR: normal bulk and tone, full strength in the BUE, BLE SENSORY: normal and symmetric to light touch, pinprick, temperature, vibration COORDINATION: finger-nose-finger, fine finger movements normal; SLOW TAPPING IN RUE AND RLE.  REFLEXES: deep tendon reflexes present and symmetric GAIT/STATION: narrow based gait; romberg is negative    DIAGNOSTIC DATA (LABS, IMAGING, TESTING) - I reviewed patient records, labs, notes, testing and imaging myself where available.  Lab Results  Component Value Date   WBC 6.1 05/09/2017   HGB 12.9 (L) 05/09/2017   HCT 37.7 (L) 05/09/2017   MCV 92.0 05/09/2017   PLT 191  05/09/2017      Component Value Date/Time   NA 140 05/09/2017 0443   NA 142 06/13/2016 1212   K 3.7 05/09/2017 0443   CL 110 05/09/2017 0443   CO2 27 05/09/2017 0443   GLUCOSE 87 05/09/2017 0443   BUN 16 05/09/2017 0443   BUN 13 06/13/2016 1212   CREATININE 1.09 05/09/2017 0443   CALCIUM 8.2 (L) 05/09/2017 0443   PROT 5.7 (L) 05/09/2017 0443   PROT 6.6 06/13/2016 1212   ALBUMIN 3.0 (L) 05/09/2017 0443   ALBUMIN 3.9 06/13/2016 1212   AST 36 05/09/2017 0443   ALT 33 05/09/2017 0443   ALKPHOS 49 05/09/2017 0443   BILITOT 0.6 05/09/2017 0443   BILITOT 0.5 06/13/2016 1212   GFRNONAA >60 05/09/2017 0443   GFRAA >60 05/09/2017 0443   No results found for: CHOL Lab Results  Component Value Date   HGBA1C 4.8 06/14/2016   No results found for: [id number] Lab  Results  Component Value Date   TSH 0.618 06/13/2016   Lab Results  Component Value Date   CBMZ 1.9 (L) 05/08/2017   09/22/11 CT head  - Prior left temporal craniotomy. Large CSF attenuation collection extra-axial in left parietal region, consistent with arachnoid cyst. Few linear areas of slightly higher attenuation are seen within the anterior aspect of this collection, question septations, uncertain etiology; consider either comparison to prior outside exams to assess stability or further evaluation by MR imaging with and without contrast to assess.  09/06/15 CT head  - No acute intracranial hemorrhage. Left parietal arachnoid cyst grossly stable or minimally increased compared to the prior study.    ASSESSMENT AND PLAN  26 y.o.  left-handed male with mild cerebral palsy and complex partial seizures with secondary generalization.  Patient was having breakthrough seizures every 6-9 months. Was sz free since Oct 2014 with better dosing of anti-seizure medications, until Sept 2018.   Also with post-traumatic headaches since Sep 06, 2015; CT head and neuro exam unremarkable. Advised conservative mgmt for now.  Last seizure 05/12/17.    Dx:  Localization-related epilepsy [facility])    PLAN:  SEIZURE DISRORDER (established problem, worsening)  - continue levetiracetam  twice a day   - continue carbamazepine XR  twice a day   - According to [place] law, you can not drive unless you are seizure / syncope free for at least 6 months and under physician's care.   - Please maintain precautions. Do not participate in activities where a los s of awareness could harm you or someone else. No swimming alone, no tub bathing, no hot tubs, no driving, no operating motorized vehicles (cars, ATVs, motocycles, etc), lawnmowers, power tools or firearms. No standing at heights, such as rooftops, ladders or stairs. Avoid hot objects such as stoves, heaters, open fires. Wear a helmet when riding a  bicycle, scooter, skateboard, etc. and avoid areas of traffic. Set your water heater to 120 degrees or less.  Meds ordered this encounter  Medications  . levETIRAcetam (KEPPRA) 750 MG tablet    Sig: Take 2 tablets (1,500 mg total) by mouth 2 (two) times daily.    Dispense:  360 tablet    Refill:  4  . carbamazepine (TEGRETOL XR) 200 MG 12 hr tablet    Sig: Take 3 tablets (600 mg total) by mouth 2 (two) times daily.    Dispense:  540 tablet    Refill:  4   Return in about 6 months (around 11/19/2017).    VIKRAM  R. PENUMALLI, MD 05/22/2017, 8:18 AM Certified in Neurology, Neurophysiology and Neuroimaging  [facility] [address] [place], [place] [address] [phone]

## 2017-05-22 NOTE — Patient Instructions (Signed)
SEIZURE DISRORDER (established problem, worsening)  - continue levetiracetam  twice a day   - continue carbamazepine XR  twice a day   - According to [place] law, you can not drive unless you are seizure / syncope free for at least 6 months and under physician's care.   - Please maintain precautions. Do not participate in activities where a los s of awareness could harm you or someone else. No swimming alone, no tub bathing, no hot tubs, no driving, no operating motorized vehicles (cars, ATVs, motocycles, etc), lawnmowers, power tools or firearms. No standing at heights, such as rooftops, ladders or stairs. Avoid hot objects such as stoves, heaters, open fires. Wear a helmet when riding a bicycle, scooter, skateboard, etc. and avoid areas of traffic. Set your water heater to 120 degrees or less.

## 2017-05-22 NOTE — Telephone Encounter (Signed)
Patient called office in reference to paperwork for a seizure watch, patient advised that we please contact his mother Ted Mcalpine (listed on [facility] Contact) once form is completed for her to pick up.  FYI

## 2017-05-25 NOTE — Telephone Encounter (Signed)
Form signed and given to debra in MR.  JOSH PROVIDES.  (for [employer] epilepsy smartwatch).

## 2017-06-15 NOTE — Telephone Encounter (Addendum)
Mother brought Ricky Humphrey Provides form back. Needed another signature from Dr Marjory LiesPenumalli. Form signed; mother notified by phone. Advised form is at front desk for pick up. She verbalized understanding, appreciation.

## 2017-06-18 ENCOUNTER — Telehealth: Payer: Self-pay

## 2017-06-18 MED FILL — CARBAMAZEPINE XR 200 MG TAB: 200 | 30 days supply | Qty: 180 | Fill #0

## 2017-06-18 MED FILL — levETIRAcetam 750 MG TABS: 750 | 30 days supply | Qty: 120 | Fill #0

## 2017-06-18 NOTE — Telephone Encounter (Signed)
Patient's mother called stating that the patient was recently in the hospital and his Tegretol XR was increased. His pharmacy is stating that it now requires a prior authorization. I confirmed pharmacy is [facility] outpatient. Thanks.

## 2017-06-18 NOTE — Telephone Encounter (Signed)
Called [place] [facility] to begin PA for tegretol XR, spoke with Narmin. Completed PA, carbamazepine XR authorized for 06/18/17-06/13/2018, PA #[id number], ref # I [id number]. [facility] for [facility]   Patient's insurance: [place] Medicaid [id number] BIN: [id number] PCN: [id number] Card Fran LowesHolder: [id number] P  Called patient 's mother and advised her of approval. Requested that she have patient bring in new insurance card at his next visit in this office. She verbalized understanding, appreciation of call.

## 2017-07-03 ENCOUNTER — Other Ambulatory Visit: Payer: Self-pay

## 2017-07-03 ENCOUNTER — Encounter: Payer: Self-pay | Admitting: Physician Assistant

## 2017-07-03 ENCOUNTER — Ambulatory Visit: Payer: BLUE CROSS/BLUE SHIELD | Admitting: Physician Assistant

## 2017-07-03 VITALS — BP 110/60 | HR 82 | Temp 98.7°F | Resp 18 | Ht 75.0 in | Wt 195.6 lb

## 2017-07-03 DIAGNOSIS — L0231 Cutaneous abscess of buttock: Secondary | ICD-10-CM

## 2017-07-03 MED ORDER — DOXYCYCLINE HYCLATE 100 MG PO CAPS: 100.0000 mg | ORAL_CAPSULE | Freq: Two times a day (BID) | ORAL | 0 refills | Status: AC

## 2017-07-03 MED FILL — DOXYCYCLINE HYCLATE 100 MG: 100 | 10 days supply | Qty: 20 | Fill #0

## 2017-07-03 NOTE — Patient Instructions (Addendum)
Take the antibiotic as prescribed. Apply a warm compress to the area for 15-20 minutes 2-4 times each day. Change the dressing if it becomes saturated, leaks or falls off.     IF you received an x-ray today, you will receive an invoice from [facility]. Please contact [facility] at [phone] with questions or concerns regarding your invoice.   IF you received labwork today, you will receive an invoice from [facility]. Please contact [facility] at [phone] with questions or concerns regarding your invoice.   Our billing staff will not be able to assist you with questions regarding bills from these companies.  You will be contacted with the lab results as soon as they are available. The fastest way to get your results is to activate your My Chart account. Instructions are located on the last page of this paperwork. If you have not heard from us regarding the results in 2 weeks, please contact this office.     

## 2017-07-03 NOTE — Progress Notes (Signed)
     Patient ID: Ricky Humphrey, male    DOB: [birth year], 26 y.o.   MRN: [id number]  PCP: System, Pcp Not In  Chief Complaint  Patient presents with  . Abcess    x1 week, left glut, imflammed, red and hard. Pt states he ran a fever last week on and off.    Subjective:   Presents for evaluation of a tender lump on the LEFT buttock.  He is accompanied by his mother.  Noticed pain when he sat down after a shift at work. Initially he had intermittent fevers, none x 1 week. No drainage. Pain is worsening. Has had similar lesions in the past, one on the LEFT hip, one on the face, last was about a year ago. Had to be I&D'd in the ED.   Review of Systems As above.    Patient Active Problem List   Diagnosis Date Noted  . Seizure (HCC) 05/08/2017  . Cerebral palsy (HCC) 06/14/2016  . 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 Convulsions/seizures (HCC) 06/15/2013  . History of atrial flutter 09/23/2011     Prior to Admission medications   Medication Sig Start Date End Date Taking? Authorizing Provider  carbamazepine (TEGRETOL XR) 200 MG 12 hr tablet Take 3 tablets (600 mg total) by mouth 2 (two) times daily. 05/22/17  Yes Penumalli, Glenford BayleyVikram R, MD  levETIRAcetam (KEPPRA) 750 MG tablet Take 2 tablets (1,500 mg total) by mouth 2 (two) times daily. 05/22/17  Yes Penumalli, Glenford BayleyVikram R, MD     No Known Allergies     Objective:  Physical Exam  Constitutional: He is oriented to person, place, and time. He appears well-developed and well-nourished. He is active and cooperative. No distress.  BP 110/60 (BP Location: Left Arm, Patient Position: Sitting, Cuff Size: Large)   Pulse 82   Temp 98.7 F (37.1 C) (Oral)   Resp 18   Ht 6\' 3"  (1.905 m)   Wt 195 lb 9.6 oz (88.7 kg)   SpO2 98%   BMI 24.45 kg/m    Eyes: Conjunctivae are normal.  Pulmonary/Chest: Effort normal.  Neurological: He is alert and oriented to person, place, and time.  Skin: Skin is warm and dry. Lesion  noted.     Psychiatric: He has a normal mood and affect. His speech is normal and behavior is normal.   PROCEDURE: Verbal Consent Obtained. Local anesthesia with 2 cc of 2% lidocaine plain.  11 blade used to incise the lesion centrally.  Purulence expressed. Packed with 1/2 inch plain packing.  Cleansed and dressed.         Assessment & Plan:   1. Abscess of left buttock Warm compresses. NSAIDS. Doxycycline.  - doxycycline (VIBRAMYCIN) 100 MG capsule; Take 1 capsule (100 mg total) 2 (two) times daily for 10 days by mouth.  Dispense: 20 capsule; Refill: 0 - WOUND CULTURE    Return in about 1 day (around 07/04/2017) for wound care.   Fernande Brashelle S. Hansini Clodfelter, PA-C Primary Care at [facility]

## 2017-07-04 ENCOUNTER — Ambulatory Visit (INDEPENDENT_AMBULATORY_CARE_PROVIDER_SITE_OTHER): Payer: BLUE CROSS/BLUE SHIELD | Admitting: Physician Assistant

## 2017-07-04 ENCOUNTER — Encounter: Payer: Self-pay | Admitting: Physician Assistant

## 2017-07-04 VITALS — BP 123/72 | HR 66 | Temp 98.7°F | Resp 16 | Ht 75.0 in | Wt 195.2 lb

## 2017-07-04 DIAGNOSIS — L0231 Cutaneous abscess of buttock: Secondary | ICD-10-CM | POA: Diagnosis not present

## 2017-07-04 NOTE — Patient Instructions (Addendum)
Continue the antibiotic as prescribed. Apply a warm compress to the area for 15-20 minutes 2-4 times each day. Change the dressing if it becomes saturated, leaks or falls off.  On Monday 07/06/2017, remove the dressing and pull the packing out. You'll need a Bandaid for a few days, until the wound closes.  If the pain begins to worsen again, return for re-evaluation.    IF you received an x-ray today, you will receive an invoice from [facility]. Please contact [facility] at [phone] with questions or concerns regarding your invoice.   IF you received labwork today, you will receive an invoice from [facility]. Please contact [facility] at [phone] with questions or concerns regarding your invoice.   Our billing staff will not be able to assist you with questions regarding bills from these companies.  You will be contacted with the lab results as soon as they are available. The fastest way to get your results is to activate your My Chart account. Instructions are located on the last page of this paperwork. If you have not heard from us regarding the results in 2 weeks, please contact this office.

## 2017-07-04 NOTE — Progress Notes (Signed)
     Chief Complaint  Patient presents with  . Follow-up    recheck abscess    History of Present Illness: Patient presents for re-evaluation of buttock cellulitis/abscess following I&D yesterday. He is accompanied by his girlfriend.  He is tolerating the doxycycline without difficulty. Reports persistent, but significantly improved pain since I&D yesterday.  No Known Allergies  Prior to Admission medications   Medication Sig Start Date End Date Taking? Authorizing Provider  carbamazepine (TEGRETOL XR) 200 MG 12 hr tablet Take 3 tablets (600 mg total) by mouth 2 (two) times daily. 05/22/17   Penumalli, Glenford BayleyVikram R, MD  doxycycline (VIBRAMYCIN) 100 MG capsule Take 1 capsule (100 mg total) 2 (two) times daily for 10 days by mouth. 07/03/17 07/13/17  Porfirio OarJeffery, Kobi Aller, PA-C  levETIRAcetam (KEPPRA) 750 MG tablet Take 2 tablets (1,500 mg total) by mouth 2 (two) times daily. 05/22/17   Penumalli, Glenford BayleyVikram R, MD    Patient Active Problem List   Diagnosis Date Noted  . Seizure (HCC) 05/08/2017  . Cerebral palsy (HCC) 06/14/2016  . 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 Convulsions/seizures (HCC) 06/15/2013  . History of atrial flutter 09/23/2011     Physical Exam  Constitutional: He is oriented to person, place, and time. He appears well-developed and well-nourished. He is active and cooperative. No distress.  BP 123/72   Pulse 66   Temp 98.7 F (37.1 C)   Resp 16   Ht 6\' 3"  (1.905 m)   Wt 195 lb 3.2 oz (88.5 kg)   SpO2 96%   BMI 24.40 kg/m    Eyes: Conjunctivae are normal.  Pulmonary/Chest: Effort normal.  Genitourinary:     Neurological: He is alert and oriented to person, place, and time.  Psychiatric: He has a normal mood and affect. His speech is normal and behavior is normal.      ASSESSMENT & PLAN:  1. Abscess of left buttock Culture is still pending. Complete doxycycline. Continue warm compresses. Remove packin in 48 hours and allow wound to heal.      Return if symptoms worsen or fail to improve.   Fernande Brashelle S. Marrian Bells, PA-C Primary Care at [facility]

## 2017-07-07 LAB — WOUND CULTURE: ORGANISM ID, BACTERIA: NONE SEEN

## 2017-07-08 ENCOUNTER — Encounter: Payer: Self-pay | Admitting: Diagnostic Neuroimaging

## 2017-07-14 ENCOUNTER — Emergency Department (HOSPITAL_COMMUNITY)
Admission: EM | Admit: 2017-07-14 | Discharge: 2017-07-14 | Disposition: A | Discharge status: Home / Self Care | Payer: BLUE CROSS/BLUE SHIELD | Attending: Emergency Medicine | Admitting: Emergency Medicine

## 2017-07-14 ENCOUNTER — Encounter (HOSPITAL_COMMUNITY): Payer: Self-pay | Admitting: Emergency Medicine

## 2017-07-14 ENCOUNTER — Other Ambulatory Visit: Payer: Self-pay

## 2017-07-14 DIAGNOSIS — G40909 Epilepsy, unspecified, not intractable, without status epilepticus: Secondary | ICD-10-CM | POA: Diagnosis not present

## 2017-07-14 DIAGNOSIS — Z79899 Other long term (current) drug therapy: Secondary | ICD-10-CM | POA: Insufficient documentation

## 2017-07-14 DIAGNOSIS — R569 Unspecified convulsions: Secondary | ICD-10-CM

## 2017-07-14 LAB — BASIC METABOLIC PANEL
ANION GAP: 13 (ref 5–15)
BUN: 14 mg/dL (ref 6–20)
CALCIUM: 8.8 mg/dL — AB (ref 8.9–10.3)
CO2: 19 mmol/L — ABNORMAL LOW (ref 22–32)
CREATININE: 1.27 mg/dL — AB (ref 0.61–1.24)
Chloride: 105 mmol/L (ref 101–111)
Glucose, Bld: 73 mg/dL (ref 65–99)
Potassium: 4 mmol/L (ref 3.5–5.1)
SODIUM: 137 mmol/L (ref 135–145)

## 2017-07-14 LAB — CBC
HCT: 40.9 % (ref 39.0–52.0)
Hemoglobin: 14.1 g/dL (ref 13.0–17.0)
MCH: 32.5 pg (ref 26.0–34.0)
MCHC: 34.5 g/dL (ref 30.0–36.0)
MCV: 94.2 fL (ref 78.0–100.0)
PLATELETS: 239 10*3/uL (ref 150–400)
RBC: 4.34 MIL/uL (ref 4.22–5.81)
RDW: 12.6 % (ref 11.5–15.5)
WBC: 10.2 10*3/uL (ref 4.0–10.5)

## 2017-07-14 LAB — PHENYTOIN LEVEL, TOTAL

## 2017-07-14 LAB — CARBAMAZEPINE LEVEL, TOTAL: CARBAMAZEPINE LVL: 3.1 ug/mL — AB (ref 4.0–12.0)

## 2017-07-14 LAB — VALPROIC ACID LEVEL

## 2017-07-14 NOTE — ED Notes (Signed)
Bed: WA20 Expected date:  Expected time:  Means of arrival:  Comments: 26yo M/Seizure

## 2017-07-14 NOTE — ED Notes (Signed)
ED Provider at bedside. 

## 2017-07-14 NOTE — ED Triage Notes (Signed)
Per EMS , pt. From home with complaint of seizure at around 0520 this morning , unwitnessed , pt. Reported to be in his bed and was wearing his "watch alert" for seizures activities , went off and  sent messages to roommates and family upon episode, reported lasted a minute. Post ictal reported but alert and oriented x4 upon arrival to ED. Pt. Reported of lac on his tongue ,bleeding controlled. No s/s of distress upon arrival to ED.

## 2017-07-14 NOTE — ED Provider Notes (Signed)
[facility]-EMERGENCY DEPT Provider Note   CSN: [id number] Arrival date & time: 07/14/17  0609     History   Chief Complaint Chief Complaint  Patient presents with  . Seizures    HPI Ricky Humphrey is a 26 y.o. male past medical history of paroxysmal A. fib R, cerebral palsy, epilepsy, presenting to the ED after a seizure that occurred around 05 20 this morning.  Patient's mother states they have a monitoring system that alert some if he is seizing off this morning.  He states he lives with a roommate, who came in at the tail end of the seizure.  He denies headache, vision changes, or any other injuries other than biting the side of his tongue on the right side.  No known head trauma.  States he is currently still feeling as if he is in his postictal state, with fatigue and sleepiness.  He currently takes Tegretol and Keppra for his seizures, which she has been taking as prescribed with his last doses yesterday evening.  Reports last seizure was about 10 weeks ago.  Since that time his neurologist, Dr. Marjory LiesPenumalli, increased his Tegretol dose.  Denies any other injuries today.  States he normally feels drowsy for the remainder of the day following a seizure. His mother also reports recent antibiotic use for an abscess.   The history is provided by the patient and a parent.    Past Medical History:  Diagnosis Date  . Arachnoid cyst    s/p drainage by craniotomy  . Cerebral palsy (HCC)    mild  . Epilepsy (HCC)   . Headache   . Paroxysmal atrial flutter (HCC)    brief, post sz on 09/22/11  . Seizures [facility](HCC)     Patient Active Problem List   Diagnosis Date Noted  . Seizure (HCC) 05/08/2017  . Cerebral palsy (HCC) 06/14/2016  . 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 Convulsions/seizures (HCC) 06/15/2013  . History of atrial flutter 09/23/2011    Past Surgical History:  Procedure Laterality Date  . acrnoid cyst    . TONSILLECTOMY AND  ADENOIDECTOMY         Home Medications    Prior to Admission medications   Medication Sig Start Date End Date Taking? Authorizing Provider  carbamazepine (TEGRETOL XR) 200 MG 12 hr tablet Take 3 tablets (600 mg total) by mouth 2 (two) times daily. 05/22/17  Yes Penumalli, Glenford BayleyVikram R, MD  levETIRAcetam (KEPPRA) 750 MG tablet Take 2 tablets (1,500 mg total) by mouth 2 (two) times daily. 05/22/17  Yes Penumalli, Glenford BayleyVikram R, MD    Family History Family History  Problem Relation Age of Onset  . Migraines Mother   . Diabetes Mother   . Asthma Brother     Social History Social History   Tobacco Use  . Smoking status: Never Smoker  . Smokeless tobacco: Never Used  Substance Use Topics  . Alcohol use: No    Alcohol/week: 0.0 oz  . Drug use: No     Allergies   Patient has no known allergies.   Review of Systems Review of Systems  HENT:       Cut to tongue  Cardiovascular: Negative for chest pain.  Gastrointestinal: Negative for abdominal pain and nausea.  Neurological: Positive for seizures. Negative for speech difficulty, weakness, numbness and headaches.  All other systems reviewed and are negative.    Physical Exam Updated Vital Signs BP 120/61   Pulse 64   Temp 98.1 F (  36.7 C)   Resp 15   SpO2 100%   Physical Exam  Constitutional: He appears well-developed and well-nourished. No distress.  HENT:  Head: Normocephalic and atraumatic.  Small laceration to lateral aspect of right side of tongue. Not actively bleeding.   Eyes: Conjunctivae and EOM are normal. Pupils are equal, round, and reactive to light.  Neck: Normal range of motion. Neck supple.  Cardiovascular: Normal rate, regular rhythm, normal heart sounds and intact distal pulses.  Pulmonary/Chest: Effort normal and breath sounds normal. No respiratory distress.  Abdominal: Soft. Bowel sounds are normal. He exhibits no distension. There is no tenderness.  Musculoskeletal: Normal range of motion. He  exhibits no tenderness.  Neurological: He is alert.  Mental Status:  Alert, oriented, thought content appropriate, able to give a coherent history. Speech fluent without evidence of aphasia. Able to follow 2 step commands without difficulty.  Cranial Nerves:  II:  Peripheral visual fields grossly normal, pupils equal, round, reactive to light III,IV, VI: ptosis not present, extra-ocular motions intact bilaterally  V,VII: smile symmetric, facial light touch sensation equal VIII: hearing grossly normal to voice  X: uvula elevates symmetrically  XI: bilateral shoulder shrug symmetric and strong XII: midline tongue extension without fassiculations Motor:  Normal tone. 5/5 in upper and lower extremities bilaterally including strong and equal grip strength and dorsiflexion/plantar flexion Sensory: Pinprick and light touch normal in all extremities.  Deep Tendon Reflexes: 2+ and symmetric in the biceps and patella Cerebellar: normal finger-to-nose with bilateral upper extremities Gait: normal gait and balance CV: distal pulses palpable throughout    Skin: Skin is warm.  Psychiatric: He has a normal mood and affect. His behavior is normal.  Nursing note and vitals reviewed.    ED Treatments / Results  Labs (all labs ordered are listed, but only abnormal results are displayed) Labs Reviewed  BASIC METABOLIC PANEL - Abnormal; Notable for the following components:      Result Value   CO2 19 (*)    Creatinine, Ser 1.27 (*)    Calcium 8.8 (*)    All other components within normal limits  VALPROIC ACID LEVEL - Abnormal; Notable for the following components:   Valproic Acid Lvl <10 (*)    All other components within normal limits  PHENYTOIN LEVEL, TOTAL - Abnormal; Notable for the following components:   Phenytoin Lvl <2.5 (*)    All other components within normal limits  CARBAMAZEPINE LEVEL, TOTAL - Abnormal; Notable for the following components:   Carbamazepine Lvl 3.1 (*)    All other  components within normal limits  CBC    EKG  EKG Interpretation  Date/Time:  Tuesday July 14 2017 07:29:16 EST Ventricular Rate:  79 PR Interval:    QRS Duration: 87 QT Interval:  365 QTC Calculation: 419 R Axis:   79 Text Interpretation:  Sinus rhythm Baseline wander in lead(s) V5 No significant change was found Benign early repolarization Confirmed by Azalia Bilisampos, Kevin ([id number]) on 07/14/2017 8:37:54 AM       Radiology No results found.  Procedures Procedures (including critical care time)  Medications Ordered in ED Medications - No data to display   Initial Impression / Assessment and Plan / ED Course  I have reviewed the triage vital signs and the nursing notes.  Pertinent labs & imaging results that were available during my care of the patient were reviewed by me and considered in my medical decision making (see chart for details).  Clinical Course as of Jul 15 1111  Tue Jul 14, 2017  1610 Pt not taking depakote Valproic Acid,S: (!) <10 [JR]  0813 Pt not taking phenytoin Phenytoin Lvl: (!) <2.5 [JR]    Clinical Course User Index [JR] Robinson, [place] N, PA-C   Pt with epilepsy, presenting to ED s/p seizure that occurred this morning. His seizure threshold may have been lowered d/t recent abscess that was treated with abx. No reported head trauma. Small laceration to his tongue, no closure needed. Patient with no evidence of focal neuro deficits on physical exam. Labs and EKG reassuring. Pt states he feels tired the remainder of the day following a seizure and that is normal for him. He is able to ambulate without difficulty, alert and oriented prior to discharge. He is aware that his tegretol level is subtherapeutic and he needs to contact his neurologist. He also verbalizes understanding that if post-ictal period persists longer than usual, he is to report back to ED. Patient is hemodynamically stable and in no acute distress prior to discharge in care of his significant  other.  Patient discussed with Dr. Patria Mane, who agrees with care plan.  Discussed results, findings, treatment and follow up. Patient advised of return precautions. Patient verbalized understanding and agreed with plan.  Final Clinical Impressions(s) / ED Diagnoses   Final diagnoses:  Seizure [facility])    ED Discharge Orders    None       Robinson, [place] N, PA-C 07/14/17 1112    Azalia Bilis, MD 07/15/17 [phone]

## 2017-07-14 NOTE — Discharge Instructions (Signed)
Please call your neurologist about your visit today, as well as your Tegretol level of 3.1. Return to the ER if your fatigue and sleepiness persists longer than usual.

## 2017-07-29 MED FILL — levETIRAcetam 750 MG TABS: 750 | 30 days supply | Qty: 120 | Fill #1

## 2017-07-29 MED FILL — CARBAMAZEPINE XR 200 MG TAB: 200 | 30 days supply | Qty: 180 | Fill #1

## 2017-08-20 ENCOUNTER — Telehealth: Payer: Self-pay | Admitting: *Deleted

## 2017-08-20 NOTE — Telephone Encounter (Signed)
Called mother, on DPR to discuss patient's ED visit on 07/14/17 for seizure. She stated he was on antibiotic for boil which the ED dr determined must have lowered his seizure threshold. Mother stated he was taking his seizure medications, has continued to take them with no further seizures.  This RN advised he also needs a follow up around March 2019; advised if he is doing well and doesn't have any further seizure activity he may see a NP.  Mother stated she would have patient call and schedule a follow up. She verbalized understanding, appreciation of call.

## 2017-08-20 NOTE — Telephone Encounter (Signed)
Agree with plan. --VRP 

## 2017-08-21 ENCOUNTER — Telehealth: Payer: Self-pay | Admitting: Diagnostic Neuroimaging

## 2017-08-21 NOTE — Telephone Encounter (Signed)
Pt is scheduled for an appt on 02/08/18 and is wanting to know since he was just in the hospital if he should be seen sooner. Pt is wanting to speak with someone to find out more.

## 2017-08-21 NOTE — Telephone Encounter (Signed)
Spoke with patient and informed him of conversation this RN had with his mother yesterday. He stated he was fine with June 2019 follow up, but he was curious why his 6 month FU was cancelled. This RN explained that staff attempted to reach him x 2, then mailed a letter because Dr Richrd HumblesPenumalli's template is changed for 2019. So his FU needed to be moved. This RN offered to try and move June FU sooner, but patient stated it was fine to keep it. This RN advised he call prior to FU for any concerns, questions, problems. Patient verbalized understanding, appreciation .

## 2017-08-28 MED FILL — CARBAMAZEPINE XR 200 MG TAB: 200 | 30 days supply | Qty: 180 | Fill #2

## 2017-08-28 MED FILL — levETIRAcetam 750 MG TABS: 750 | 30 days supply | Qty: 120 | Fill #2

## 2017-09-25 MED FILL — CARBAMAZEPINE XR 200 MG TAB: 200 | 30 days supply | Qty: 180 | Fill #3

## 2017-09-25 MED FILL — levETIRAcetam 750 MG TABS: 750 | 30 days supply | Qty: 120 | Fill #3

## 2017-10-26 MED FILL — levETIRAcetam 750 MG TABS: 750 | 30 days supply | Qty: 120 | Fill #4

## 2017-10-26 MED FILL — CARBAMAZEPINE XR 200 MG TAB: 200 | 30 days supply | Qty: 180 | Fill #4

## 2017-11-13 MED FILL — IBUPROFEN 600 MG TABLET: 600 | 7 days supply | Qty: 30 | Fill #0

## 2017-11-13 MED FILL — PENICILLIN VK 500 MG TABLET: 500 | 14 days supply | Qty: 56 | Fill #0

## 2017-11-13 MED FILL — CHLORHEXIDINE 0.12% RINSE: 0.12 | 15 days supply | Qty: 473 | Fill #0

## 2017-11-13 MED FILL — HYDROCODON-APAP 5-325: 5-325 | 5 days supply | Qty: 20 | Fill #0

## 2017-11-20 ENCOUNTER — Ambulatory Visit: Payer: Medicaid Other | Admitting: Diagnostic Neuroimaging

## 2017-11-25 MED FILL — levETIRAcetam 750 MG TABS: 750 | 30 days supply | Qty: 120 | Fill #5

## 2017-11-25 MED FILL — CARBAMAZEPINE XR 200 MG TAB: 200 | 30 days supply | Qty: 180 | Fill #5

## 2017-12-28 MED FILL — levETIRAcetam 750 MG TABS: 750 | 30 days supply | Qty: 120 | Fill #6

## 2017-12-28 MED FILL — CARBAMAZEPINE XR 200 MG TAB: 200 | 30 days supply | Qty: 180 | Fill #6

## 2018-01-25 MED FILL — CARBAMAZEPINE XR 200 MG TAB: 200 | 30 days supply | Qty: 180 | Fill #7

## 2018-01-25 MED FILL — levETIRAcetam 750 MG TABS: 750 | 30 days supply | Qty: 120 | Fill #7

## 2018-02-08 ENCOUNTER — Ambulatory Visit: Payer: BLUE CROSS/BLUE SHIELD | Admitting: Diagnostic Neuroimaging

## 2018-02-09 ENCOUNTER — Ambulatory Visit (INDEPENDENT_AMBULATORY_CARE_PROVIDER_SITE_OTHER): Payer: BLUE CROSS/BLUE SHIELD | Admitting: Diagnostic Neuroimaging

## 2018-02-09 ENCOUNTER — Encounter: Payer: Self-pay | Admitting: Diagnostic Neuroimaging

## 2018-02-09 ENCOUNTER — Encounter

## 2018-02-09 VITALS — BP 133/72 | HR 69 | Ht 75.0 in | Wt 204.6 lb

## 2018-02-09 DIAGNOSIS — G40909 Epilepsy, unspecified, not intractable, without status epilepticus: Secondary | ICD-10-CM | POA: Diagnosis not present

## 2018-02-09 MED ORDER — CARBAMAZEPINE ER 200 MG PO TB12: 600.0000 mg | ORAL_TABLET | Freq: Two times a day (BID) | ORAL | 4 refills | Status: DC

## 2018-02-09 MED ORDER — LEVETIRACETAM 750 MG PO TABS: 1500.0000 mg | ORAL_TABLET | Freq: Two times a day (BID) | ORAL | 4 refills | Status: DC

## 2018-02-09 NOTE — Progress Notes (Signed)
[facility]  PATIENT: Ricky Humphrey DOB: 1991-06-08  REFERRING CLINICIAN:  HISTORY FROM: patient REASON FOR VISIT: follow up   HISTORICAL  CHIEF COMPLAINT:  Chief Complaint  Patient presents with  . Epilepsy    rm 7, "no seizures, no tremors"  . Follow-up    HISTORY OF PRESENT ILLNESS:   UPDATE (02/09/18, VRP): Since last visit, doing until 07/14/17 and had another seizure. CBZ level was low (patient may have missed 1 dose) and he also had abscess a few weeks earlier. Tolerating meds. No alleviating or aggravating factors.   UPDATE (05/08/17, VRP): Since last visit, was doing well until 05/08/17 --> had breakthrough seizure at home. Felt some warning when he woke up at 5:30am then had witnessed generalized convulsive seizure with tongue biting; no incontinence. Woke up in the ambulance. Only factor may have been increased stress related to a recent job promotion. Had low CBZ level in ER which was inadjusted with higher dosing. No missed doses of medications. Tolerating new dosing. No alleviating or aggravating factors.   UPDATE 06/13/16: Since last visit, HA are daily. Taking daily ibuprofen. Having interrupted sleep and excessive daytime fatigue. Drinks 1-2 cups coffee in AM, and 1 soda in evening. No seizures.  UPDATE 10/16/15: Since last visit, no seizures. Tolerating meds. Good compliance. Unfortunately, he was at work, bent down to reach for a package, and then struck his head on extender platform from the truck. He had severe pain, but no LOC. This happened 09/06/15. Went to ER, CT head showed no acute findings. Arachnoid cyst slightly larger than 2013, but no major findings.  UPDATE 04/23/15: Since last visit no sz. Last sz Oct 2014. Tolerating meds without issues. Very good compliance with medications.  UPDATE 07/17/14: Since last visit, doing well and no seizures. Asking about possibility of applying for driver's license. Tolerating medications.  No new events of issues.   UPDATE 06/20/13: Since last visit, patient was doing well until breakthrough seizure on 06/15/13. Apparently his pharmacy changed supplier of Carbatrol to a different manufacturer and did not give patient full refill of medication. Patient ended up missing 3 successive doses of Carbatrol and then had multiple back-to-back seizures without return of consciousness. Patient was admitted to the hospital, monitored in the intensive care unit, discharged one day later. Since that time patient has returned to baseline. He still feels a little but sore, especially his tongue. Patient acknowledges missing intermittent doses of medication over the past one year, but usually without any consequence. Patient continues to have problems with insomnia and shifted sleep cycle. He typically goes to sleep at 3 AM and wakes up at noon or 2 PM. He drinks alcohol on social basis.  UPDATE 03/02/12: Doing about the same. No new seizures. Tolerating meds. Asking about driving.   UPDATE 11/19/11: On 09/23/11, pt admitted for breakthrough sz. He had run out of carbatrol, and level was undetectable when checked. Also dx'd with aflutter and has followed up with Dr. Jacinto Halim. No further events.  PRIOR HPI: 27 year old left-handed male with history of mild cerebral palsy, here for evaluation of seizure disorder. Patient is product of full-term gestation, 18 hour labor, attempted vaginal delivery with foreceps, with emergent c-section.  Patient developed normally from cognitive standpoint. He had mild right-sided incoordination.  Neuroimaging study demonstrated left brain cyst, and patient was diagnosed with cerebral palsy. Patient had no further problems until age 32 years old, when he was freshman in high school, when he had  a new onset generalized convulsive seizure with tongue biting and incontinence at school. He went to the emergency room and a second seizure in the CT scan. He started on Keppra.  He continued to  have seizures once per week.  Due to the presence of left brain cyst, he underwent left brain craniotomy, with unsuccessful cyst resection.  Then Carbatrol was added to his regimen. His seizure frequency declined to once every 6-9 months. He had breakthrough seizure in April 2012 with concomitant strep infection. Most recent breakthrough seizure was 2 days ago on 07/27/11. Nowadays typical seizures consist of right arm posturing and convulsions, followed by loss of consciousness.   REVIEW OF SYSTEMS: Full 14 system review of systems performed and negative except: memory loss dizziness headache numbness cough back pain snoring.   ALLERGIES: No Known Allergies  HOME MEDICATIONS: Outpatient Medications Prior to Visit  Medication Sig Dispense Refill  . carbamazepine (TEGRETOL XR) 200 MG 12 hr tablet Take 3 tablets (600 mg total) by mouth 2 (two) times daily. 540 tablet 4  . levETIRAcetam (KEPPRA) 750 MG tablet Take 2 tablets (1,500 mg total) by mouth 2 (two) times daily. 360 tablet 4   No facility-administered medications prior to visit.     PAST MEDICAL HISTORY: Past Medical History:  Diagnosis Date  . Arachnoid cyst    s/p drainage by craniotomy  . Cerebral palsy (HCC)    mild  . Epilepsy (HCC)   . Headache   . Paroxysmal atrial flutter (HCC)    brief, post sz on 09/22/11  . Seizures (HCC)     PAST SURGICAL HISTORY: Past Surgical History:  Procedure Laterality Date  . acrnoid cyst    . TONSILLECTOMY AND ADENOIDECTOMY      FAMILY HISTORY: Family History  Problem Relation Age of Onset  . Migraines Mother   . Diabetes Mother   . Asthma Brother     SOCIAL HISTORY:  Social History   Socioeconomic History  . Marital status: Single    Spouse name: girlfriend-Amanda  . Number of children: 1  . Years of education: College  . Highest education level: Some college, no degree  Occupational History  . Occupation: Event organiserupervisor    Employer: [employer]  Social Needs  . Financial  resource strain: Not on file  . Food insecurity:    Worry: Not on file    Inability: Not on file  . Transportation needs:    Medical: Not on file    Non-medical: Not on file  Tobacco Use  . Smoking status: Never Smoker  . Smokeless tobacco: Never Used  Substance and Sexual Activity  . Alcohol use: No    Alcohol/week: 0.0 oz  . Drug use: No  . Sexual activity: Not on file  Lifestyle  . Physical activity:    Days per week: Not on file    Minutes per session: Not on file  . Stress: Not on file  Relationships  . Social connections:    Talks on phone: Not on file    Gets together: Not on file    Attends religious service: Not on file    Active member of club or organization: Not on file    Attends meetings of clubs or organizations: Not on file    Relationship status: Not on file  . Intimate partner violence:    Fear of current or ex partner: Not on file    Emotionally abused: Not on file    Physically abused: Not on file  Forced sexual activity: Not on file  Other Topics Concern  . Not on file  Social History Narrative   Patient lives with a roommate.   First child, daughter born May 2019   College at [employer].    Caffeine Use: 2-3 cups daily     PHYSICAL EXAM  Vitals:   02/09/18 1543  BP: 133/72  Pulse: 69  Weight: 204 lb 9.6 oz (92.8 kg)  Height: 6\' 3"  (1.905 m)    Not recorded      Body mass index is 25.57 kg/m.  GENERAL EXAM: Patient is in no distress  CARDIOVASCULAR: Regular rate and rhythm, no murmurs, no carotid bruits  NEUROLOGIC: MENTAL STATUS: awake, alert, language fluent, comprehension intact, naming intact CRANIAL NERVE: pupils equal and reactive to light, visual fields full to confrontation, extraocular muscles intact, no nystagmus, facial sensation and strength symmetric, uvula midline, shoulder shrug symmetric, tongue midline. MOTOR: normal bulk and tone, full strength in the BUE, BLE SENSORY: normal and symmetric to light touch,  pinprick, temperature, vibration COORDINATION: finger-nose-finger, fine finger movements normal; SLOW TAPPING IN RUE AND RLE.  REFLEXES: deep tendon reflexes present and symmetric GAIT/STATION: narrow based gait; romberg is negative    DIAGNOSTIC DATA (LABS, IMAGING, TESTING) - I reviewed patient records, labs, notes, testing and imaging myself where available.  Lab Results  Component Value Date   WBC 10.2 07/14/2017   HGB 14.1 07/14/2017   HCT 40.9 07/14/2017   MCV 94.2 07/14/2017   PLT 239 07/14/2017      Component Value Date/Time   NA 137 07/14/2017 0644   NA 142 06/13/2016 1212   K 4.0 07/14/2017 0644   CL 105 07/14/2017 0644   CO2 19 (L) 07/14/2017 0644   GLUCOSE 73 07/14/2017 0644   BUN 14 07/14/2017 0644   BUN 13 06/13/2016 1212   CREATININE 1.27 (H) 07/14/2017 0644   CALCIUM 8.8 (L) 07/14/2017 0644   PROT 5.7 (L) 05/09/2017 0443   PROT 6.6 06/13/2016 1212   ALBUMIN 3.0 (L) 05/09/2017 0443   ALBUMIN 3.9 06/13/2016 1212   AST 36 05/09/2017 0443   ALT 33 05/09/2017 0443   ALKPHOS 49 05/09/2017 0443   BILITOT 0.6 05/09/2017 0443   BILITOT 0.5 06/13/2016 1212   GFRNONAA >60 07/14/2017 0644   GFRAA >60 07/14/2017 0644   No results found for: CHOL Lab Results  Component Value Date   HGBA1C 4.8 06/14/2016   No results found for: [id number] Lab Results  Component Value Date   TSH 0.618 06/13/2016   Lab Results  Component Value Date   PHENYTOIN <2.5 (L) 07/14/2017   VALPROATE <10 (L) 07/14/2017   CBMZ 3.1 (L) 07/14/2017   09/22/11 CT head  - Prior left temporal craniotomy. Large CSF attenuation collection extra-axial in left parietal region, consistent with arachnoid cyst. Few linear areas of slightly higher attenuation are seen within the anterior aspect of this collection, question septations, uncertain etiology; consider either comparison to prior outside exams to assess stability or further evaluation by MR imaging with and without contrast to  assess.  09/06/15 CT head  - No acute intracranial hemorrhage. Left parietal arachnoid cyst grossly stable or minimally increased compared to the prior study.    ASSESSMENT AND PLAN  27 y.o.  left-handed male with mild cerebral palsy and complex partial seizures with secondary generalization.  Patient was having breakthrough seizures every 6-9 months. Was sz free since Oct 2014 with better dosing of anti-seizure medications, until Sept 2018.   Also  with post-traumatic headaches since Sep 06, 2015; CT head and neuro exam unremarkable. Advised conservative mgmt for now.  Last seizures 05/12/17, 07/14/17.   Dx:  Seizure disorder (HCC) - Plan: Carbamazepine level, total     PLAN:  SEIZURE DISRORDER (established problem, worsening)  - continue levetiracetam 1500mg  twice a day   - continue carbamazepine XR 600mg  twice a day   - According to [place] law, you can not drive unless you are seizure / syncope free for at least 6 months and under physician's care.   - Please maintain precautions. Do not participate in activities where a loss of awareness could harm you or someone else. No swimming alone, no tub bathing, no hot tubs, no driving, no operating motorized vehicles (cars, ATVs, motocycles, etc), lawnmowers, power tools or firearms. No standing at heights, such as rooftops, ladders or stairs. Avoid hot objects such as stoves, heaters, open fires. Wear a helmet when riding a bicycle, scooter, skateboard, etc. and avoid areas of traffic. Set your water heater to 120 degrees or less.  Meds ordered this encounter  Medications  . levETIRAcetam (KEPPRA) 750 MG tablet    Sig: Take 2 tablets (1,500 mg total) by mouth 2 (two) times daily.    Dispense:  360 tablet    Refill:  4  . carbamazepine (TEGRETOL XR) 200 MG 12 hr tablet    Sig: Take 3 tablets (600 mg total) by mouth 2 (two) times daily.    Dispense:  540 tablet    Refill:  4   Return in about 8 months (around  10/12/2018).    Suanne Marker, MD 02/09/2018, 4:11 PM Certified in Neurology, Neurophysiology and Neuroimaging  [facility] [address], [address] [place], [place] [address] [phone]

## 2018-02-10 LAB — CARBAMAZEPINE LEVEL, TOTAL: Carbamazepine (Tegretol), S: 7.3 ug/mL (ref 4.0–12.0)

## 2018-02-11 ENCOUNTER — Telehealth: Payer: Self-pay | Admitting: *Deleted

## 2018-02-11 NOTE — Telephone Encounter (Signed)
LVM informing patient his carbamazepine  lab was a normal level. Advised he continue his current medications as prescribed.  Left number for any questions.

## 2018-02-23 MED FILL — CARBAMAZEPINE XR 200 MG TAB: 200 | 30 days supply | Qty: 180 | Fill #8

## 2018-02-23 MED FILL — levETIRAcetam 750 MG TABS: 750 | 30 days supply | Qty: 120 | Fill #8

## 2018-03-26 MED FILL — levETIRAcetam 750 MG TABS: 750 | 30 days supply | Qty: 120 | Fill #9

## 2018-03-26 MED FILL — CARBAMAZEPINE XR 200 MG TAB: 200 | 30 days supply | Qty: 180 | Fill #9

## 2018-04-30 MED FILL — levETIRAcetam 750 MG TABS: 750 | 30 days supply | Qty: 120 | Fill #10

## 2018-04-30 MED FILL — CARBAMAZEPINE XR 200 MG TAB: 200 | 30 days supply | Qty: 180 | Fill #10

## 2018-05-31 MED FILL — levETIRAcetam 750 MG TABS: 750 | 30 days supply | Qty: 120 | Fill #0

## 2018-05-31 MED FILL — CARBAMAZEPINE XR 200 MG TAB: 200 | 30 days supply | Qty: 180 | Fill #0

## 2018-07-02 MED FILL — levETIRAcetam 750 MG TABS: 750 | 30 days supply | Qty: 120 | Fill #1

## 2018-07-06 ENCOUNTER — Telehealth: Payer: Self-pay | Admitting: *Deleted

## 2018-07-06 NOTE — Telephone Encounter (Signed)
[id number] Carbamazepine XR 200mg  tabs.  Need to initate by calling to fax.  I faxed with receiving confirmation [phone],  Phone [phone].

## 2018-07-07 NOTE — Telephone Encounter (Signed)
I spoke to Gene Ref ID [id number] with [employer] for PA approval on carbamazepine XR 200mg  and they have not received fax.  Refaxed to [phone] with confirmation.

## 2018-07-09 ENCOUNTER — Telehealth: Payer: Self-pay | Admitting: Neurology

## 2018-07-09 MED ORDER — TEGRETOL-XR 200 MG PO TB12: 600.0000 mg | ORAL_TABLET | Freq: Two times a day (BID) | ORAL | 12 refills | Status: DC

## 2018-07-09 MED FILL — TEGRETOL XR 200 MG TABLET: 200 | 30 days supply | Qty: 180 | Fill #0

## 2018-07-09 NOTE — Telephone Encounter (Signed)
[facility], the carbamazepine/Tegretol prescription sent in today requires a prior authorization.

## 2018-07-09 NOTE — Telephone Encounter (Signed)
Called pharmacy, spoke with Arlys JohnBrian, pharmacist who stated the new Rx was just received, and he will order brand name Tegretol XR. He stated that the message received by Dr Anne HahnWillis was probably sent previously. Arlys JohnBrian stated they will give patient carbamazepine to get him through the weekend until Tegretol XR comes in. He verbalized understanding of call.

## 2018-07-09 NOTE — Telephone Encounter (Addendum)
Received a call from Enedina Finnerhristie, [place] stating the patient was given 7 days of carbamazepine as a courtesy while waiting on Medicaid approval. This RN advised her the PA is still pending. Lorene DyChristie stated they can dispense brand if Dr Marjory LiesPenumalli agrees because it is approved by Medicaid. The patient will run out of medication this weekend. Lorene DyChristie stated a new Rx will be needed that states DAW, Tegretol. Will route to Dr Marjory LiesPenumalli.

## 2018-07-09 NOTE — Telephone Encounter (Signed)
[facility] Christie wasn't available. Spoke with pharmacy tech and advised her Dr Marjory LiesPenumalli sent in new Rx for Tegretol XR brand. She verbalized understanding.

## 2018-07-09 NOTE — Telephone Encounter (Signed)
BRAND TEGRETOL XR SENT IN. -VRP

## 2018-07-09 NOTE — Addendum Note (Signed)
Addended by: Joycelyn SchmidPENUMALLI, Dynasty Holquin R on: 07/09/2018 12:40 PM   Modules accepted: Orders

## 2018-07-12 NOTE — Telephone Encounter (Signed)
Received fax from [facility] pharmacy asking for PA on carbamazepine XR. This RN called pharmacy, spoke with Penni BombardKendall and advised her Dr Marjory LiesPenumalli changed him to Tegretol XR. She stated the patient received carbamazepine to get him through the past weekend. Tegretol XR was ordered and is now ready for him to pick up the balance of his Rx. She stated to disregard to fax received about PA.

## 2018-08-06 MED FILL — TEGRETOL XR 200 MG TABLET: 200 | 30 days supply | Qty: 180 | Fill #1

## 2018-08-06 MED FILL — levETIRAcetam 750 MG TABS: 750 | 30 days supply | Qty: 120 | Fill #2

## 2018-09-07 MED FILL — levETIRAcetam 750 MG TABS: 750 | 30 days supply | Qty: 120 | Fill #3

## 2018-09-07 MED FILL — TEGRETOL XR 200 MG TABLET: 200 | 30 days supply | Qty: 180 | Fill #2

## 2018-10-13 MED FILL — levETIRAcetam 750 MG TABS: 750 | 30 days supply | Qty: 120 | Fill #4

## 2018-10-13 MED FILL — TEGRETOL XR 200 MG TABLET: 200 | 30 days supply | Qty: 180 | Fill #3

## 2018-10-19 ENCOUNTER — Ambulatory Visit (INDEPENDENT_AMBULATORY_CARE_PROVIDER_SITE_OTHER): Payer: BLUE CROSS/BLUE SHIELD | Admitting: Diagnostic Neuroimaging

## 2018-10-19 ENCOUNTER — Encounter: Payer: Self-pay | Admitting: Diagnostic Neuroimaging

## 2018-10-19 VITALS — BP 144/75 | HR 79 | Ht 76.0 in | Wt 225.0 lb

## 2018-10-19 DIAGNOSIS — G40909 Epilepsy, unspecified, not intractable, without status epilepticus: Secondary | ICD-10-CM

## 2018-10-19 MED ORDER — TEGRETOL-XR 200 MG PO TB12: 600.0000 mg | ORAL_TABLET | Freq: Two times a day (BID) | ORAL | 12 refills | Status: DC

## 2018-10-19 MED ORDER — LEVETIRACETAM 750 MG PO TABS: 1500.0000 mg | ORAL_TABLET | Freq: Two times a day (BID) | ORAL | 4 refills | Status: DC

## 2018-10-19 NOTE — Progress Notes (Signed)
[facility]  PATIENT: Ricky Humphrey DOB: [birth year]  REFERRING CLINICIAN:  HISTORY FROM: patient REASON FOR VISIT: follow up   HISTORICAL  CHIEF COMPLAINT:  Chief Complaint  Patient presents with  . Seizures    rm 7 "no seizure activity"  . Follow-up    8 month    HISTORY OF PRESENT ILLNESS:   UPDATE (10/19/18, VRP): Since last visit, doing well. No seizures. Tolerating meds. New baby daughter at home (11 months old). No alleviating or aggravating factors.    UPDATE (02/09/18, VRP): Since last visit, doing until 07/14/17 and had another seizure. CBZ level was low (patient may have missed 1 dose) and he also had abscess a few weeks earlier. Tolerating meds. No alleviating or aggravating factors.   UPDATE (05/08/17, VRP): Since last visit, was doing well until 05/08/17 --> had breakthrough seizure at home. Felt some warning when he woke up at 5:30am then had witnessed generalized convulsive seizure with tongue biting; no incontinence. Woke up in the ambulance. Only factor may have been increased stress related to a recent job promotion. Had low CBZ level in ER which was inadjusted with higher dosing. No missed doses of medications. Tolerating new dosing. No alleviating or aggravating factors.   UPDATE 06/13/16: Since last visit, HA are daily. Taking daily ibuprofen. Having interrupted sleep and excessive daytime fatigue. Drinks 1-2 cups coffee in AM, and 1 soda in evening. No seizures.  UPDATE 10/16/15: Since last visit, no seizures. Tolerating meds. Good compliance. Unfortunately, he was at work, bent down to reach for a package, and then struck his head on extender platform from the truck. He had severe pain, but no LOC. This happened 09/06/15. Went to ER, CT head showed no acute findings. Arachnoid cyst slightly larger than 2013, but no major findings.  UPDATE 04/23/15: Since last visit no sz. Last sz Oct 2014. Tolerating meds without issues. Very good  compliance with medications.  UPDATE 07/17/14: Since last visit, doing well and no seizures. Asking about possibility of applying for driver's license. Tolerating medications. No new events of issues.   UPDATE 06/20/13: Since last visit, patient was doing well until breakthrough seizure on 06/15/13. Apparently his pharmacy changed supplier of Carbatrol to a different manufacturer and did not give patient full refill of medication. Patient ended up missing 3 successive doses of Carbatrol and then had multiple back-to-back seizures without return of consciousness. Patient was admitted to the hospital, monitored in the intensive care unit, discharged one day later. Since that time patient has returned to baseline. He still feels a little but sore, especially his tongue. Patient acknowledges missing intermittent doses of medication over the past one year, but usually without any consequence. Patient continues to have problems with insomnia and shifted sleep cycle. He typically goes to sleep at 3 AM and wakes up at noon or 2 PM. He drinks alcohol on social basis.  UPDATE 03/02/12: Doing about the same. No new seizures. Tolerating meds. Asking about driving.   UPDATE 11/19/11: On 09/23/11, pt admitted for breakthrough sz. He had run out of carbatrol, and level was undetectable when checked. Also dx'd with aflutter and has followed up with Dr. Jacinto Halim. No further events.  PRIOR HPI: 28 year old left-handed male with history of mild cerebral palsy, here for evaluation of seizure disorder. Patient is product of full-term gestation, 18 hour labor, attempted vaginal delivery with foreceps, with emergent c-section.  Patient developed normally from cognitive standpoint. He had mild right-sided incoordination.  Neuroimaging  study demonstrated left brain cyst, and patient was diagnosed with cerebral palsy. Patient had no further problems until age 23 years old, when he was freshman in high school, when he had a new onset  generalized convulsive seizure with tongue biting and incontinence at school. He went to the emergency room and a second seizure in the CT scan. He started on Keppra.  He continued to have seizures once per week.  Due to the presence of left brain cyst, he underwent left brain craniotomy, with unsuccessful cyst resection.  Then Carbatrol was added to his regimen. His seizure frequency declined to once every 6-9 months. He had breakthrough seizure in April 2012 with concomitant strep infection. Most recent breakthrough seizure was 2 days ago on 07/27/11. Nowadays typical seizures consist of right arm posturing and convulsions, followed by loss of consciousness.   REVIEW OF SYSTEMS: Full 14 system review of systems performed and negative except: memory loss dizziness headache numbness cough back pain snoring.   ALLERGIES: No Known Allergies  HOME MEDICATIONS: Outpatient Medications Prior to Visit  Medication Sig Dispense Refill  . levETIRAcetam (KEPPRA) 750 MG tablet Take 2 tablets (1,500 mg total) by mouth 2 (two) times daily. 360 tablet 4  . TEGRETOL-XR 200 MG 12 hr tablet Take 3 tablets (600 mg total) by mouth 2 (two) times daily. 180 tablet 12   No facility-administered medications prior to visit.     PAST MEDICAL HISTORY: Past Medical History:  Diagnosis Date  . Arachnoid cyst    s/p drainage by craniotomy  . Cerebral palsy (HCC)    mild  . Epilepsy (HCC)   . Headache   . Paroxysmal atrial flutter (HCC)    brief, post sz on 09/22/11  . Seizures (HCC)     PAST SURGICAL HISTORY: Past Surgical History:  Procedure Laterality Date  . acrnoid cyst    . TONSILLECTOMY AND ADENOIDECTOMY      FAMILY HISTORY: Family History  Problem Relation Age of Onset  . Migraines Mother   . Diabetes Mother   . Asthma Brother     SOCIAL HISTORY:  Social History   Socioeconomic History  . Marital status: Single    Spouse name: girlfriend-Amanda  . Number of children: 1  . Years of  education: College  . Highest education level: Some college, no degree  Occupational History  . Occupation: Event organiser: [employer]  Social Needs  . Financial resource strain: Not on file  . Food insecurity:    Worry: Not on file    Inability: Not on file  . Transportation needs:    Medical: Not on file    Non-medical: Not on file  Tobacco Use  . Smoking status: Never Smoker  . Smokeless tobacco: Never Used  Substance and Sexual Activity  . Alcohol use: No    Alcohol/week: 0.0 standard drinks  . Drug use: No  . Sexual activity: Not on file  Lifestyle  . Physical activity:    Days per week: Not on file    Minutes per session: Not on file  . Stress: Not on file  Relationships  . Social connections:    Talks on phone: Not on file    Gets together: Not on file    Attends religious service: Not on file    Active member of club or organization: Not on file    Attends meetings of clubs or organizations: Not on file    Relationship status: Not on file  . Intimate  partner violence:    Fear of current or ex partner: Not on file    Emotionally abused: Not on file    Physically abused: Not on file    Forced sexual activity: Not on file  Other Topics Concern  . Not on file  Social History Narrative   Patient lives with a roommate.   First child, daughter born May 2019   College at [employer].    Caffeine Use: 2-3 cups daily     PHYSICAL EXAM  GENERAL EXAM/CONSTITUTIONAL: Vitals:  Vitals:   10/19/18 0823  BP: (!) 144/75  Pulse: 79  Weight: 225 lb (102.1 kg)  Height: 6\' 4"  (1.93 m)     Body mass index is 27.39 kg/m. Wt Readings from Last 3 Encounters:  10/19/18 225 lb (102.1 kg)  02/09/18 204 lb 9.6 oz (92.8 kg)  07/04/17 195 lb 3.2 oz (88.5 kg)     Patient is in no distress; well developed, nourished and groomed; neck is supple  CARDIOVASCULAR:  Examination of carotid arteries is normal; no carotid bruits  Regular rate and rhythm, no murmurs  Examination  of peripheral vascular system by observation and palpation is normal  EYES:  Ophthalmoscopic exam of optic discs and posterior segments is normal; no papilledema or hemorrhages  No exam data present  MUSCULOSKELETAL:  Gait, strength, tone, movements noted in Neurologic exam below  NEUROLOGIC: MENTAL STATUS:  No flowsheet data found.  awake, alert, oriented to person, place and time  recent and remote memory intact  normal attention and concentration  language fluent, comprehension intact, naming intact  fund of knowledge appropriate  CRANIAL NERVE:   2nd - no papilledema on fundoscopic exam  2nd, 3rd, 4th, 6th - pupils equal and reactive to light, visual fields full to confrontation, extraocular muscles intact, no nystagmus  5th - facial sensation symmetric  7th - facial strength symmetric  8th - hearing intact  9th - palate elevates symmetrically, uvula midline  11th - shoulder shrug symmetric  12th - tongue protrusion midline  MOTOR:   normal bulk and tone, full strength in the BUE, BLE  SENSORY:   normal and symmetric to light touch  COORDINATION:   finger-nose-finger, fine finger movements normal  REFLEXES:   deep tendon reflexes present and symmetric  GAIT/STATION:   narrow based gait     DIAGNOSTIC DATA (LABS, IMAGING, TESTING) - I reviewed patient records, labs, notes, testing and imaging myself where available.  Lab Results  Component Value Date   WBC 10.2 07/14/2017   HGB 14.1 07/14/2017   HCT 40.9 07/14/2017   MCV 94.2 07/14/2017   PLT 239 07/14/2017      Component Value Date/Time   NA 137 07/14/2017 0644   NA 142 06/13/2016 1212   K 4.0 07/14/2017 0644   CL 105 07/14/2017 0644   CO2 19 (L) 07/14/2017 0644   GLUCOSE 73 07/14/2017 0644   BUN 14 07/14/2017 0644   BUN 13 06/13/2016 1212   CREATININE 1.27 (H) 07/14/2017 0644   CALCIUM 8.8 (L) 07/14/2017 0644   PROT 5.7 (L) 05/09/2017 0443   PROT 6.6 06/13/2016 1212    ALBUMIN 3.0 (L) 05/09/2017 0443   ALBUMIN 3.9 06/13/2016 1212   AST 36 05/09/2017 0443   ALT 33 05/09/2017 0443   ALKPHOS 49 05/09/2017 0443   BILITOT 0.6 05/09/2017 0443   BILITOT 0.5 06/13/2016 1212   GFRNONAA >60 07/14/2017 0644   GFRAA >60 07/14/2017 0644   No results found for: CHOL Lab  Results  Component Value Date   HGBA1C 4.8 06/14/2016   No results found for: [id number] Lab Results  Component Value Date   TSH 0.618 06/13/2016   Lab Results  Component Value Date   PHENYTOIN <2.5 (L) 07/14/2017   VALPROATE <10 (L) 07/14/2017   CBMZ 7.3 02/09/2018   09/22/11 CT head  - Prior left temporal craniotomy. Large CSF attenuation collection extra-axial in left parietal region, consistent with arachnoid cyst. Few linear areas of slightly higher attenuation are seen within the anterior aspect of this collection, question septations, uncertain etiology; consider either comparison to prior outside exams to assess stability or further evaluation by MR imaging with and without contrast to assess.  09/06/15 CT head  - No acute intracranial hemorrhage. Left parietal arachnoid cyst grossly stable or minimally increased compared to the prior study.    ASSESSMENT AND PLAN  28 y.o.  left-handed male with mild cerebral palsy and complex partial seizures with secondary generalization.  Patient was having breakthrough seizures every 6-9 months. Was sz free since Oct 2014 with better dosing of anti-seizure medications, until Sept 2018.   Also with post-traumatic headaches since Sep 06, 2015; CT head and neuro exam unremarkable. Advised conservative mgmt for now.  Last seizures 05/12/17, 07/14/17.   Dx:  Seizure disorder (HCC) - Plan: CBC with Differential/Platelet, Comprehensive metabolic panel     PLAN:  SEIZURE DISRORDER (established problem, improved; last seizure Nov 2018)  - continue levetiracetam 1500mg  twice a day   - continue carbamazepine XR 600mg  twice a day   - check  labs  Meds ordered this encounter  Medications  . TEGRETOL-XR 200 MG 12 hr tablet    Sig: Take 3 tablets (600 mg total) by mouth 2 (two) times daily.    Dispense:  180 tablet    Refill:  12    BRAND MEDICALLY NECESSARY  . levETIRAcetam (KEPPRA) 750 MG tablet    Sig: Take 2 tablets (1,500 mg total) by mouth 2 (two) times daily.    Dispense:  360 tablet    Refill:  4   Orders Placed This Encounter  Procedures  . CBC with Differential/Platelet  . Comprehensive metabolic panel   Return in about 1 year (around 10/20/2019).    Suanne MarkerVIKRAM R. Marshun Duva, MD 10/19/2018, 8:46 AM Certified in Neurology, Neurophysiology and Neuroimaging  [facility] [address], [address] [place], [place] [address] [phone]

## 2018-10-20 LAB — CBC WITH DIFFERENTIAL/PLATELET
BASOS ABS: 0.1 10*3/uL (ref 0.0–0.2)
Basos: 1 %
EOS (ABSOLUTE): 0.2 10*3/uL (ref 0.0–0.4)
EOS: 3 %
HEMATOCRIT: 44.4 % (ref 37.5–51.0)
Hemoglobin: 14.8 g/dL (ref 13.0–17.7)
IMMATURE GRANULOCYTES: 0 %
Immature Grans (Abs): 0 10*3/uL (ref 0.0–0.1)
Lymphocytes Absolute: 1.7 10*3/uL (ref 0.7–3.1)
Lymphs: 25 %
MCH: 31 pg (ref 26.6–33.0)
MCHC: 33.3 g/dL (ref 31.5–35.7)
MCV: 93 fL (ref 79–97)
MONOS ABS: 0.6 10*3/uL (ref 0.1–0.9)
Monocytes: 9 %
NEUTROS PCT: 62 %
Neutrophils Absolute: 4.4 10*3/uL (ref 1.4–7.0)
Platelets: 260 10*3/uL (ref 150–450)
RBC: 4.77 x10E6/uL (ref 4.14–5.80)
RDW: 12.1 % (ref 11.6–15.4)
WBC: 6.9 10*3/uL (ref 3.4–10.8)

## 2018-10-20 LAB — COMPREHENSIVE METABOLIC PANEL
ALT: 29 IU/L (ref 0–44)
AST: 30 IU/L (ref 0–40)
Albumin/Globulin Ratio: 1.3 (ref 1.2–2.2)
Albumin: 3.7 g/dL — ABNORMAL LOW (ref 4.1–5.2)
Alkaline Phosphatase: 82 IU/L (ref 39–117)
BUN/Creatinine Ratio: 19 (ref 9–20)
BUN: 25 mg/dL — ABNORMAL HIGH (ref 6–20)
Bilirubin Total: <0.2 mg/dL (ref 0.0–1.2)
CALCIUM: 8.9 mg/dL (ref 8.7–10.2)
CO2: 23 mmol/L (ref 20–29)
CREATININE: 1.32 mg/dL — AB (ref 0.76–1.27)
Chloride: 105 mmol/L (ref 96–106)
GFR calc Af Amer: 85 mL/min/{1.73_m2} (ref >59)
GFR, EST NON AFRICAN AMERICAN: 73 mL/min/{1.73_m2} (ref >59)
Globulin, Total: 2.9 g/dL (ref 1.5–4.5)
Glucose: 64 mg/dL — ABNORMAL LOW (ref 65–99)
Potassium: 4.6 mmol/L (ref 3.5–5.2)
SODIUM: 139 mmol/L (ref 134–144)
Total Protein: 6.6 g/dL (ref 6.0–8.5)

## 2018-10-21 ENCOUNTER — Telehealth: Payer: Self-pay | Admitting: *Deleted

## 2018-10-21 NOTE — Telephone Encounter (Signed)
-----   Message from Suanne Marker, MD sent at 10/20/2018  7:08 PM EST ----- Unremarkable labs. Borderline kidney function. Establish and follow up with PCP. Continue current plan. Please call patient. -VRP

## 2018-10-21 NOTE — Telephone Encounter (Signed)
lmvm for pt to call me back regarding lab results.

## 2018-10-25 ENCOUNTER — Encounter: Payer: Self-pay | Admitting: *Deleted

## 2018-10-25 IMAGING — MR MR HEAD WO/W CM
10 of 14 series · 34 of 48 positions shown · IV contrast (Yes)
Comparison: Head CT 05/08/2017

CLINICAL DATA: Seizures today. History of craniotomy for arachnoid
cyst drainage.

EXAM:
MRI HEAD WITHOUT AND WITH CONTRAST
TECHNIQUE: Multiplanar, multiecho pulse sequences of the brain and surrounding
structures were obtained without and with intravenous contrast.
CONTRAST:  17mL MULTIHANCE GADOBENATE DIMEGLUMINE 529 MG/ML IV SOLN

[Series 3: DWI · axial · 3.0mm · 1.09mm/px · z∈[-100,+42]mm · 9 of 102 slices shown (1 of 4)]
[im 1/102]
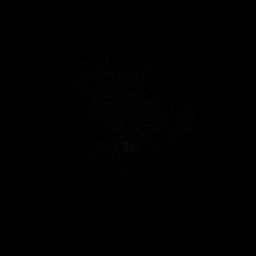
[im 13/102]
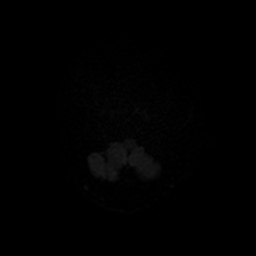
[im 26/102]
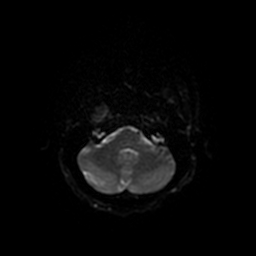
[im 38/102]
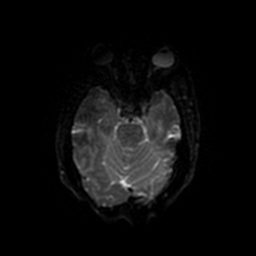
[im 51/102]
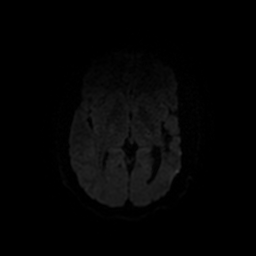
[im 64/102]
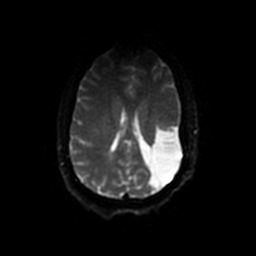
[im 76/102]
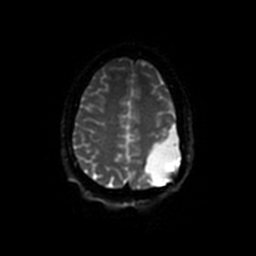
[im 89/102]
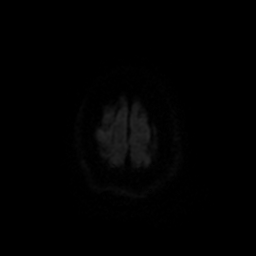
[im 102/102]
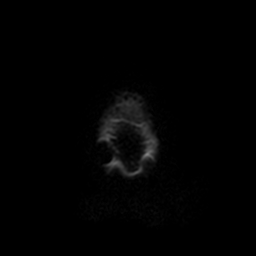

[Series 5: DWI · coronal · 5.0mm · 1.09mm/px · 6 of 76 slices shown (2 of 4)]
[im 1/76]
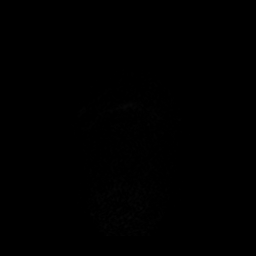
[im 16/76]
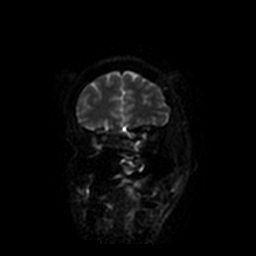
[im 31/76]
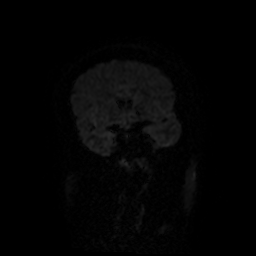
[im 46/76]
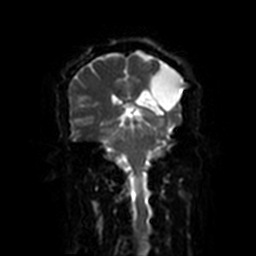
[im 61/76]
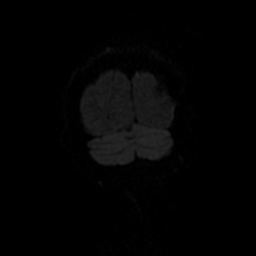
[im 76/76]
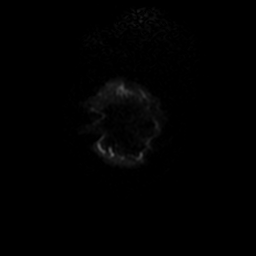

[Series 6: T2 · coronal · 3.0mm · 0.39mm/px · 2 of 28 slices shown (1 of 2)]
[im 1/28]
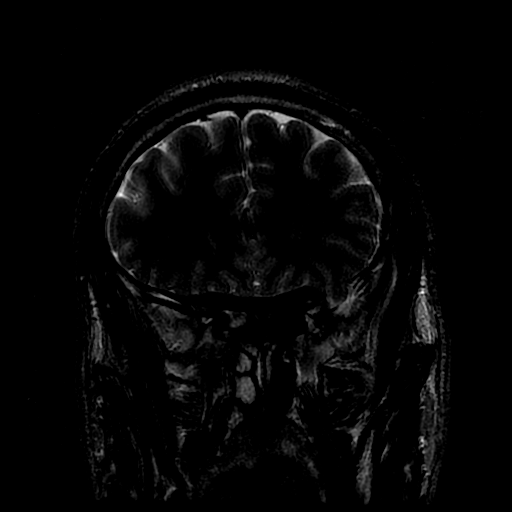
[im 28/28]
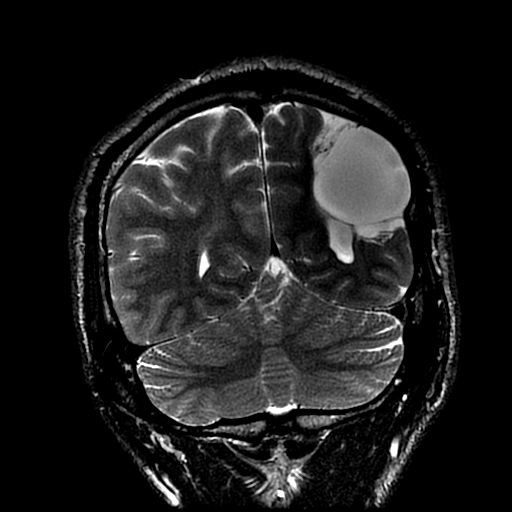

[Series 7: T2 · axial · 5.0mm · 0.43mm/px · z∈[-97,+47]mm · 2 of 23 slices shown (2 of 2)]
[im 1/23]
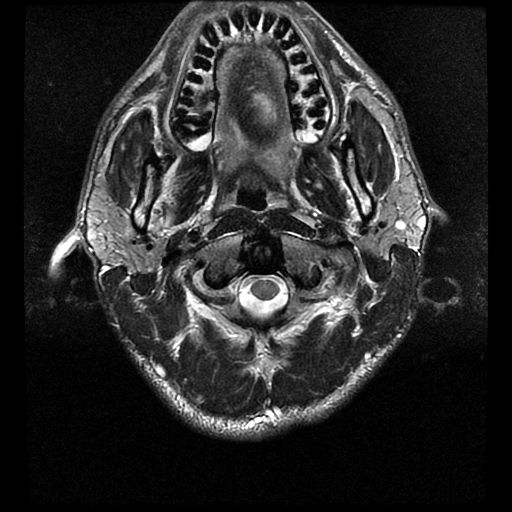
[im 23/23]
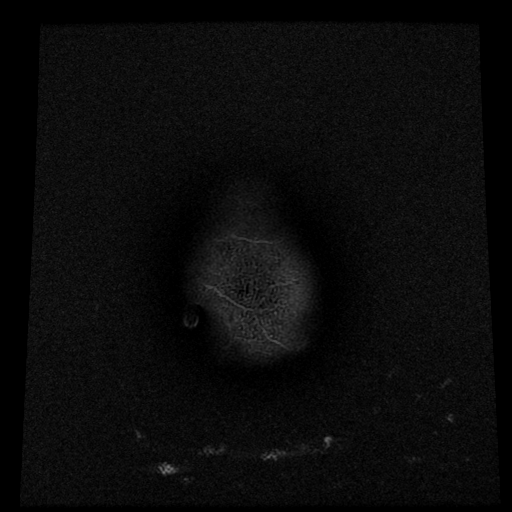

[Series 8: FLAIR · axial · 3.0mm · 0.43mm/px · z∈[-98,+48]mm · 2 of 27 slices shown]
[im 1/27]
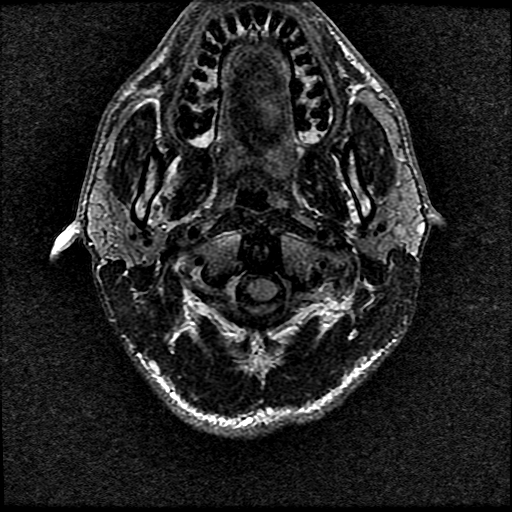
[im 27/27]
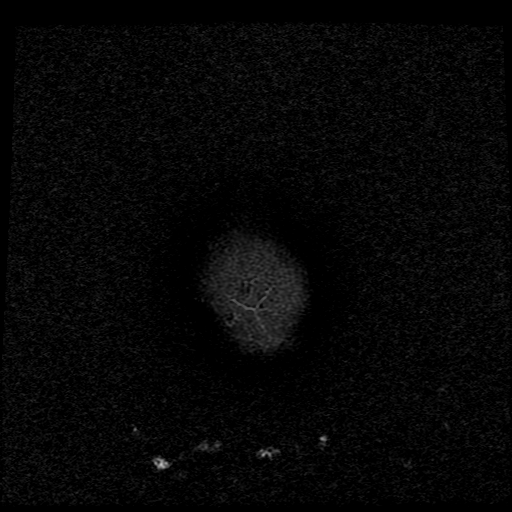

[Series 11: T2 post-contrast · coronal · 5.0mm · 0.45mm/px · 2 of 28 slices shown]
[im 1/28]
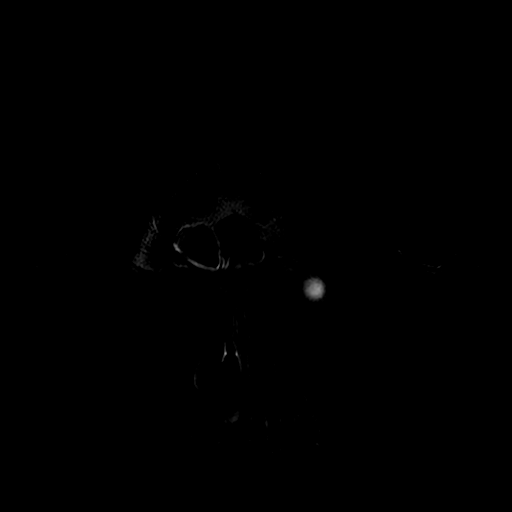
[im 28/28]
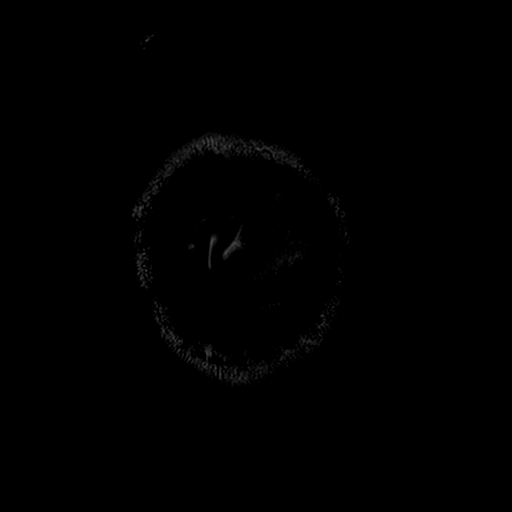

[Series 13: T1 post-contrast · coronal · 5.0mm · 0.45mm/px · 2 of 28 slices shown (1 of 2)]
[im 1/28]
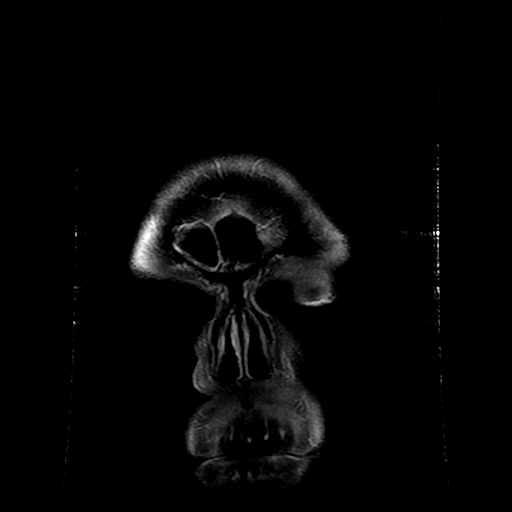
[im 28/28]
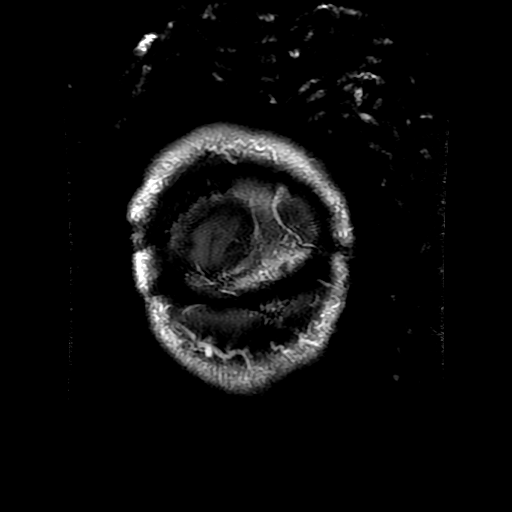

[Series 14: T1 post-contrast · sagittal · 5.0mm · 0.47mm/px · 2 of 24 slices shown (2 of 2)]
[im 1/24]
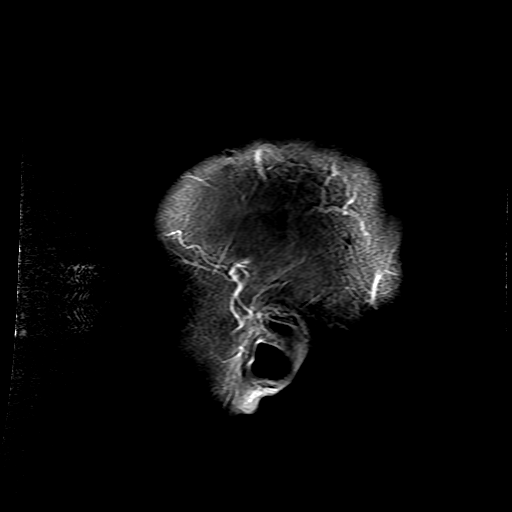
[im 24/24]
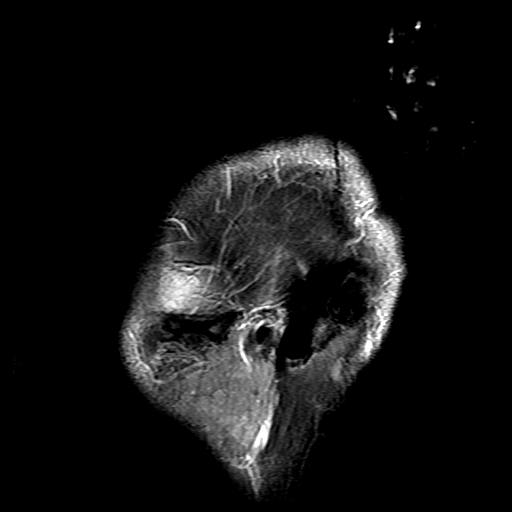

[Series 300: DWI · axial · 3.0mm · 1.09mm/px · z∈[-100,+42]mm · 4 of 51 slices shown (3 of 4)]
[im 1/51]
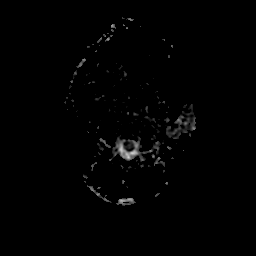
[im 17/51]
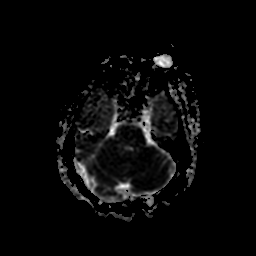
[im 34/51]
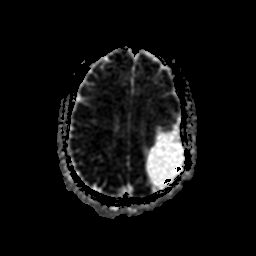
[im 51/51]
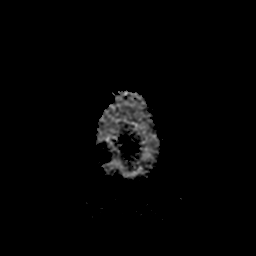

[Series 500: DWI · coronal · 5.0mm · 1.09mm/px · 3 of 38 slices shown (4 of 4)]
[im 1/38]
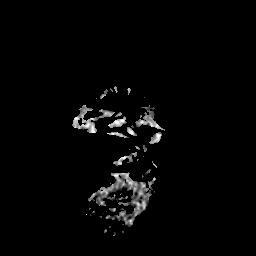
[im 19/38]
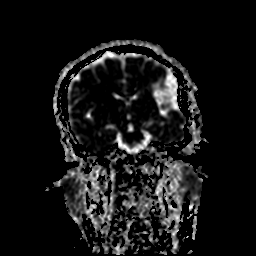
[im 38/38]
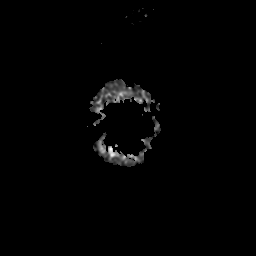

[34 of 48 positions shown; findings below may reference images not displayed]

FINDINGS: Brain: There is no evidence of acute infarct, intracranial
hemorrhage, midline shift, or extra-axial fluid collection. There is
cystic encephalomalacia in the posterior left MCA territory
primarily involving the parietal lobe with milder involvement of the
posterior temporal lobe, insula, and posterior frontal lobe. There
is ex vacuo enlargement of the atrium and occipital horn of the left
lateral ventricle. The dominant cystic region measures 7 x 3.5 cm
and extends to the lateral margin of the atrium of the lateral
ventricle with possible focal area of communication. Overlying left
parietal craniotomy changes are noted. The brain is normal in signal
elsewhere. The right lateral, third, and fourth ventricles are
normal in size. No abnormal enhancement is identified. The mesial
temporal lobe structures are grossly symmetric and normal in
appearance allowing for mild artifact on the dedicated coronal
oblique sequence.

Vascular: Major intracranial vascular flow voids are preserved.

Skull and upper cervical spine: Left parietal craniotomy. No
suspicious marrow lesion.

Sinuses/Orbits: Unremarkable orbits. Mild mucosal thickening in the
right frontal and right greater than left ethmoid sinuses. Clear
mastoid air cells.

Other: None.
IMPRESSION: 1. No acute intracranial abnormality.
2. Moderately large region of cystic encephalomalacia in the
posterior left cerebral hemisphere.

## 2018-10-25 NOTE — Telephone Encounter (Signed)
Spoke to pt and relayed the results (unremarkable labs, kidney FT borderline, recommended establish with pcp and f/u with them per Dr. Marjory Lies.  He did not have pcp, goes to urgent care on  [address], or ED.  I reiterated to establish care with pcp.  He verbalized understanding.   I will send letter to pt with results.  Done.

## 2018-10-25 NOTE — Telephone Encounter (Signed)
-----   Message from Vikram R Penumalli, MD sent at 10/20/2018  7:08 PM EST ----- Unremarkable labs. Borderline kidney function. Establish and follow up with PCP. Continue current plan. Please call patient. -VRP 

## 2018-11-23 MED FILL — levETIRAcetam 750 MG TABS: 750 | 30 days supply | Qty: 120 | Fill #5

## 2018-11-23 MED FILL — TEGRETOL XR 200 MG TABLET: 200 | 30 days supply | Qty: 180 | Fill #4

## 2018-12-10 ENCOUNTER — Encounter (HOSPITAL_COMMUNITY): Payer: Self-pay | Admitting: Emergency Medicine

## 2018-12-10 ENCOUNTER — Other Ambulatory Visit: Payer: Self-pay

## 2018-12-10 ENCOUNTER — Emergency Department (HOSPITAL_COMMUNITY)
Admission: EM | Admit: 2018-12-10 | Discharge: 2018-12-10 | Disposition: A | Discharge status: Home / Self Care | Payer: BLUE CROSS/BLUE SHIELD | Attending: Emergency Medicine | Admitting: Emergency Medicine

## 2018-12-10 DIAGNOSIS — Z7189 Other specified counseling: Secondary | ICD-10-CM

## 2018-12-10 DIAGNOSIS — Z79899 Other long term (current) drug therapy: Secondary | ICD-10-CM | POA: Diagnosis not present

## 2018-12-10 DIAGNOSIS — Z20828 Contact with and (suspected) exposure to other viral communicable diseases: Secondary | ICD-10-CM | POA: Diagnosis not present

## 2018-12-10 DIAGNOSIS — Z720 Tobacco use: Secondary | ICD-10-CM | POA: Insufficient documentation

## 2018-12-10 NOTE — ED Provider Notes (Signed)
[facility]-EMERGENCY DEPT Provider Note   CSN: [id number] Arrival date & time: 12/10/18  1432    History   Chief Complaint Chief Complaint  Patient presents with   works want testing for Covid    HPI Ricky Humphrey is a 28 y.o. male who presents for evaluation of wanting COVID-19 testing.  Patient states that his job sent him for testing.  He states he works at [employer] and a driver was positive for testing.  Patient does not think he has had any contact to the driver.  He states he is only had contact with one other employee at the facility who is still currently working there.  He states that the employer told all the employees to go get tested.  Patient states he is not having any symptoms.  He denies any fever, cough, congestion, difficulty breathing.  He has no history of asthma or COPD.  He occasionally smokes but otherwise denies any smoking history.     The history is provided by the patient.    Past Medical History:  Diagnosis Date   Arachnoid cyst    s/p drainage by craniotomy   Cerebral palsy (HCC)    mild   Epilepsy (HCC)    Headache    Paroxysmal atrial flutter (HCC)    brief, post sz on 09/22/11   Seizures [facility](HCC)     Patient Active Problem List   Diagnosis Date Noted   Seizure (HCC) 05/08/2017   Cerebral palsy (HCC) 06/14/2016   Chronic post-traumatic headache 06/14/2016   Convulsions/seizures (HCC) 06/15/2013   History of atrial flutter 09/23/2011    Past Surgical History:  Procedure Laterality Date   acrnoid cyst     TONSILLECTOMY AND ADENOIDECTOMY          Home Medications    Prior to Admission medications   Medication Sig Start Date End Date Taking? Authorizing Provider  levETIRAcetam (KEPPRA) 750 MG tablet Take 2 tablets (1,500 mg total) by mouth 2 (two) times daily. 10/19/18   Penumalli, Glenford BayleyVikram R, MD  TEGRETOL-XR 200 MG 12 hr tablet Take 3 tablets (600 mg total) by mouth 2 (two) times daily. 10/19/18    Penumalli, Glenford BayleyVikram R, MD    Family History Family History  Problem Relation Age of Onset   Migraines Mother    Diabetes Mother    Asthma Brother     Social History Social History   Tobacco Use   Smoking status: Never Smoker   Smokeless tobacco: Never Used  Substance Use Topics   Alcohol use: No    Alcohol/week: 0.0 standard drinks   Drug use: No     Allergies   Patient has no known allergies.   Review of Systems Review of Systems  Constitutional: Negative for fever.  Respiratory: Negative for cough and shortness of breath.   Cardiovascular: Negative for chest pain.  Gastrointestinal: Negative for abdominal pain, nausea and vomiting.  All other systems reviewed and are negative.    Physical Exam Updated Vital Signs BP (!) 152/83 (BP Location: Right Arm)    Pulse 66    Temp 98.4 F (36.9 C) (Oral)    Resp 20    SpO2 100%   Physical Exam Vitals signs and nursing note reviewed.  Constitutional:      Appearance: Normal appearance. He is well-developed.  HENT:     Head: Normocephalic and atraumatic.  Eyes:     General: Lids are normal.     Conjunctiva/sclera: Conjunctivae normal.  Pupils: Pupils are equal, round, and reactive to light.  Neck:     Musculoskeletal: Full passive range of motion without pain.  Cardiovascular:     Rate and Rhythm: Normal rate and regular rhythm.     Pulses: Normal pulses.     Heart sounds: Normal heart sounds. No murmur. No friction rub. No gallop.   Pulmonary:     Effort: Pulmonary effort is normal.     Breath sounds: Normal breath sounds.     Comments: Lungs clear to auscultation bilaterally.  Symmetric chest rise.  No wheezing, rales, rhonchi.  Able to speak in full sentences without any difficulty breathing. Abdominal:     Palpations: Abdomen is soft. Abdomen is not rigid.     Tenderness: There is no abdominal tenderness. There is no guarding.     Comments: Abdomen is soft, non-distended, non-tender. No rigidity, No  guarding. No peritoneal signs.  Musculoskeletal: Normal range of motion.  Skin:    General: Skin is warm and dry.     Capillary Refill: Capillary refill takes less than 2 seconds.  Neurological:     Mental Status: He is alert and oriented to person, place, and time.  Psychiatric:        Speech: Speech normal.      ED Treatments / Results  Labs (all labs ordered are listed, but only abnormal results are displayed) Labs Reviewed - No data to display  EKG None  Radiology No results found.  Procedures Procedures (including critical care time)  Medications Ordered in ED Medications - No data to display   Initial Impression / Assessment and Plan / ED Course  I have reviewed the triage vital signs and the nursing notes.  Pertinent labs & imaging results that were available during my care of the patient were reviewed by me and considered in my medical decision making (see chart for details).        28 year old male who presents for evaluation of wanting COVID-19 testing.  He states he works at [employer] and that 1 of the driver's tested positive for COVID.  He states that the employer is making everybody get tested for COVID.  He states he has not had any symptoms and denies any fevers, cough, difficulty breathing.  He states he does not think he had exposure to the driver.  Patient with no PCP. Patient is afebrile, non-toxic appearing, sitting comfortably on examination table. Vital signs reviewed and stable.  Lungs clear to auscultation bilaterally.  I discussed with patient that at this time, he does not meet any criteria for COVID-19 testing.  Additionally, do not suspect infectious process given reassuring lung exam.  Instructed patient to self quarantine given possible exposure. At this time, patient exhibits no emergent life-threatening condition that require further evaluation in ED or admission. Patient had ample opportunity for questions and discussion. All patient's questions  were answered with full understanding. Strict return precautions discussed. Patient expresses understanding and agreement to plan.    Ricky Humphrey was evaluated in Emergency Department on 12/10/2018 for the symptoms described in the history of present illness. He was evaluated in the context of the global COVID-19 pandemic, which necessitated consideration that the patient might be at risk for infection with the SARS-CoV-2 virus that causes COVID-19. Institutional protocols and algorithms that pertain to the evaluation of patients at risk for COVID-19 are in a state of rapid change based on information released by regulatory bodies including the CDC and federal and state organizations. These  policies and algorithms were followed during the patient's care in the ED.  Final Clinical Impressions(s) / ED Diagnoses   Final diagnoses:  Advice Given About Covid-19 Virus Infection    ED Discharge Orders    None       Maxwell Caul, PA-C 12/10/18 1606    Shaune Pollack, MD 12/11/18 [phone]

## 2018-12-10 NOTE — ED Triage Notes (Signed)
Pt works for [employer] and had an employee die and another driver positive for Covid, so work made patient get tested even though he doesn't have any symptoms.

## 2018-12-10 NOTE — Discharge Instructions (Signed)
As we discussed, you do not meet criteria for COVID-19 testing.   At this time, if you had exposure to a known COVID-19 contact OR if you are having symptoms such as fever, cough, shortness of breath, you should quarantine yourself.   Follow-up with [facility] to establish a primary care doctor if you do not have one.   Return to emergency department for any breathing, chest pain, fevers or any other worsening concerning symptoms.   Coronavirus (COVID-19) Are you at risk?  Are you at risk for the Coronavirus (COVID-19)?  To be considered HIGH RISK for Coronavirus (COVID-19), you have to meet the following criteria:   Traveled to [place], [place], [place], [place] or [place]; or in the [place] to [place], [place], [place], or [place]; and have fever, cough, and shortness of breath within the last 2 weeks of travel OR  Been in close contact with a person diagnosed with COVID-19 within the last 2 weeks and have fever, cough, and shortness of breath  IF YOU DO NOT MEET THESE CRITERIA, YOU ARE CONSIDERED LOW RISK FOR COVID-19.  What to do if you are HIGH RISK for COVID-19?   If you are having a medical emergency, call 911.  Seek medical care right away. Before you go to a doctors office, urgent care or emergency department, call ahead and tell them about your recent travel, contact with someone diagnosed with COVID-19, and your symptoms. You should receive instructions from your physicians office regarding next steps of care.   When you arrive at healthcare provider, tell the healthcare staff immediately you have returned from visiting [place], [place], [place], [place] or [place]; or traveled in the [place] to [place], [place], [place], or [place]; in the last two weeks or you have been in close contact with a person diagnosed with COVID-19 in the last 2 weeks.    Tell the health care staff about your symptoms: fever, cough and shortness of  breath.  After you have been seen by a medical provider, you will be either: o Tested for (COVID-19) and discharged home on quarantine except to seek medical care if symptoms worsen, and asked to  - Stay home and avoid contact with others until you get your results (4-5 days)  - Avoid travel on public transportation if possible (such as bus, train, or airplane) or o Sent to the Emergency Department by EMS for evaluation, COVID-19 testing, and possible admission depending on your condition and test results.  What to do if you are LOW RISK for COVID-19?  Reduce your risk of any infection by using the same precautions used for avoiding the common cold or flu:   Wash your hands often with soap and warm water for at least 20 seconds.  If soap and water are not readily available, use an alcohol-based hand sanitizer with at least 60% alcohol.   If coughing or sneezing, cover your mouth and nose by coughing or sneezing into the elbow areas of your shirt or coat, into a tissue or into your sleeve (not your hands).  Avoid shaking hands with others and consider head nods or verbal greetings only.  Avoid touching your eyes, nose, or mouth with unwashed hands.   Avoid close contact with people who are sick.  Avoid places or events with large numbers of people in one location, like concerts or sporting events.  Carefully consider travel plans you have or are making.  If you are planning  any travel outside or inside the [place], visit the [facility] webpage for the latest health notices.  If you have some symptoms but not all symptoms, continue to monitor at home and seek medical attention if your symptoms worsen.  If you are having a medical emergency, call 911.   ADDITIONAL HEALTHCARE OPTIONS FOR PATIENTS  [facility] / e-Visit: [email or web address]         [facility]: [phone]  [facility]: [phone]                    [facility]: [phone]

## 2018-12-10 NOTE — ED Notes (Signed)
Bed: WTR8 Expected date:  Expected time:  Means of arrival:  Comments: 

## 2018-12-24 MED FILL — levETIRAcetam 750 MG TABS: 750 | 30 days supply | Qty: 120 | Fill #6

## 2018-12-24 MED FILL — TEGRETOL XR 200 MG TABLET: 200 | 30 days supply | Qty: 180 | Fill #5

## 2019-01-26 ENCOUNTER — Emergency Department (HOSPITAL_COMMUNITY)
Admission: EM | Admit: 2019-01-26 | Discharge: 2019-01-26 | Disposition: A | Discharge status: Home / Self Care | Payer: BC Managed Care – PPO | Attending: Emergency Medicine | Admitting: Emergency Medicine

## 2019-01-26 ENCOUNTER — Other Ambulatory Visit: Payer: Self-pay

## 2019-01-26 ENCOUNTER — Encounter (HOSPITAL_COMMUNITY): Payer: Self-pay

## 2019-01-26 DIAGNOSIS — X58XXXA Exposure to other specified factors, initial encounter: Secondary | ICD-10-CM | POA: Insufficient documentation

## 2019-01-26 DIAGNOSIS — Y939 Activity, unspecified: Secondary | ICD-10-CM | POA: Insufficient documentation

## 2019-01-26 DIAGNOSIS — S01512A Laceration without foreign body of oral cavity, initial encounter: Secondary | ICD-10-CM

## 2019-01-26 DIAGNOSIS — R569 Unspecified convulsions: Secondary | ICD-10-CM | POA: Insufficient documentation

## 2019-01-26 DIAGNOSIS — S00502A Unspecified superficial injury of oral cavity, initial encounter: Secondary | ICD-10-CM | POA: Diagnosis present

## 2019-01-26 DIAGNOSIS — Y929 Unspecified place or not applicable: Secondary | ICD-10-CM | POA: Diagnosis not present

## 2019-01-26 DIAGNOSIS — Z79899 Other long term (current) drug therapy: Secondary | ICD-10-CM | POA: Diagnosis not present

## 2019-01-26 DIAGNOSIS — Y999 Unspecified external cause status: Secondary | ICD-10-CM | POA: Diagnosis not present

## 2019-01-26 HISTORY — DX: Unspecified convulsions: R56.9

## 2019-01-26 LAB — COMPREHENSIVE METABOLIC PANEL
ALT: 35 U/L (ref 0–44)
AST: 45 U/L — ABNORMAL HIGH (ref 15–41)
Albumin: 4.3 g/dL (ref 3.5–5.0)
Alkaline Phosphatase: 69 U/L (ref 38–126)
Anion gap: 6 (ref 5–15)
BUN: 14 mg/dL (ref 6–20)
CO2: 25 mmol/L (ref 22–32)
Calcium: 9.2 mg/dL (ref 8.9–10.3)
Chloride: 109 mmol/L (ref 98–111)
Creatinine, Ser: 1.43 mg/dL — ABNORMAL HIGH (ref 0.61–1.24)
GFR calc Af Amer: >60 mL/min (ref >60)
GFR calc non Af Amer: >60 mL/min (ref >60)
Glucose, Bld: 94 mg/dL (ref 70–99)
Potassium: 4 mmol/L (ref 3.5–5.1)
Sodium: 140 mmol/L (ref 135–145)
Total Bilirubin: 0.5 mg/dL (ref 0.3–1.2)
Total Protein: 7.8 g/dL (ref 6.5–8.1)

## 2019-01-26 LAB — CBC WITH DIFFERENTIAL/PLATELET
Abs Immature Granulocytes: 0.09 10*3/uL — ABNORMAL HIGH (ref 0.00–0.07)
Basophils Absolute: 0 10*3/uL (ref 0.0–0.1)
Basophils Relative: 0 %
Eosinophils Absolute: 0.1 10*3/uL (ref 0.0–0.5)
Eosinophils Relative: 1 %
HCT: 48.8 % (ref 39.0–52.0)
Hemoglobin: 16 g/dL (ref 13.0–17.0)
Immature Granulocytes: 1 %
Lymphocytes Relative: 10 %
Lymphs Abs: 1.2 10*3/uL (ref 0.7–4.0)
MCH: 31.6 pg (ref 26.0–34.0)
MCHC: 32.8 g/dL (ref 30.0–36.0)
MCV: 96.3 fL (ref 80.0–100.0)
Monocytes Absolute: 0.9 10*3/uL (ref 0.1–1.0)
Monocytes Relative: 7 %
Neutro Abs: 10 10*3/uL — ABNORMAL HIGH (ref 1.7–7.7)
Neutrophils Relative %: 81 %
Platelets: 271 10*3/uL (ref 150–400)
RBC: 5.07 MIL/uL (ref 4.22–5.81)
RDW: 12.1 % (ref 11.5–15.5)
WBC: 12.2 10*3/uL — ABNORMAL HIGH (ref 4.0–10.5)
nRBC: 0 % (ref 0.0–0.2)

## 2019-01-26 MED ORDER — LEVETIRACETAM 500 MG PO TABS
1500.0000 mg | ORAL_TABLET | Freq: Two times a day (BID) | ORAL | Status: DC
  Administered 2019-01-26: 1500 mg via ORAL
  Filled 2019-01-26: qty 3

## 2019-01-26 MED ORDER — CARBAMAZEPINE ER 200 MG PO TB12
600.0000 mg | ORAL_TABLET | Freq: Two times a day (BID) | ORAL | Status: DC
  Administered 2019-01-26: 600 mg via ORAL
  Filled 2019-01-26: qty 3

## 2019-01-26 MED ORDER — LACTATED RINGERS IV BOLUS
1000.0000 mL | Freq: Once | INTRAVENOUS | Status: AC
  Administered 2019-01-26: 1000 mL via INTRAVENOUS

## 2019-01-26 MED ORDER — LIDOCAINE VISCOUS HCL 2 % MT SOLN
15.0000 mL | Freq: Once | OROMUCOSAL | Status: AC
  Administered 2019-01-26: 15 mL via OROMUCOSAL
  Filled 2019-01-26: qty 15

## 2019-01-26 NOTE — ED Provider Notes (Signed)
[facility]-EMERGENCY DEPT Provider Note   CSN: [id number] Arrival date & time: 01/26/19  1445    History   Chief Complaint No chief complaint on file.   HPI Ricky Humphrey is a 28 y.o. male.     HPI Patient presents after a witnessed seizure. Patient has a history of seizures, takes 2 medication regularly, Keppra and Tegretol. He notes that he has been taking his medication regularly, with no change in dosage, frequency. Today he had a witnessed seizure. 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 He describes feeling generally sore, without focal pain, denies weakness anywhere, denies confusion, disorientation. Past Medical History:  Diagnosis Date  . Arachnoid cyst    s/p drainage by craniotomy  . Cerebral palsy (HCC)    mild  . Epilepsy (HCC)   . Headache   . Paroxysmal atrial flutter (HCC)    brief, post sz on 09/22/11  . Seizure (HCC)   . Seizures [facility])     Patient Active Problem List   Diagnosis Date Noted  . Seizure (HCC) 05/08/2017  . Cerebral palsy (HCC) 06/14/2016  . 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 Convulsions/seizures (HCC) 06/15/2013  . History of atrial flutter 09/23/2011    Past Surgical History:  Procedure Laterality Date  . acrnoid cyst    . TONSILLECTOMY AND ADENOIDECTOMY          Home Medications    Prior to Admission medications   Medication Sig Start Date End Date Taking? Authorizing Provider  acetaminophen (TYLENOL) 325 MG tablet Take 650 mg by mouth every 6 (six) hours as needed for headache.   Yes [provider]  levETIRAcetam (KEPPRA) 750 MG tablet Take 2 tablets (1,500 mg total) by mouth 2 (two) times daily. 10/19/18  Yes Penumalli, Vikram R, MD  TEGRETOL-XR 200 MG 12 hr tablet Take 3 tablets (600 mg total) by mouth 2 (two) times daily. 10/19/18  Yes Penumalli, Glenford Bayley, MD     Family History Family History  Problem Relation Age of Onset  . Migraines Mother   . Diabetes Mother   . Asthma Brother     Social History Social History   Tobacco Use  . Smoking status: Never Smoker  . Smokeless tobacco: Never Used  Substance Use Topics  . Alcohol use: No    Alcohol/week: 0.0 standard drinks  . Drug use: No     Allergies   Patient has no known allergies.   Review of Systems Review of Systems  Constitutional:       Per HPI, otherwise negative  HENT:       Per HPI, otherwise negative  Respiratory:       Per HPI, otherwise negative  Cardiovascular:       Per HPI, otherwise negative  Gastrointestinal: Negative for vomiting.  Endocrine:       Negative aside from HPI  Genitourinary:       Neg aside from HPI   Musculoskeletal:       Per HPI, otherwise negative  Skin: Negative.   Neurological: Positive for seizures. Negative for syncope.     Physical Exam Updated Vital Signs BP 139/81   Pulse 70   Temp 97.6 F (36.4 C) (Oral)   Resp 14   SpO2 99%   Physical Exam Vitals signs and nursing note reviewed.  Constitutional:  General: He is not in acute distress.    Appearance: He is well-developed.  HENT:     Head: Normocephalic and atraumatic.  Eyes:     Conjunctiva/sclera: Conjunctivae normal.  Cardiovascular:     Rate and Rhythm: Normal rate and regular rhythm.  Pulmonary:     Effort: Pulmonary effort is normal. No respiratory distress.     Breath sounds: No stridor.  Abdominal:     General: There is no distension.  Skin:    General: Skin is warm and dry.  Neurological:     Mental Status: He is alert and oriented to person, place, and time.      ED Treatments / Results  Labs (all labs ordered are listed, but only abnormal results are displayed) Labs Reviewed  COMPREHENSIVE METABOLIC PANEL - Abnormal; Notable for the following components:      Result Value   Creatinine, Ser 1.43 (*)    AST 45 (*)    All other  components within normal limits  CBC WITH DIFFERENTIAL/PLATELET - Abnormal; Notable for the following components:   WBC 12.2 (*)    Neutro Abs 10.0 (*)    Abs Immature Granulocytes 0.09 (*)    All other components within normal limits    EKG EKG Interpretation  Date/Time:  Wednesday January 26 2019 14:58:49 EDT Ventricular Rate:  77 PR Interval:    QRS Duration: 86 QT Interval:  371 QTC Calculation: 420 R Axis:   82 Text Interpretation:  Sinus rhythm LVH by voltage ST elev, probable normal early repol pattern No significant change since last tracing Abnormal ECG Confirmed by Gerhard MunchLockwood, Ching Rabideau [phone] on 01/26/2019 3:13:15 PM   Radiology No results found.  Procedures Procedures (including critical care time)  Medications Ordered in ED Medications  levETIRAcetam (KEPPRA) tablet 1,500 mg (1,500 mg Oral Given 01/26/19 1743)  carbamazepine (TEGRETOL XR) 12 hr tablet 600 mg (600 mg Oral Given 01/26/19 1743)  lactated ringers bolus 1,000 mL (0 mLs Intravenous Stopped 01/26/19 1736)  lidocaine (XYLOCAINE) 2 % viscous mouth solution 15 mL (15 mLs Mouth/Throat Given 01/26/19 1620)  lactated ringers bolus 1,000 mL (1,000 mLs Intravenous Bolus from Bag 01/26/19 1744)     Initial Impression / Assessment and Plan / ED Course  I have reviewed the triage vital signs and the nursing notes.  Pertinent labs & imaging results that were available during my care of the patient were reviewed by me and considered in my medical decision making (see chart for details).       Following initial fluid resuscitation the patient is calm on repeat exam. Creatinine elevated, 1.43, as high as the patient has had a value here, in our system. Patient has no new complaints. With concern for dehydration contributing to his seizure, the patient will receive his home medication, additional fluids, and should he remain unremarkable, will be appropriate for discharge.  6:05 PM Patient in no distress. This young male  presents after a witnessed seizure.  The patient has a tongue laceration, but is not actively bleeding, otherwise reassuring physical exam. Given the patient's findings consistent with dehydration, there is some suspicion for this contributing to his episode today. He received antiepileptics, 2 L fluid resuscitation, had no additional seizure activity was discharged in stable condition.  Final Clinical Impressions(s) / ED Diagnoses   Final diagnoses:  Seizure (HCC)  Laceration of tongue, initial encounter     Gerhard MunchLockwood, Jeanmarc Viernes, MD 01/26/19 1807

## 2019-01-26 NOTE — ED Triage Notes (Signed)
Pt BIB EMS from work. Pt reports seizure at work lasting 1 min witnessed by coworkers. Hx of seizures. Pt reports being compliant with prescribed meds. Alert and oriented x4 with EMS   CBG 116

## 2019-01-26 NOTE — Discharge Instructions (Signed)
As discussed, your evaluation today has been largely reassuring.  But, it is important that you monitor your condition carefully, and do not hesitate to return to the ED if you develop new, or concerning changes in your condition. ? ?Otherwise, please follow-up with your physician for appropriate ongoing care. ? ?

## 2019-01-26 NOTE — ED Notes (Signed)
Bed: YT03 Expected date:  Expected time:  Means of arrival:  Comments: EMS Altered Mental Status

## 2019-02-04 MED FILL — TEGRETOL XR 200 MG TABLET: 200 | 30 days supply | Qty: 180 | Fill #6

## 2019-02-04 MED FILL — levETIRAcetam 750 MG TABS: 750 | 30 days supply | Qty: 120 | Fill #7

## 2019-03-14 MED FILL — levETIRAcetam 750 MG TABS: 750 | 30 days supply | Qty: 120 | Fill #0

## 2019-03-14 MED FILL — TEGRETOL XR 200 MG TABLET: 200 | 30 days supply | Qty: 180 | Fill #7

## 2019-04-14 MED FILL — TEGRETOL XR 200 MG TABLET: 200 | 30 days supply | Qty: 180 | Fill #8

## 2019-04-14 MED FILL — levETIRAcetam 750 MG TABS: 750 | 30 days supply | Qty: 120 | Fill #1

## 2019-05-16 MED FILL — TEGRETOL XR 200 MG TABLET: 200 | 30 days supply | Qty: 180 | Fill #9

## 2019-05-16 MED FILL — levETIRAcetam 750 MG TABS: 750 | 30 days supply | Qty: 120 | Fill #2

## 2019-06-21 MED FILL — levETIRAcetam 750 MG TABS: 750 | 30 days supply | Qty: 120 | Fill #3

## 2019-06-21 MED FILL — TEGRETOL XR 200 MG TABLET: 200 | 30 days supply | Qty: 180 | Fill #10

## 2019-07-22 ENCOUNTER — Other Ambulatory Visit: Payer: Self-pay | Admitting: Diagnostic Neuroimaging

## 2019-07-25 ENCOUNTER — Other Ambulatory Visit: Payer: Self-pay | Admitting: *Deleted

## 2019-07-25 MED ORDER — TEGRETOL-XR 200 MG PO TB12: 600.0000 mg | ORAL_TABLET | Freq: Two times a day (BID) | ORAL | 11 refills | Status: DC

## 2019-07-25 MED FILL — TEGRETOL XR 200 MG TABLET: 200 | 30 days supply | Qty: 180 | Fill #0

## 2019-07-26 HISTORY — PX: HAND SURGERY: SHX662

## 2019-08-01 ENCOUNTER — Other Ambulatory Visit: Payer: Self-pay

## 2019-08-01 ENCOUNTER — Emergency Department (HOSPITAL_COMMUNITY): Payer: BC Managed Care – PPO | Attending: Emergency Medicine

## 2019-08-01 ENCOUNTER — Emergency Department (HOSPITAL_COMMUNITY)
Admission: EM | Admit: 2019-08-01 | Discharge: 2019-08-01 | Disposition: A | Discharge status: Home / Self Care | Payer: BC Managed Care – PPO | Attending: Emergency Medicine | Admitting: Emergency Medicine

## 2019-08-01 ENCOUNTER — Encounter (HOSPITAL_COMMUNITY): Payer: Self-pay

## 2019-08-01 DIAGNOSIS — W3189XA Contact with other specified machinery, initial encounter: Secondary | ICD-10-CM | POA: Diagnosis not present

## 2019-08-01 DIAGNOSIS — Y990 Civilian activity done for income or pay: Secondary | ICD-10-CM | POA: Insufficient documentation

## 2019-08-01 DIAGNOSIS — Z79899 Other long term (current) drug therapy: Secondary | ICD-10-CM | POA: Insufficient documentation

## 2019-08-01 DIAGNOSIS — Y9389 Activity, other specified: Secondary | ICD-10-CM | POA: Diagnosis not present

## 2019-08-01 DIAGNOSIS — S62327A Displaced fracture of shaft of fifth metacarpal bone, left hand, initial encounter for closed fracture: Secondary | ICD-10-CM | POA: Diagnosis not present

## 2019-08-01 DIAGNOSIS — S6992XA Unspecified injury of left wrist, hand and finger(s), initial encounter: Secondary | ICD-10-CM | POA: Diagnosis present

## 2019-08-01 DIAGNOSIS — Y9259 Other trade areas as the place of occurrence of the external cause: Secondary | ICD-10-CM | POA: Diagnosis not present

## 2019-08-01 MED ORDER — IBUPROFEN 200 MG PO TABS
600.0000 mg | ORAL_TABLET | Freq: Once | ORAL | Status: AC
  Administered 2019-08-01: 600 mg via ORAL
  Filled 2019-08-01: qty 3

## 2019-08-01 NOTE — ED Notes (Signed)
An After Visit Summary was printed and given to the patient. Discharge instructions given and no further questions at this time.  

## 2019-08-01 NOTE — Discharge Instructions (Addendum)
There is evidence of a fracture on the x-ray. Pain:  Antiinflammatory medications: Take 600 mg of ibuprofen every 6 hours or 440 mg (over the counter dose) to 500 mg (prescription dose) of naproxen every 12 hours for the next 3 days. After this time, these medications may be used as needed for pain. Take these medications with food to avoid upset stomach. Choose only one of these medications, do not take them together. Acetaminophen (generic for Tylenol): Should you continue to have additional pain while taking the ibuprofen or naproxen, you may add in acetaminophen as needed. Your daily total maximum amount of acetaminophen from all sources should be limited to 4000mg /day for persons without liver problems, or 2000mg /day for those with liver problems. Ice: May apply ice to the injured area for no more than 15 minutes at a time to reduce swelling and pain. Elevation: Keep the extremity elevated whenever possible to reduce swelling and pain. Splint: Keep the splint clean and dry.  Protect it from water during bathing.  If the splint gets wet, you will need to have it reapplied.  Do not leave a wet splint against the skin as this can cause skin breakdown.  Call the orthopedist office or come to the ED for splint replacement, if needed.  Follow-up: Follow-up with the hand specialist for any further management of this issue.  Their office should call you to set up an appointment.  If you have not heard from their office by tomorrow afternoon, please call the number provided. Return: Return to the emergency department for severely increased pain, numbness, blanching of the skin, or any other major concerns.

## 2019-08-01 NOTE — ED Triage Notes (Signed)
Patient reports that he got his left hand caught into a rerun slide at [place]. Patient has left hand swelling and pain.

## 2019-08-01 NOTE — Consult Note (Signed)
ORTHOPAEDIC CONSULTATION HISTORY & PHYSICAL REQUESTING PHYSICIAN: Ricky Norfolk, DO  Chief Complaint: Left hand injury  HPI: Ricky Humphrey is a 28 y.o. male who works as a Merchandiser, retail at [employer] and presents for evaluation of a left hand injury that occurred when his hand struck a portion of the machine reused at work.  He presented to the emergency department with left hand pain, swelling and deformity on the ulnar side of the hand.  Past Medical History:  Diagnosis Date  . Arachnoid cyst    s/p drainage by craniotomy  . Cerebral palsy (HCC)    mild  . Epilepsy (HCC)   . Headache   . Paroxysmal atrial flutter (HCC)    brief, post sz on 09/22/11  . Seizure (HCC)   . Seizures (HCC)    Past Surgical History:  Procedure Laterality Date  . acrnoid cyst    . TONSILLECTOMY AND ADENOIDECTOMY     Social History   Socioeconomic History  . Marital status: Single    Spouse name: girlfriend-Ricky Humphrey  . Number of children: 1  . Years of education: College  . Highest education level: Some college, no degree  Occupational History  . Occupation: Event organiser: [employer]  Social Needs  . Financial resource strain: Not on file  . Food insecurity    Worry: Not on file    Inability: Not on file  . Transportation needs    Medical: Not on file    Non-medical: Not on file  Tobacco Use  . Smoking status: Never Smoker  . Smokeless tobacco: Never Used  Substance and Sexual Activity  . Alcohol use: No    Alcohol/week: 0.0 standard drinks  . Drug use: No  . Sexual activity: Not on file  Lifestyle  . Physical activity    Days per week: Not on file    Minutes per session: Not on file  . Stress: Not on file  Relationships  . Social Musician on phone: Not on file    Gets together: Not on file    Attends religious service: Not on file    Active member of club or organization: Not on file    Attends meetings of clubs or organizations: Not on file    Relationship  status: Not on file  Other Topics Concern  . Not on file  Social History Narrative   Patient lives with a roommate.   First child, daughter born May 2019   College at [employer].    Caffeine Use: 2-3 cups daily   Family History  Problem Relation Age of Onset  . Migraines Mother   . Diabetes Mother   . Asthma Brother    No Known Allergies Prior to Admission medications   Medication Sig Start Date End Date Taking? Authorizing Provider  acetaminophen (TYLENOL) 325 MG tablet Take 650 mg by mouth every 6 (six) hours as needed for headache.    [provider]  levETIRAcetam (KEPPRA) 750 MG tablet Take 2 tablets (1,500 mg total) by mouth 2 (two) times daily. 10/19/18   Penumalli, Glenford Bayley, MD  TEGRETOL-XR 200 MG 12 hr tablet Take 3 tablets (600 mg total) by mouth 2 (two) times daily. 07/25/19   Penumalli, Glenford Bayley, MD   Dg Hand Complete Left  Result Date: 08/01/2019 CLINICAL DATA:  Recent crush injury to the medial hand with pain and swelling, initial encounter EXAM: LEFT HAND - COMPLETE 3+ VIEW COMPARISON:  None. FINDINGS: Comminuted fracture of the  fifth metacarpal is noted with angulation at the fracture site. Mild soft tissue swelling is noted in this region. No other fracture is seen. IMPRESSION: Fifth metacarpal fracture as described. Electronically Signed   By: Inez Catalina M.D.   On: 08/01/2019 15:51    Positive ROS: All other systems have been reviewed and were otherwise negative with the exception of those mentioned in the HPI and as above.  Physical Exam: Vitals: Refer to EMR. Constitutional:  WD, WN, NAD HEENT:  NCAT, EOMI Neuro/Psych:  Alert & oriented to person, place, and time; appropriate mood & affect Lymphatic: No generalized extremity edema or lymphadenopathy Extremities / MSK:  The extremities are normal with respect to appearance, ranges of motion, joint stability, muscle strength/tone, sensation, & perfusion except as otherwise noted:  Left hand swollen and  tender over the midportion of the fifth metacarpal.  Subtle notching of the small finger against the ring finger with flexion.  Obvious apex dorsal deformity of the metacarpal midshaft, NVI  Assessment: Displaced angulated left fifth metacarpal shaft fracture  Plan: I discussed these findings with him.  I reviewed the indications for operative treatment, likely on Friday.  Goals, risk, and options were reviewed and consent was obtained.  He has been placed into an ulnar gutter splint in the emergency department, with an analgesic plan, etc. and instructions for elevation, etc.  My office will contact him tomorrow to gain additional information regarding his Worker's Compensation claim and planning for surgery on Friday.  The operative plan will be to perform closed reduction once appropriate anesthesia is obtained, and then determine whether best to proceed with intramedullary K wire fixation or plate/screw fixation.  [name], [address], [place]  [address] Office: [phone] Mobile: [phone]  08/01/2019, 6:15 PM

## 2019-08-01 NOTE — ED Provider Notes (Signed)
[facility]-EMERGENCY DEPT Provider Note   CSN: [id number] Arrival date & time: 08/01/19  1456     History   Chief Complaint Chief Complaint  Patient presents with  . Hand Injury    HPI BRENDON CHRISTOFFEL is a 28 y.o. male.     HPI   MARC LEICHTER is a 28 y.o. male, with a history of mild cerebral palsy, epilepsy, left hand dominance, presenting to the ED with injury to the left hand that occurred shortly prior to arrival. States he works at [employer] and hit his left hand against one of the conveyor slides.  Pain is in the area of the fifth metacarpal described as a throbbing/pressure, 8-9/10, nonradiating. Accompanied by swelling.   Denies numbness, weakness, pain in the wrist, pain in the fingers, or any other injuries or complaints.   Past Medical History:  Diagnosis Date  . Arachnoid cyst    s/p drainage by craniotomy  . Cerebral palsy (HCC)    mild  . Epilepsy (HCC)   . Headache   . Paroxysmal atrial flutter (HCC)    brief, post sz on 09/22/11  . Seizure (HCC)   . Seizures [facility])     Patient Active Problem List   Diagnosis Date Noted  . Seizure (HCC) 05/08/2017  . Cerebral palsy (HCC) 06/14/2016  . 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 Convulsions/seizures (HCC) 06/15/2013  . History of atrial flutter 09/23/2011    Past Surgical History:  Procedure Laterality Date  . acrnoid cyst    . TONSILLECTOMY AND ADENOIDECTOMY          Home Medications    Prior to Admission medications   Medication Sig Start Date End Date Taking? Authorizing Provider  acetaminophen (TYLENOL) 325 MG tablet Take 650 mg by mouth every 6 (six) hours as needed for headache.    [provider]  levETIRAcetam (KEPPRA) 750 MG tablet Take 2 tablets (1,500 mg total) by mouth 2 (two) times daily. 10/19/18   Penumalli, Glenford Bayley, MD  TEGRETOL-XR 200 MG 12 hr tablet Take 3 tablets (600 mg total) by mouth 2 (two) times daily. 07/25/19   Penumalli,  Glenford Bayley, MD    Family History Family History  Problem Relation Age of Onset  . Migraines Mother   . Diabetes Mother   . Asthma Brother     Social History Social History   Tobacco Use  . Smoking status: Never Smoker  . Smokeless tobacco: Never Used  Substance Use Topics  . Alcohol use: No    Alcohol/week: 0.0 standard drinks  . Drug use: No     Allergies   Patient has no known allergies.   Review of Systems Review of Systems  Musculoskeletal: Positive for arthralgias and joint swelling.  Neurological: Negative for weakness and numbness.     Physical Exam Updated Vital Signs BP (!) 144/90 (BP Location: Right Arm)   Pulse 74   Temp 98.6 F (37 C) (Oral)   Resp 16   Ht 6' 3.5" (1.918 m)   Wt 93 kg   SpO2 99%   BMI 25.28 kg/m   Physical Exam Vitals signs and nursing note reviewed.  Constitutional:      General: He is not in acute distress.    Appearance: He is well-developed. He is not diaphoretic.  HENT:     Head: Normocephalic and atraumatic.  Eyes:     Conjunctiva/sclera: Conjunctivae normal.  Neck:     Musculoskeletal: Neck supple.  Cardiovascular:  Rate and Rhythm: Normal rate and regular rhythm.     Pulses:          Radial pulses are 2+ on the right side.  Pulmonary:     Effort: Pulmonary effort is normal.  Musculoskeletal:     Comments: Swelling in the region of the left fifth metacarpal with associated tenderness.  Swelling obscures any findings of deformity, if present. No tenderness in the MCP joints, fingers, or wrist. No anatomical snuffbox tenderness. Full range of motion in the left wrist and elbow without pain or noted difficulty.  Skin:    General: Skin is warm and dry.     Capillary Refill: Capillary refill takes less than 2 seconds.     Coloration: Skin is not pale.  Neurological:     Mental Status: He is alert.     Comments: Sensation to light touch grossly intact throughout the left hand and fingers. Motor function intact  in the MCP, PIP, and DIP joints of the fingers of the left hand.  Psychiatric:        Behavior: Behavior normal.      ED Treatments / Results  Labs (all labs ordered are listed, but only abnormal results are displayed) Labs Reviewed - No data to display  EKG None  Radiology Dg Hand Complete Left  Result Date: 08/01/2019 CLINICAL DATA:  Recent crush injury to the medial hand with pain and swelling, initial encounter EXAM: LEFT HAND - COMPLETE 3+ VIEW COMPARISON:  None. FINDINGS: Comminuted fracture of the fifth metacarpal is noted with angulation at the fracture site. Mild soft tissue swelling is noted in this region. No other fracture is seen. IMPRESSION: Fifth metacarpal fracture as described. Electronically Signed   By: Alcide CleverMark  Lukens M.D.   On: 08/01/2019 15:51    Procedures .Splint Application  Date/Time: 08/01/2019 6:00 PM Performed by: Anselm PancoastJoy, Fenton Candee C, PA-C Authorized by: Anselm PancoastJoy, Daneille Desilva C, PA-C   Consent:    Consent obtained:  Verbal   Consent given by:  Patient   Risks discussed:  Discoloration, numbness, pain and swelling Pre-procedure details:    Sensation:  Normal   Skin color:  Normal Procedure details:    Laterality:  Left   Location:  Hand   Hand:  L hand   Splint type:  Ulnar gutter   Supplies:  Elastic bandage, Ortho-Glass and cotton padding Post-procedure details:    Pain:  Improved   Sensation:  Normal   Skin color:  Normal   Patient tolerance of procedure:  Tolerated well, no immediate complications Comments:     Procedure was performed by the Ortho Tech with my evaluation before and after. I was available for consultation throughout the procedure.   (including critical care time)  Medications Ordered in ED Medications  ibuprofen (ADVIL) tablet 600 mg (600 mg Oral Given 08/01/19 1733)     Initial Impression / Assessment and Plan / ED Course  I have reviewed the triage vital signs and the nursing notes.  Pertinent labs & imaging results that were  available during my care of the patient were reviewed by me and considered in my medical decision making (see chart for details).  Clinical Course as of Jul 31 1846  Mon Aug 01, 2019  1752 Spoke with Dr. Janee Mornhompson, hand surgeon. Agrees with ulnar gutter and office follow up. His office will call the patient.   [SJ]  1823 Dr. Janee Mornhompson came to see the patient here in the ED.  Previous plan remains in place.   [  SJ]    Clinical Course User Index [SJ] Shyasia Funches C, PA-C       Patient presents with a left hand injury that occurred just prior to arrival.  Pain and swelling noted on exam.  No evidence of neurovascular compromise. Finding of comminuted fifth metacarpal fracture on x-ray. Patient splinted and will follow up with hand specialist in the office. The patient was given instructions for home care as well as return precautions. Patient voices understanding of these instructions, accepts the plan, and is comfortable with discharge.   Final Clinical Impressions(s) / ED Diagnoses   Final diagnoses:  Closed displaced fracture of shaft of fifth metacarpal bone of left hand, initial encounter    ED Discharge Orders    None       Layla Maw 08/01/19 1847    Lennice Sites, DO 08/01/19 2214

## 2019-08-05 MED FILL — oxyCODONE HCL 5 MG TABS: 5 | 7 days supply | Qty: 28 | Fill #0

## 2019-09-05 MED FILL — TEGRETOL XR 200 MG TABLET: 200 | 30 days supply | Qty: 180 | Fill #1

## 2019-09-05 MED FILL — levETIRAcetam 750 MG TABS: 750 | 30 days supply | Qty: 120 | Fill #5

## 2019-10-17 MED FILL — levETIRAcetam 750 MG TABS: 750 | 30 days supply | Qty: 120 | Fill #6

## 2019-10-17 MED FILL — TEGRETOL XR 200 MG TABLET: 200 | 30 days supply | Qty: 180 | Fill #2

## 2019-10-24 ENCOUNTER — Ambulatory Visit: Payer: BLUE CROSS/BLUE SHIELD | Admitting: Diagnostic Neuroimaging

## 2019-11-28 ENCOUNTER — Other Ambulatory Visit: Payer: Self-pay | Admitting: Diagnostic Neuroimaging

## 2019-11-28 MED FILL — TEGRETOL XR 200 MG TABLET: 200 | 30 days supply | Qty: 180 | Fill #0

## 2019-11-28 NOTE — Telephone Encounter (Signed)
Received request to refill levetiracetam. Patient last seen Feb 2020. Called and LVM informing he needs to schedule a FU with MD or NP. After he does we'll refill keppra until he can be seen. Left #.

## 2019-11-30 ENCOUNTER — Encounter: Payer: Self-pay | Admitting: *Deleted

## 2019-11-30 MED FILL — levETIRAcetam 750 MG TABS: 750 | 30 days supply | Qty: 120 | Fill #0

## 2019-11-30 NOTE — Telephone Encounter (Signed)
LVM #2 advising patient he must schedule a FU,, last seen > 1 year ago. I advised he needs refill on Keppra but must be seen or he can get refills with PCP. Left #.

## 2019-11-30 NOTE — Telephone Encounter (Signed)
Sent letter advising patient to schedule a FU with MD or NP soon in order for [place] to continue to manage his seizure medication.

## 2019-12-26 ENCOUNTER — Telehealth: Payer: Self-pay | Admitting: Diagnostic Neuroimaging

## 2019-12-26 NOTE — Telephone Encounter (Signed)
Pt has scheduled his annual f/u, he is asking if he can get a refill on his levETIRAcetam (KEPPRA) 750 MG tablet & TEGRETOL-XR 200 MG 12 hr tablet to  [facility]  until his appointment

## 2019-12-26 NOTE — Telephone Encounter (Signed)
Called patient and informed him both meds have refills on file with [facility], none needed before his 01/16/20 FU. He verbalized understanding, appreciation.

## 2019-12-31 MED FILL — levETIRAcetam 750 MG TABS: 750 | 30 days supply | Qty: 120 | Fill #1

## 2020-01-16 ENCOUNTER — Ambulatory Visit (INDEPENDENT_AMBULATORY_CARE_PROVIDER_SITE_OTHER): Payer: BC Managed Care – PPO | Admitting: Diagnostic Neuroimaging

## 2020-01-16 ENCOUNTER — Other Ambulatory Visit: Payer: Self-pay | Admitting: Diagnostic Neuroimaging

## 2020-01-16 ENCOUNTER — Encounter: Payer: Self-pay | Admitting: Diagnostic Neuroimaging

## 2020-01-16 ENCOUNTER — Other Ambulatory Visit: Payer: Self-pay

## 2020-01-16 VITALS — BP 133/73 | HR 72 | Ht 76.0 in | Wt 200.6 lb

## 2020-01-16 DIAGNOSIS — G40909 Epilepsy, unspecified, not intractable, without status epilepticus: Secondary | ICD-10-CM

## 2020-01-16 MED ORDER — TEGRETOL-XR 200 MG PO TB12: 600.0000 mg | ORAL_TABLET | Freq: Two times a day (BID) | ORAL | 4 refills | Status: DC

## 2020-01-16 MED ORDER — LEVETIRACETAM 750 MG PO TABS: 1500.0000 mg | ORAL_TABLET | Freq: Two times a day (BID) | ORAL | 4 refills | Status: DC

## 2020-01-16 NOTE — Patient Instructions (Signed)
SEIZURE DISRORDER (established problem, improved; last seizure June 2020)  - continue levetiracetam 1500mg  twice a day   - continue carbamazepine XR 600mg  twice a day

## 2020-01-16 NOTE — Progress Notes (Signed)
[facility]  PATIENT: Ricky Humphrey DOB: [birth year]  REFERRING CLINICIAN:  HISTORY FROM: patient REASON FOR VISIT: follow up   HISTORICAL  CHIEF COMPLAINT:  Chief Complaint  Patient presents with   Seizures    rm 7, one year FU, "seizure June 2020, possibly due to work conditions, dehydration"     HISTORY OF PRESENT ILLNESS:   UPDATE (01/16/20, VRP): Since last visit, doing well except June 2020 had witnessed seizure at work, possibly related to dehydration. Now better. No alleviating or aggravating factors. Tolerating meds.    UPDATE (10/19/18, VRP): Since last visit, doing well. No seizures. Tolerating meds. New baby daughter at home (28 months old). No alleviating or aggravating factors.    UPDATE (02/09/18, VRP): Since last visit, doing until 07/14/17 and had another seizure. CBZ level was low (patient may have missed 1 dose) and he also had abscess a few weeks earlier. Tolerating meds. No alleviating or aggravating factors.   UPDATE (05/08/17, VRP): Since last visit, was doing well until 05/08/17 --> had breakthrough seizure at home. Felt some warning when he woke up at 5:30am then had witnessed generalized convulsive seizure with tongue biting; no incontinence. Woke up in the ambulance. Only factor may have been increased stress related to a recent job promotion. Had low CBZ level in ER which was inadjusted with higher dosing. No missed doses of medications. Tolerating new dosing. No alleviating or aggravating factors.   UPDATE 06/13/16: Since last visit, HA are daily. Taking daily ibuprofen. Having interrupted sleep and excessive daytime fatigue. Drinks 1-2 cups coffee in AM, and 1 soda in evening. No seizures.  UPDATE 10/16/15: Since last visit, no seizures. Tolerating meds. Good compliance. Unfortunately, he was at work, bent down to reach for a package, and then struck his head on extender platform from the truck. He had severe pain, but no LOC.  This happened 09/06/15. Went to ER, CT head showed no acute findings. Arachnoid cyst slightly larger than 2013, but no major findings.  UPDATE 04/23/15: Since last visit no sz. Last sz Oct 2014. Tolerating meds without issues. Very good compliance with medications.  UPDATE 07/17/14: Since last visit, doing well and no seizures. Asking about possibility of applying for driver's license. Tolerating medications. No new events of issues.   UPDATE 06/20/13: Since last visit, patient was doing well until breakthrough seizure on 06/15/13. Apparently his pharmacy changed supplier of Carbatrol to a different manufacturer and did not give patient full refill of medication. Patient ended up missing 3 successive doses of Carbatrol and then had multiple back-to-back seizures without return of consciousness. Patient was admitted to the hospital, monitored in the intensive care unit, discharged one day later. Since that time patient has returned to baseline. He still feels a little but sore, especially his tongue. Patient acknowledges missing intermittent doses of medication over the past one year, but usually without any consequence. Patient continues to have problems with insomnia and shifted sleep cycle. He typically goes to sleep at 3 AM and wakes up at noon or 2 PM. He drinks alcohol on social basis.  UPDATE 03/02/12: Doing about the same. No new seizures. Tolerating meds. Asking about driving.   UPDATE 11/19/11: On 09/23/11, pt admitted for breakthrough sz. He had run out of carbatrol, and level was undetectable when checked. Also dx'd with aflutter and has followed up with Dr. Jacinto Halim. No further events.  PRIOR HPI: 29 year old left-handed male with history of mild cerebral palsy, here for evaluation  of seizure disorder. Patient is product of full-term gestation, 18 hour labor, attempted vaginal delivery with foreceps, with emergent c-section.  Patient developed normally from cognitive standpoint. He had mild  right-sided incoordination.  Neuroimaging study demonstrated left brain cyst, and patient was diagnosed with cerebral palsy. Patient had no further problems until age 58 years old, when he was freshman in high school, when he had a new onset generalized convulsive seizure with tongue biting and incontinence at school. He went to the emergency room and a second seizure in the CT scan. He started on Keppra.  He continued to have seizures once per week.  Due to the presence of left brain cyst, he underwent left brain craniotomy, with unsuccessful cyst resection.  Then Carbatrol was added to his regimen. His seizure frequency declined to once every 6-9 months. He had breakthrough seizure in April 2012 with concomitant strep infection. Most recent breakthrough seizure was 2 days ago on 07/27/11. Nowadays typical seizures consist of right arm posturing and convulsions, followed by loss of consciousness.   REVIEW OF SYSTEMS: Full 14 system review of systems performed and negative except: as per HPI.    ALLERGIES: No Known Allergies  HOME MEDICATIONS: Outpatient Medications Prior to Visit  Medication Sig Dispense Refill   levETIRAcetam (KEPPRA) 750 MG tablet TAKE 2 TABLETS BY MOUTH TWICE DAILY 120 tablet 4   TEGRETOL-XR 200 MG 12 hr tablet Take 3 tablets (600 mg total) by mouth 2 (two) times daily. 180 tablet 11   acetaminophen (TYLENOL) 325 MG tablet Take 650 mg by mouth every 6 (six) hours as needed for headache.     No facility-administered medications prior to visit.    PAST MEDICAL HISTORY: Past Medical History:  Diagnosis Date   Arachnoid cyst    s/p drainage by craniotomy   Cerebral palsy (HCC)    mild   Epilepsy (HCC)    Headache    Paroxysmal atrial flutter (HCC)    brief, post sz on 09/22/11   Seizure (HCC)    Seizures (HCC)    last June 2020    PAST SURGICAL HISTORY: Past Surgical History:  Procedure Laterality Date   acrnoid cyst     HAND SURGERY  07/2019    fracture   TONSILLECTOMY AND ADENOIDECTOMY      FAMILY HISTORY: Family History  Problem Relation Age of Onset   Migraines Mother    Diabetes Mother    Asthma Brother     SOCIAL HISTORY:  Social History   Socioeconomic History   Marital status: Single    Spouse name: girlfriend-Amanda   Number of children: 1   Years of education: Automotive engineer   Highest education level: Some college, no degree  Occupational History   Occupation: Event organiser: [employer]  Tobacco Use   Smoking status: Never Smoker   Smokeless tobacco: Never Used  Substance and Sexual Activity   Alcohol use: No    Alcohol/week: 0.0 standard drinks   Drug use: No   Sexual activity: Not on file  Other Topics Concern   Not on file  Social History Narrative   Patient lives with a roommate.   First child, daughter born May 2019   College at [employer].    Caffeine Use: 2-3 cups daily   Social Determinants of Health   Financial Resource Strain:    Difficulty of Paying Living Expenses:   Food Insecurity:    Worried About Programme researcher, broadcasting/film/video in the Last Year:  Ran Out of Food in the Last Year:   Transportation Needs:    Freight forwarder (Medical):    Lack of Transportation (Non-Medical):   Physical Activity:    Days of Exercise per Week:    Minutes of Exercise per Session:   Stress:    Feeling of Stress :   Social Connections:    Frequency of Communication with Friends and Family:    Frequency of Social Gatherings with Friends and Family:    Attends Religious Services:    Active Member of Clubs or Organizations:    Attends Engineer, structural:    Marital Status:   Intimate Partner Violence:    Fear of Current or Ex-Partner:    Emotionally Abused:    Physically Abused:    Sexually Abused:      PHYSICAL EXAM  GENERAL EXAM/CONSTITUTIONAL: Vitals:  Vitals:   01/16/20 1533  BP: 133/73  Pulse: 72  Weight: 200 lb 9.6 oz (91 kg)  Height: 6\' 4"   (1.93 m)   Body mass index is 24.42 kg/m. Wt Readings from Last 10 Encounters:  01/16/20 200 lb 9.6 oz (91 kg)  08/01/19 205 lb (93 kg)  10/19/18 225 lb (102.1 kg)  02/09/18 204 lb 9.6 oz (92.8 kg)  07/04/17 195 lb 3.2 oz (88.5 kg)  07/03/17 195 lb 9.6 oz (88.7 kg)  05/22/17 190 lb 3.2 oz (86.3 kg)  05/08/17 184 lb 11.9 oz (83.8 kg)  11/06/16 203 lb (92.1 kg)  06/16/16 200 lb (90.7 kg)    Patient is in no distress; well developed, nourished and groomed; neck is supple  CARDIOVASCULAR:  Examination of carotid arteries is normal; no carotid bruits  Regular rate and rhythm, no murmurs  Examination of peripheral vascular system by observation and palpation is normal  EYES:  Ophthalmoscopic exam of optic discs and posterior segments is normal; no papilledema or hemorrhages No exam data present  MUSCULOSKELETAL:  Gait, strength, tone, movements noted in Neurologic exam below  NEUROLOGIC: MENTAL STATUS:  No flowsheet data found.  awake, alert, oriented to person, place and time  recent and remote memory intact  normal attention and concentration  language fluent, comprehension intact, naming intact  fund of knowledge appropriate  CRANIAL NERVE:   2nd - no papilledema on fundoscopic exam  2nd, 3rd, 4th, 6th - pupils equal and reactive to light, visual fields full to confrontation, extraocular muscles intact, no nystagmus  5th - facial sensation symmetric  7th - facial strength symmetric  8th - hearing intact  9th - palate elevates symmetrically, uvula midline  11th - shoulder shrug symmetric  12th - tongue protrusion midline  MOTOR:   normal bulk and tone, full strength in the BUE, BLE  SENSORY:   normal and symmetric to light touch  COORDINATION:   finger-nose-finger, fine finger movements normal  REFLEXES:   deep tendon reflexes present and symmetric  GAIT/STATION:   narrow based gait     DIAGNOSTIC DATA (LABS, IMAGING, TESTING) -  I reviewed patient records, labs, notes, testing and imaging myself where available.  Lab Results  Component Value Date   WBC 12.2 (H) 01/26/2019   HGB 16.0 01/26/2019   HCT 48.8 01/26/2019   MCV 96.3 01/26/2019   PLT 271 01/26/2019      Component Value Date/Time   NA 140 01/26/2019 1620   NA 139 10/19/2018 1021   K 4.0 01/26/2019 1620   CL 109 01/26/2019 1620   CO2 25 01/26/2019 1620   GLUCOSE  94 01/26/2019 1620   BUN 14 01/26/2019 1620   BUN 25 (H) 10/19/2018 1021   CREATININE 1.43 (H) 01/26/2019 1620   CALCIUM 9.2 01/26/2019 1620   PROT 7.8 01/26/2019 1620   PROT 6.6 10/19/2018 1021   ALBUMIN 4.3 01/26/2019 1620   ALBUMIN 3.7 (L) 10/19/2018 1021   AST 45 (H) 01/26/2019 1620   ALT 35 01/26/2019 1620   ALKPHOS 69 01/26/2019 1620   BILITOT 0.5 01/26/2019 1620   BILITOT <0.2 10/19/2018 1021   GFRNONAA >60 01/26/2019 1620   GFRAA >60 01/26/2019 1620   No results found for: CHOL Lab Results  Component Value Date   HGBA1C 4.8 06/14/2016   No results found for: [id number] Lab Results  Component Value Date   TSH 0.618 06/13/2016   Lab Results  Component Value Date   PHENYTOIN <2.5 (L) 07/14/2017   VALPROATE <10 (L) 07/14/2017   CBMZ 7.3 02/09/2018   09/22/11 CT head  - Prior left temporal craniotomy. Large CSF attenuation collection extra-axial in left parietal region, consistent with arachnoid cyst. Few linear areas of slightly higher attenuation are seen within the anterior aspect of this collection, question septations, uncertain etiology; consider either comparison to prior outside exams to assess stability or further evaluation by MR imaging with and without contrast to assess.  09/06/15 CT head  - No acute intracranial hemorrhage. Left parietal arachnoid cyst grossly stable or minimally increased compared to the prior study.    ASSESSMENT AND PLAN  29 y.o.  left-handed male with mild cerebral palsy and complex partial seizures with secondary generalization.   Patient was having breakthrough seizures every 6-9 months. Was sz free since Oct 2014 with better dosing of anti-seizure medications, until Sept 2018.   Also with post-traumatic headaches since Sep 06, 2015; CT head and neuro exam unremarkable. Advised conservative mgmt for now.  Last seizures 05/12/17, 07/14/17, June 2020.    Dx:  Seizure disorder [facility])     PLAN:  SEIZURE DISRORDER  - continue levetiracetam 1500mg  twice a day   - continue carbamazepine XR 600mg  twice a day   Meds ordered this encounter  Medications   levETIRAcetam (KEPPRA) 750 MG tablet    Sig: Take 2 tablets (1,500 mg total) by mouth 2 (two) times daily.    Dispense:  360 tablet    Refill:  4   TEGRETOL-XR 200 MG 12 hr tablet    Sig: Take 3 tablets (600 mg total) by mouth 2 (two) times daily.    Dispense:  540 tablet    Refill:  4    BRAND MEDICALLY NECESSARY   Orders Placed This Encounter  Procedures   CBC with Differential/Platelet   Comprehensive metabolic panel   Return in about 1 year (around 01/15/2021).    Penni Bombard, MD 2/99/3716, 9:67 PM Certified in Neurology, Neurophysiology and Neuroimaging  [facility] [address] [place], [place] [address] [phone]

## 2020-01-17 LAB — CBC WITH DIFFERENTIAL/PLATELET
Basophils Absolute: 0.1 10*3/uL (ref 0.0–0.2)
Basos: 1 %
EOS (ABSOLUTE): 0.1 10*3/uL (ref 0.0–0.4)
Eos: 2 %
Hematocrit: 45 % (ref 37.5–51.0)
Hemoglobin: 14.9 g/dL (ref 13.0–17.7)
Immature Grans (Abs): 0 10*3/uL (ref 0.0–0.1)
Immature Granulocytes: 0 %
Lymphocytes Absolute: 2.1 10*3/uL (ref 0.7–3.1)
Lymphs: 36 %
MCH: 31.6 pg (ref 26.6–33.0)
MCHC: 33.1 g/dL (ref 31.5–35.7)
MCV: 96 fL (ref 79–97)
Monocytes Absolute: 0.4 10*3/uL (ref 0.1–0.9)
Monocytes: 7 %
Neutrophils Absolute: 3.1 10*3/uL (ref 1.4–7.0)
Neutrophils: 54 %
Platelets: 253 10*3/uL (ref 150–450)
RBC: 4.71 x10E6/uL (ref 4.14–5.80)
RDW: 12.4 % (ref 11.6–15.4)
WBC: 5.8 10*3/uL (ref 3.4–10.8)

## 2020-01-17 LAB — COMPREHENSIVE METABOLIC PANEL
ALT: 43 IU/L (ref 0–44)
AST: 40 IU/L (ref 0–40)
Albumin/Globulin Ratio: 1.7 (ref 1.2–2.2)
Albumin: 4.7 g/dL (ref 4.1–5.2)
Alkaline Phosphatase: 79 IU/L (ref 48–121)
BUN/Creatinine Ratio: 9 (ref 9–20)
BUN: 11 mg/dL (ref 6–20)
Bilirubin Total: 0.2 mg/dL (ref 0.0–1.2)
CO2: 27 mmol/L (ref 20–29)
Calcium: 9.5 mg/dL (ref 8.7–10.2)
Chloride: 103 mmol/L (ref 96–106)
Creatinine, Ser: 1.28 mg/dL — ABNORMAL HIGH (ref 0.76–1.27)
GFR calc Af Amer: 87 mL/min/{1.73_m2} (ref >59)
GFR calc non Af Amer: 76 mL/min/{1.73_m2} (ref >59)
Globulin, Total: 2.8 g/dL (ref 1.5–4.5)
Glucose: 68 mg/dL (ref 65–99)
Potassium: 4.6 mmol/L (ref 3.5–5.2)
Sodium: 140 mmol/L (ref 134–144)
Total Protein: 7.5 g/dL (ref 6.0–8.5)

## 2020-01-18 ENCOUNTER — Encounter: Payer: Self-pay | Admitting: *Deleted

## 2020-02-07 MED FILL — TEGRETOL XR 200 MG TABLET: 200 | 30 days supply | Qty: 180 | Fill #2

## 2020-02-10 MED FILL — levETIRAcetam 750 MG TABS: 750 | 30 days supply | Qty: 120 | Fill #0

## 2020-03-13 MED FILL — TEGRETOL XR 200 MG TABLET: 200 | 30 days supply | Qty: 180 | Fill #3

## 2020-03-13 MED FILL — levETIRAcetam 750 MG TABS: 750 | 30 days supply | Qty: 120 | Fill #1

## 2020-04-27 MED FILL — levETIRAcetam 750 MG TABS: 750 | 30 days supply | Qty: 120 | Fill #2

## 2020-04-27 MED FILL — TEGRETOL XR 200 MG TABLET: 200 | 30 days supply | Qty: 180 | Fill #4

## 2020-05-05 ENCOUNTER — Emergency Department (HOSPITAL_COMMUNITY)
Admission: EM | Admit: 2020-05-05 | Discharge: 2020-05-05 | Disposition: A | Discharge status: Home / Self Care | Payer: BC Managed Care – PPO | Attending: Emergency Medicine | Admitting: Emergency Medicine

## 2020-05-05 ENCOUNTER — Encounter (HOSPITAL_COMMUNITY): Payer: Self-pay

## 2020-05-05 DIAGNOSIS — Z79899 Other long term (current) drug therapy: Secondary | ICD-10-CM | POA: Insufficient documentation

## 2020-05-05 DIAGNOSIS — Z8669 Personal history of other diseases of the nervous system and sense organs: Secondary | ICD-10-CM | POA: Insufficient documentation

## 2020-05-05 DIAGNOSIS — G40909 Epilepsy, unspecified, not intractable, without status epilepticus: Secondary | ICD-10-CM

## 2020-05-05 DIAGNOSIS — R569 Unspecified convulsions: Secondary | ICD-10-CM | POA: Diagnosis present

## 2020-05-05 LAB — CBC WITH DIFFERENTIAL/PLATELET
Abs Immature Granulocytes: 0.07 10*3/uL (ref 0.00–0.07)
Basophils Absolute: 0.1 10*3/uL (ref 0.0–0.1)
Basophils Relative: 1 %
Eosinophils Absolute: 0.1 10*3/uL (ref 0.0–0.5)
Eosinophils Relative: 1 %
HCT: 40.9 % (ref 39.0–52.0)
Hemoglobin: 13.7 g/dL (ref 13.0–17.0)
Immature Granulocytes: 1 %
Lymphocytes Relative: 9 %
Lymphs Abs: 1 10*3/uL (ref 0.7–4.0)
MCH: 32 pg (ref 26.0–34.0)
MCHC: 33.5 g/dL (ref 30.0–36.0)
MCV: 95.6 fL (ref 80.0–100.0)
Monocytes Absolute: 0.7 10*3/uL (ref 0.1–1.0)
Monocytes Relative: 7 %
Neutro Abs: 8.6 10*3/uL — ABNORMAL HIGH (ref 1.7–7.7)
Neutrophils Relative %: 81 %
Platelets: 205 10*3/uL (ref 150–400)
RBC: 4.28 MIL/uL (ref 4.22–5.81)
RDW: 12.4 % (ref 11.5–15.5)
WBC: 10.5 10*3/uL (ref 4.0–10.5)
nRBC: 0 % (ref 0.0–0.2)

## 2020-05-05 LAB — URINALYSIS, ROUTINE W REFLEX MICROSCOPIC
Bilirubin Urine: NEGATIVE
Glucose, UA: NEGATIVE mg/dL
Ketones, ur: NEGATIVE mg/dL
Leukocytes,Ua: NEGATIVE
Nitrite: NEGATIVE
Protein, ur: 30 mg/dL — AB
Specific Gravity, Urine: 1.014 (ref 1.005–1.030)
pH: 5 (ref 5.0–8.0)

## 2020-05-05 LAB — COMPREHENSIVE METABOLIC PANEL
ALT: 35 U/L (ref 0–44)
AST: 36 U/L (ref 15–41)
Albumin: 3 g/dL — ABNORMAL LOW (ref 3.5–5.0)
Alkaline Phosphatase: 59 U/L (ref 38–126)
Anion gap: 9 (ref 5–15)
BUN: 12 mg/dL (ref 6–20)
CO2: 21 mmol/L — ABNORMAL LOW (ref 22–32)
Calcium: 7.6 mg/dL — ABNORMAL LOW (ref 8.9–10.3)
Chloride: 109 mmol/L (ref 98–111)
Creatinine, Ser: 1.08 mg/dL (ref 0.61–1.24)
GFR calc Af Amer: >60 mL/min (ref >60)
GFR calc non Af Amer: >60 mL/min (ref >60)
Glucose, Bld: 85 mg/dL (ref 70–99)
Potassium: 4.1 mmol/L (ref 3.5–5.1)
Sodium: 139 mmol/L (ref 135–145)
Total Bilirubin: 0.3 mg/dL (ref 0.3–1.2)
Total Protein: 5.5 g/dL — ABNORMAL LOW (ref 6.5–8.1)

## 2020-05-05 LAB — CARBAMAZEPINE LEVEL, TOTAL: Carbamazepine Lvl: 3 ug/mL — ABNORMAL LOW (ref 4.0–12.0)

## 2020-05-05 MED ORDER — SODIUM CHLORIDE 0.9 % IV BOLUS
1000.0000 mL | Freq: Once | INTRAVENOUS | Status: AC
  Administered 2020-05-05: 1000 mL via INTRAVENOUS

## 2020-05-05 MED ORDER — CALCIUM CARBONATE ANTACID 500 MG PO CHEW
2.0000 | CHEWABLE_TABLET | Freq: Once | ORAL | Status: AC
  Administered 2020-05-05: 400 mg via ORAL
  Filled 2020-05-05: qty 2

## 2020-05-05 MED ORDER — KETOROLAC TROMETHAMINE 30 MG/ML IJ SOLN
30.0000 mg | Freq: Once | INTRAMUSCULAR | Status: AC
  Administered 2020-05-05: 30 mg via INTRAVENOUS
  Filled 2020-05-05: qty 1

## 2020-05-05 MED ORDER — LEVETIRACETAM 500 MG PO TABS
750.0000 mg | ORAL_TABLET | Freq: Once | ORAL | Status: AC
  Administered 2020-05-05: 750 mg via ORAL
  Filled 2020-05-05: qty 1

## 2020-05-05 MED ORDER — CARBAMAZEPINE 200 MG PO TABS
600.0000 mg | ORAL_TABLET | Freq: Once | ORAL | Status: AC
  Administered 2020-05-05: 600 mg via ORAL
  Filled 2020-05-05: qty 3

## 2020-05-05 NOTE — ED Notes (Signed)
An After Visit Summary was printed and given to the patient. Discharge instructions given and no further questions at this time.  Pt leaving with girlfriend.  

## 2020-05-05 NOTE — ED Triage Notes (Signed)
Patient presented to Ed with c/o witness seizure this morning at 0500. Patient had two more seizure after the first at 0500 am. Patient in a postictal phase.

## 2020-05-05 NOTE — ED Provider Notes (Signed)
[facility] EMERGENCY DEPARTMENT Provider Note  CSN: [id number] Arrival date & time: 05/05/20 7829    History Chief Complaint  Patient presents with  . Seizures    HPI  Ricky Humphrey is a 29 y.o. male with history of mild cerebral palsy, arachnoid cyst and seizures on Keppra and Tegretol was brought to the ED via EMS from home where significant other noted a seizure around 5am this morning while he was in bed. Per EMS, there were two additional seizures before their arrival. Patient post-ictal on arrival to the ED, but waking up and talking. He denies any current complaints, [place] not [place] very late. He denies any recent missed doses of his meds.    Past Medical History:  Diagnosis Date  . Arachnoid cyst    s/p drainage by craniotomy  . Cerebral palsy (HCC)    mild  . Epilepsy (HCC)   . Headache   . Paroxysmal atrial flutter (HCC)    brief, post sz on 09/22/11  . Seizure (HCC)   . Seizures (HCC)    last June 2020    Past Surgical History:  Procedure Laterality Date  . acrnoid cyst    . HAND SURGERY  07/2019   fracture  . TONSILLECTOMY AND ADENOIDECTOMY      Family History  Problem Relation Age of Onset  . Migraines Mother   . Diabetes Mother   . Asthma Brother     Social History   Tobacco Use  . Smoking status: Never Smoker  . Smokeless tobacco: Never Used  Vaping Use  . Vaping Use: Never used  Substance Use Topics  . Alcohol use: No    Alcohol/week: 0.0 standard drinks  . Drug use: No     Home Medications Prior to Admission medications   Medication Sig Start Date End Date Taking? Authorizing Provider  acetaminophen (TYLENOL) 325 MG tablet Take 650 mg by mouth every 6 (six) hours as needed for headache.   Yes [provider]  levETIRAcetam (KEPPRA) 750 MG tablet Take 2 tablets (1,500 mg total) by mouth 2 (two) times daily. 01/16/20  Yes Penumalli, Vikram R, MD  TEGRETOL-XR 200 MG 12 hr tablet Take 3  tablets (600 mg total) by mouth 2 (two) times daily. 01/16/20  Yes Penumalli, Glenford Bayley, MD     Allergies    Patient has no known allergies.   Review of Systems   Review of Systems A comprehensive review of systems was completed and negative except as noted in HPI.    Physical Exam BP (!) 91/45 (BP Location: Left Arm)   Pulse 79   Temp 98 F (36.7 C) (Oral)   Resp 18   Ht 6\' 4"  (1.93 m)   Wt 87.5 kg   SpO2 98%   BMI 23.49 kg/m   Physical Exam Vitals and nursing note reviewed.  Constitutional:      Appearance: Normal appearance.  HENT:     Head: Normocephalic and atraumatic.     Nose: Nose normal.     Mouth/Throat:     Mouth: Mucous membranes are moist.     Comments: Contusion R tongue Eyes:     Extraocular Movements: Extraocular movements intact.     Conjunctiva/sclera: Conjunctivae normal.  Cardiovascular:     Rate and Rhythm: Normal rate.  Pulmonary:     Effort: Pulmonary effort is normal.     Breath sounds: Normal breath sounds.  Abdominal:  General: Abdomen is flat.     Palpations: Abdomen is soft.     Tenderness: There is no abdominal tenderness.  Musculoskeletal:        General: No swelling. Normal range of motion.     Cervical back: Neck supple.  Skin:    General: Skin is warm and dry.  Neurological:     General: No focal deficit present.     Comments: Sleepy but arousable  Psychiatric:        Mood and Affect: Mood normal.      ED Results / Procedures / Treatments   Labs (all labs ordered are listed, but only abnormal results are displayed) Labs Reviewed  COMPREHENSIVE METABOLIC PANEL - Abnormal; Notable for the following components:      Result Value   CO2 21 (*)    Calcium 7.6 (*)    Total Protein 5.5 (*)    Albumin 3.0 (*)    All other components within normal limits  CBC WITH DIFFERENTIAL/PLATELET - Abnormal; Notable for the following components:   Neutro Abs 8.6 (*)    All other components within normal limits  URINALYSIS,  ROUTINE W REFLEX MICROSCOPIC - Abnormal; Notable for the following components:   Hgb urine dipstick MODERATE (*)    Protein, ur 30 (*)    Bacteria, UA RARE (*)    All other components within normal limits  CARBAMAZEPINE LEVEL, TOTAL    EKG None  Radiology No results found.  Procedures Procedures  Medications Ordered in the ED Medications  sodium chloride 0.9 % bolus 1,000 mL (0 mLs Intravenous Stopped 05/05/20 1017)  calcium carbonate (TUMS - dosed in mg elemental calcium) chewable tablet 400 mg of elemental calcium (400 mg of elemental calcium Oral Given 05/05/20 1017)  levETIRAcetam (KEPPRA) tablet 750 mg (750 mg Oral Given 05/05/20 1122)  carbamazepine (TEGRETOL) tablet 600 mg (600 mg Oral Given 05/05/20 1122)  ketorolac (TORADOL) 30 MG/ML injection 30 mg (30 mg Intravenous Given 05/05/20 1122)     MDM Rules/Calculators/A&P MDM Patient with three witnessed seizures this morning. Reports medication compliance. Will check basic labs including tegretol level, give IVF and monitor in the ED.  ED Course  I have reviewed the triage vital signs and the nursing notes.  Pertinent labs & imaging results that were available during my care of the patient were reviewed by me and considered in my medical decision making (see chart for details).  Clinical Course as of May 05 1125  Sat May 05, 2020  0906 CBC is normal.   [CS]  0910 UA with moderate blood, but no signs of infection.    [CS]  [id number] CMP with mild hypocalcemia, will replete orally.    [CS]  1058 Girlfriend at bedside now confirms above history. He is still sleepy but not post-ictal, awakens and answers questions appropriately. Requesting something for body pain and his morning dose of seizure meds.    [CS]  1124 Tegretol level to be sent to [facility] for analysis. Girlfriend and patient are familiar with his seizure disorder and do no want to stay awaiting that result. Recommend followup with Neurology and RTED for any further  breakthrough seizures.    [CS]    Clinical Course User Index [CS] Pollyann Savoy, MD    Final Clinical Impression(s) / ED Diagnoses Final diagnoses:  Seizure disorder [facility])    Rx / DC Orders ED Discharge Orders    None       Pollyann Savoy, MD 05/05/20 1126

## 2020-06-05 MED FILL — levETIRAcetam 750 MG TABS: 750 | 30 days supply | Qty: 120 | Fill #3

## 2020-06-05 MED FILL — TEGRETOL XR 200 MG TABLET: 200 | 30 days supply | Qty: 180 | Fill #5

## 2020-07-10 MED FILL — TEGRETOL XR 200 MG TABLET: 200 | 30 days supply | Qty: 180 | Fill #6

## 2020-07-10 MED FILL — levETIRAcetam 750 MG TABS: 750 | 30 days supply | Qty: 120 | Fill #4

## 2020-08-13 ENCOUNTER — Other Ambulatory Visit: Payer: Self-pay | Admitting: *Deleted

## 2020-08-13 MED ORDER — TEGRETOL-XR 200 MG PO TB12: 600.0000 mg | ORAL_TABLET | Freq: Two times a day (BID) | ORAL | 1 refills | Status: DC

## 2020-08-13 MED FILL — levETIRAcetam 750 MG TABS: 750 | 30 days supply | Qty: 120 | Fill #5

## 2020-08-13 MED FILL — TEGRETOL XR 200 MG TABLET: 200 | 30 days supply | Qty: 180 | Fill #0

## 2020-08-17 ENCOUNTER — Telehealth: Payer: Self-pay | Admitting: Neurology

## 2020-08-17 MED ORDER — LEVETIRACETAM 750 MG PO TABS
1500.0000 mg | ORAL_TABLET | Freq: Two times a day (BID) | ORAL | 4 refills | Status: DC
Start: 2020-08-17 — End: 2021-01-23

## 2020-08-17 MED ORDER — TEGRETOL-XR 200 MG PO TB12
600.0000 mg | ORAL_TABLET | Freq: Two times a day (BID) | ORAL | 1 refills | Status: DC
Start: 2020-08-17 — End: 2021-01-23

## 2020-08-17 NOTE — Telephone Encounter (Signed)
Received phone call that he needed refills.   Prescriptions sent in

## 2020-10-25 MED FILL — TEGRETOL XR 200 MG TABLET: 200 | 30 days supply | Qty: 180 | Fill #2

## 2020-10-25 MED FILL — levETIRAcetam 750 MG TABS: 750 | 30 days supply | Qty: 120 | Fill #7

## 2020-11-16 ENCOUNTER — Other Ambulatory Visit (HOSPITAL_BASED_OUTPATIENT_CLINIC_OR_DEPARTMENT_OTHER): Payer: Self-pay

## 2020-11-29 ENCOUNTER — Other Ambulatory Visit (HOSPITAL_COMMUNITY): Payer: Self-pay

## 2020-11-30 ENCOUNTER — Other Ambulatory Visit (HOSPITAL_COMMUNITY): Payer: Self-pay

## 2020-12-02 MED FILL — Levetiracetam Tab 750 MG: ORAL | 30 days supply | Qty: 120 | Fill #0 | Status: AC

## 2020-12-02 MED FILL — Carbamazepine Tab ER 12HR 200 MG: ORAL | 30 days supply | Qty: 180 | Fill #0 | Status: AC

## 2020-12-03 ENCOUNTER — Other Ambulatory Visit (HOSPITAL_COMMUNITY): Payer: Self-pay

## 2020-12-05 ENCOUNTER — Other Ambulatory Visit (HOSPITAL_COMMUNITY): Payer: Self-pay

## 2020-12-30 MED FILL — Levetiracetam Tab 750 MG: ORAL | 30 days supply | Qty: 120 | Fill #1 | Status: AC

## 2020-12-30 MED FILL — Carbamazepine Tab ER 12HR 200 MG: ORAL | 30 days supply | Qty: 180 | Fill #1 | Status: AC

## 2020-12-31 ENCOUNTER — Other Ambulatory Visit (HOSPITAL_COMMUNITY): Payer: Self-pay

## 2021-01-01 ENCOUNTER — Other Ambulatory Visit (HOSPITAL_COMMUNITY): Payer: Self-pay

## 2021-01-23 ENCOUNTER — Other Ambulatory Visit (HOSPITAL_COMMUNITY): Payer: Self-pay

## 2021-01-23 ENCOUNTER — Ambulatory Visit (INDEPENDENT_AMBULATORY_CARE_PROVIDER_SITE_OTHER): Payer: BC Managed Care – PPO | Admitting: Diagnostic Neuroimaging

## 2021-01-23 ENCOUNTER — Encounter: Payer: Self-pay | Admitting: Diagnostic Neuroimaging

## 2021-01-23 VITALS — BP 132/81 | HR 70 | Ht 75.5 in | Wt 196.6 lb

## 2021-01-23 DIAGNOSIS — G40909 Epilepsy, unspecified, not intractable, without status epilepticus: Secondary | ICD-10-CM | POA: Diagnosis not present

## 2021-01-23 MED ORDER — TEGRETOL-XR 200 MG PO TB12
600.0000 mg | ORAL_TABLET | Freq: Two times a day (BID) | ORAL | 4 refills | Status: DC
  Filled 2021-01-23: qty 180, 30d supply, fill #0
  Filled 2021-03-18: qty 180, 30d supply, fill #1
  Filled 2021-04-19: qty 180, 30d supply, fill #2
  Filled 2021-05-31: qty 180, 30d supply, fill #3
  Filled 2021-07-05: qty 180, 30d supply, fill #4
  Filled 2021-08-22: qty 180, 30d supply, fill #5
  Filled 2021-10-01: qty 180, 30d supply, fill #6
  Filled 2021-11-05: qty 180, 30d supply, fill #7
  Filled 2021-12-17: qty 180, 30d supply, fill #8
  Filled 2022-01-22: qty 180, 30d supply, fill #9

## 2021-01-23 MED ORDER — LEVETIRACETAM 750 MG PO TABS
1500.0000 mg | ORAL_TABLET | Freq: Two times a day (BID) | ORAL | 4 refills | Status: DC
  Filled 2021-01-23: qty 120, 30d supply, fill #0
  Filled 2021-03-18: qty 120, 30d supply, fill #1
  Filled 2021-04-19: qty 120, 30d supply, fill #2
  Filled 2021-05-31: qty 120, 30d supply, fill #3
  Filled 2021-07-05: qty 120, 30d supply, fill #4
  Filled 2021-08-22: qty 120, 30d supply, fill #5
  Filled 2021-10-01: qty 120, 30d supply, fill #6
  Filled 2021-11-05: qty 120, 30d supply, fill #7
  Filled 2021-12-17: qty 120, 30d supply, fill #8
  Filled 2022-01-22: qty 120, 30d supply, fill #9

## 2021-01-23 NOTE — Progress Notes (Signed)
[facility]  PATIENT: Ricky HalstedChristopher A Humphrey DOB: [birth year]  REFERRING CLINICIAN:  HISTORY FROM: patient REASON FOR VISIT: follow up   HISTORICAL  CHIEF COMPLAINT:  Chief Complaint  Patient presents with  . Seizures    Rm 7 one year FU "doing well"     HISTORY OF PRESENT ILLNESS:   UPDATE (01/23/21, VRP): Since last visit, doing well. Symptoms are stable. No alleviating or aggravating factors. Tolerating meds. Had 2-3 seizures in Sept 2021 (low CBZ level; may have run out of meds). Had COVID in winter 2021, but had mild sxs and now a good recovery.   UPDATE (01/16/20, VRP): Since last visit, doing well except June 2020 had witnessed seizure at work, possibly related to dehydration. Now better. No alleviating or aggravating factors. Tolerating meds.    UPDATE (10/19/18, VRP): Since last visit, doing well. No seizures. Tolerating meds. New baby daughter at home (219 months old). No alleviating or aggravating factors.    UPDATE (02/09/18, VRP): Since last visit, doing until 07/14/17 and had another seizure. CBZ level was low (patient may have missed 1 dose) and he also had abscess a few weeks earlier. Tolerating meds. No alleviating or aggravating factors.   UPDATE (05/08/17, VRP): Since last visit, was doing well until 05/08/17 --> had breakthrough seizure at home. Felt some warning when he woke up at 5:30am then had witnessed generalized convulsive seizure with tongue biting; no incontinence. Woke up in the ambulance. Only factor may have been increased stress related to a recent job promotion. Had low CBZ level in ER which was inadjusted with higher dosing. No missed doses of medications. Tolerating new dosing. No alleviating or aggravating factors.   UPDATE 06/13/16: Since last visit, HA are daily. Taking daily ibuprofen. Having interrupted sleep and excessive daytime fatigue. Drinks 1-2 cups coffee in AM, and 1 soda in evening. No seizures.  UPDATE 10/16/15: Since  last visit, no seizures. Tolerating meds. Good compliance. Unfortunately, he was at work, bent down to reach for a package, and then struck his head on extender platform from the truck. He had severe pain, but no LOC. This happened 09/06/15. Went to ER, CT head showed no acute findings. Arachnoid cyst slightly larger than 2013, but no major findings.  UPDATE 04/23/15: Since last visit no sz. Last sz Oct 2014. Tolerating meds without issues. Very good compliance with medications.  UPDATE 07/17/14: Since last visit, doing well and no seizures. Asking about possibility of applying for driver's license. Tolerating medications. No new events of issues.   UPDATE 06/20/13: Since last visit, patient was doing well until breakthrough seizure on 06/15/13. Apparently his pharmacy changed supplier of Carbatrol to a different manufacturer and did not give patient full refill of medication. Patient ended up missing 3 successive doses of Carbatrol and then had multiple back-to-back seizures without return of consciousness. Patient was admitted to the hospital, monitored in the intensive care unit, discharged one day later. Since that time patient has returned to baseline. He still feels a little but sore, especially his tongue. Patient acknowledges missing intermittent doses of medication over the past one year, but usually without any consequence. Patient continues to have problems with insomnia and shifted sleep cycle. He typically goes to sleep at 3 AM and wakes up at noon or 2 PM. He drinks alcohol on social basis.  UPDATE 03/02/12: Doing about the same. No new seizures. Tolerating meds. Asking about driving.   UPDATE 11/19/11: On 09/23/11, pt admitted for breakthrough  sz. He had run out of carbatrol, and level was undetectable when checked. Also dx'd with aflutter and has followed up with Dr. Jacinto Halim. No further events.  PRIOR HPI: 30 year old left-handed male with history of mild cerebral palsy, here for evaluation of  seizure disorder. Patient is product of full-term gestation, 18 hour labor, attempted vaginal delivery with foreceps, with emergent c-section.  Patient developed normally from cognitive standpoint. He had mild right-sided incoordination.  Neuroimaging study demonstrated left brain cyst, and patient was diagnosed with cerebral palsy. Patient had no further problems until age 36 years old, when he was freshman in high school, when he had a new onset generalized convulsive seizure with tongue biting and incontinence at school. He went to the emergency room and a second seizure in the CT scan. He started on Keppra.  He continued to have seizures once per week.  Due to the presence of left brain cyst, he underwent left brain craniotomy, with unsuccessful cyst resection.  Then Carbatrol was added to his regimen. His seizure frequency declined to once every 6-9 months. He had breakthrough seizure in April 2012 with concomitant strep infection. Most recent breakthrough seizure was 2 days ago on 07/27/11. Nowadays typical seizures consist of right arm posturing and convulsions, followed by loss of consciousness.   REVIEW OF SYSTEMS: Full 14 system review of systems performed and negative except: as per HPI.    ALLERGIES: No Known Allergies  HOME MEDICATIONS: Outpatient Medications Prior to Visit  Medication Sig Dispense Refill  . acetaminophen (TYLENOL) 325 MG tablet Take 650 mg by mouth every 6 (six) hours as needed for headache.    . levETIRAcetam (KEPPRA) 750 MG tablet TAKE 2 TABLETS BY MOUTH TWICE DAILY 360 tablet 4  . TEGRETOL-XR 200 MG 12 hr tablet TAKE 3 TABLETS BY MOUTH TWO TIMES DAILY 540 tablet 4  . levETIRAcetam (KEPPRA) 750 MG tablet Take 2 tablets (1,500 mg total) by mouth 2 (two) times daily. 360 tablet 4  . TEGRETOL-XR 200 MG 12 hr tablet Take 3 tablets (600 mg total) by mouth 2 (two) times daily. 540 tablet 1   No facility-administered medications prior to visit.    PAST MEDICAL  HISTORY: Past Medical History:  Diagnosis Date  . Arachnoid cyst    s/p drainage by craniotomy  . Cerebral palsy (HCC)    mild  . Epilepsy (HCC)   . Headache   . Paroxysmal atrial flutter (HCC)    brief, post sz on 09/22/11  . Seizure (HCC)   . Seizures (HCC)    last June 2020    PAST SURGICAL HISTORY: Past Surgical History:  Procedure Laterality Date  . acrnoid cyst    . HAND SURGERY  07/2019   fracture  . TONSILLECTOMY AND ADENOIDECTOMY      FAMILY HISTORY: Family History  Problem Relation Age of Onset  . Migraines Mother   . Diabetes Mother   . Asthma Brother     SOCIAL HISTORY:  Social History   Socioeconomic History  . Marital status: Married    Spouse name: Marchelle Folks  . Number of children: 1  . Years of education: College  . Highest education level: Some college, no degree  Occupational History  . Occupation: Event organiser: [employer]  Tobacco Use  . Smoking status: Never Smoker  . Smokeless tobacco: Never Used  Vaping Use  . Vaping Use: Never used  Substance and Sexual Activity  . Alcohol use: No    Alcohol/week: 0.0 standard drinks  .  Drug use: No  . Sexual activity: Not on file  Other Topics Concern  . Not on file  Social History Narrative   Patient lives with a roommate.   First child, daughter born May 2019   College at [employer].    Caffeine Use: 2-3 cups daily   Social Determinants of Health   Financial Resource Strain: Not on file  Food Insecurity: Not on file  Transportation Needs: Not on file  Physical Activity: Not on file  Stress: Not on file  Social Connections: Not on file  Intimate Partner Violence: Not on file     PHYSICAL EXAM  GENERAL EXAM/CONSTITUTIONAL: Vitals:  Vitals:   01/23/21 1310  BP: 132/81  Pulse: 70  Weight: 196 lb 9.6 oz (89.2 kg)  Height: 6' 3.5" (1.918 m)   Body mass index is 24.25 kg/m. Wt Readings from Last 10 Encounters:  01/23/21 196 lb 9.6 oz (89.2 kg)  05/05/20 193 lb (87.5 kg)  01/16/20  200 lb 9.6 oz (91 kg)  08/01/19 205 lb (93 kg)  10/19/18 225 lb (102.1 kg)  02/09/18 204 lb 9.6 oz (92.8 kg)  07/04/17 195 lb 3.2 oz (88.5 kg)  07/03/17 195 lb 9.6 oz (88.7 kg)  05/22/17 190 lb 3.2 oz (86.3 kg)  05/08/17 184 lb 11.9 oz (83.8 kg)    Patient is in no distress; well developed, nourished and groomed; neck is supple  CARDIOVASCULAR:  Examination of carotid arteries is normal; no carotid bruits  Regular rate and rhythm, no murmurs  Examination of peripheral vascular system by observation and palpation is normal  EYES:  Ophthalmoscopic exam of optic discs and posterior segments is normal; no papilledema or hemorrhages No exam data present  MUSCULOSKELETAL:  Gait, strength, tone, movements noted in Neurologic exam below  NEUROLOGIC: MENTAL STATUS:  No flowsheet data found.  awake, alert, oriented to person, place and time  recent and remote memory intact  normal attention and concentration  language fluent, comprehension intact, naming intact  fund of knowledge appropriate  CRANIAL NERVE:   2nd - no papilledema on fundoscopic exam  2nd, 3rd, 4th, 6th - pupils equal and reactive to light, visual fields full to confrontation, extraocular muscles intact, no nystagmus  5th - facial sensation symmetric  7th - facial strength symmetric  8th - hearing intact  9th - palate elevates symmetrically, uvula midline  11th - shoulder shrug symmetric  12th - tongue protrusion midline  MOTOR:   normal bulk and tone, full strength in the BUE, BLE  SENSORY:   normal and symmetric to light touch  COORDINATION:   finger-nose-finger, fine finger movements normal  REFLEXES:   deep tendon reflexes present and symmetric  GAIT/STATION:   narrow based gait     DIAGNOSTIC DATA (LABS, IMAGING, TESTING) - I reviewed patient records, labs, notes, testing and imaging myself where available.  Lab Results  Component Value Date   WBC 10.5 05/05/2020    HGB 13.7 05/05/2020   HCT 40.9 05/05/2020   MCV 95.6 05/05/2020   PLT 205 05/05/2020      Component Value Date/Time   NA 139 05/05/2020 0830   NA 140 01/16/2020 1621   K 4.1 05/05/2020 0830   CL 109 05/05/2020 0830   CO2 21 (L) 05/05/2020 0830   GLUCOSE 85 05/05/2020 0830   BUN 12 05/05/2020 0830   BUN 11 01/16/2020 1621   CREATININE 1.08 05/05/2020 0830   CALCIUM 7.6 (L) 05/05/2020 0830   PROT 5.5 (L) 05/05/2020 0830  PROT 7.5 01/16/2020 1621   ALBUMIN 3.0 (L) 05/05/2020 0830   ALBUMIN 4.7 01/16/2020 1621   AST 36 05/05/2020 0830   ALT 35 05/05/2020 0830   ALKPHOS 59 05/05/2020 0830   BILITOT 0.3 05/05/2020 0830   BILITOT 0.2 01/16/2020 1621   GFRNONAA >60 05/05/2020 0830   GFRAA >60 05/05/2020 0830   No results found for: CHOL Lab Results  Component Value Date   HGBA1C 4.8 06/14/2016   No results found for: [id number] Lab Results  Component Value Date   TSH 0.618 06/13/2016   Lab Results  Component Value Date   PHENYTOIN <2.5 (L) 07/14/2017   VALPROATE <10 (L) 07/14/2017   CBMZ 3.0 (L) 05/05/2020   09/22/11 CT head  - Prior left temporal craniotomy. Large CSF attenuation collection extra-axial in left parietal region, consistent with arachnoid cyst. Few linear areas of slightly higher attenuation are seen within the anterior aspect of this collection, question septations, uncertain etiology; consider either comparison to prior outside exams to assess stability or further evaluation by MR imaging with and without contrast to assess.  09/06/15 CT head  - No acute intracranial hemorrhage. Left parietal arachnoid cyst grossly stable or minimally increased compared to the prior study.    ASSESSMENT AND PLAN  30 y.o.  left-handed male with mild cerebral palsy and complex partial seizures with secondary generalization.  Patient was having breakthrough seizures every 6-9 months. Was sz free since Oct 2014 with better dosing of anti-seizure medications, until Sept  2018.   Also with post-traumatic headaches since Sep 06, 2015; CT head and neuro exam unremarkable. Advised conservative mgmt for now.  Last seizures 05/12/17, 07/14/17, June 2020, 05/05/20   Dx:  Seizure disorder [facility])     PLAN:  SEIZURE DISRORDER - continue levetiracetam 1500mg  twice a day  - continue carbamazepine XR 600mg  twice a day - check labs  Orders Placed This Encounter  Procedures  . CBC with Differential/Platelet  . Comprehensive metabolic panel  . Carbamazepine level, total   Meds ordered this encounter  Medications  . levETIRAcetam (KEPPRA) 750 MG tablet    Sig: Take 2 tablets (1,500 mg total) by mouth 2 (two) times daily.    Dispense:  360 tablet    Refill:  4  . TEGRETOL-XR 200 MG 12 hr tablet    Sig: Take 3 tablets (600 mg total) by mouth 2 (two) times daily.    Dispense:  540 tablet    Refill:  4   Return in about 1 year (around 01/23/2022).    , MD 01/23/2021, 1:33 PM Certified in Neurology, Neurophysiology and Neuroimaging  [facility] [address], [address] [place], [address] [place] [phone]

## 2021-01-24 ENCOUNTER — Other Ambulatory Visit (HOSPITAL_COMMUNITY): Payer: Self-pay

## 2021-01-24 LAB — COMPREHENSIVE METABOLIC PANEL
ALT: 36 IU/L (ref 0–44)
AST: 32 IU/L (ref 0–40)
Albumin/Globulin Ratio: 2 (ref 1.2–2.2)
Albumin: 4.3 g/dL (ref 4.1–5.2)
Alkaline Phosphatase: 63 IU/L (ref 44–121)
BUN/Creatinine Ratio: 9 (ref 9–20)
BUN: 11 mg/dL (ref 6–20)
Bilirubin Total: 0.3 mg/dL (ref 0.0–1.2)
CO2: 23 mmol/L (ref 20–29)
Calcium: 9 mg/dL (ref 8.7–10.2)
Chloride: 105 mmol/L (ref 96–106)
Creatinine, Ser: 1.22 mg/dL (ref 0.76–1.27)
Globulin, Total: 2.1 g/dL (ref 1.5–4.5)
Glucose: 87 mg/dL (ref 65–99)
Potassium: 4.5 mmol/L (ref 3.5–5.2)
Sodium: 140 mmol/L (ref 134–144)
Total Protein: 6.4 g/dL (ref 6.0–8.5)
eGFR: 82 mL/min/{1.73_m2} (ref >59)

## 2021-01-24 LAB — CBC WITH DIFFERENTIAL/PLATELET
Basophils Absolute: 0 10*3/uL (ref 0.0–0.2)
Basos: 1 %
EOS (ABSOLUTE): 0.1 10*3/uL (ref 0.0–0.4)
Eos: 2 %
Hematocrit: 42.5 % (ref 37.5–51.0)
Hemoglobin: 14.5 g/dL (ref 13.0–17.7)
Immature Grans (Abs): 0 10*3/uL (ref 0.0–0.1)
Immature Granulocytes: 0 %
Lymphocytes Absolute: 1.5 10*3/uL (ref 0.7–3.1)
Lymphs: 27 %
MCH: 32.2 pg (ref 26.6–33.0)
MCHC: 34.1 g/dL (ref 31.5–35.7)
MCV: 94 fL (ref 79–97)
Monocytes Absolute: 0.4 10*3/uL (ref 0.1–0.9)
Monocytes: 8 %
Neutrophils Absolute: 3.5 10*3/uL (ref 1.4–7.0)
Neutrophils: 62 %
Platelets: 242 10*3/uL (ref 150–450)
RBC: 4.5 x10E6/uL (ref 4.14–5.80)
RDW: 12.7 % (ref 11.6–15.4)
WBC: 5.6 10*3/uL (ref 3.4–10.8)

## 2021-01-24 LAB — CARBAMAZEPINE LEVEL, TOTAL: Carbamazepine (Tegretol), S: 5.7 ug/mL (ref 4.0–12.0)

## 2021-01-25 ENCOUNTER — Other Ambulatory Visit (HOSPITAL_COMMUNITY): Payer: Self-pay

## 2021-01-29 ENCOUNTER — Encounter: Payer: Self-pay | Admitting: *Deleted

## 2021-02-01 ENCOUNTER — Other Ambulatory Visit (HOSPITAL_COMMUNITY): Payer: Self-pay

## 2021-02-01 MED ORDER — METHOCARBAMOL 750 MG PO TABS
ORAL_TABLET | ORAL | 0 refills | Status: DC
Start: 2021-02-01 — End: 2022-08-15
  Filled 2021-02-01: qty 60, 20d supply, fill #0

## 2021-03-15 ENCOUNTER — Other Ambulatory Visit (HOSPITAL_COMMUNITY): Payer: Self-pay

## 2021-03-18 ENCOUNTER — Other Ambulatory Visit (HOSPITAL_COMMUNITY): Payer: Self-pay

## 2021-03-19 ENCOUNTER — Other Ambulatory Visit (HOSPITAL_COMMUNITY): Payer: Self-pay

## 2021-03-23 ENCOUNTER — Other Ambulatory Visit (HOSPITAL_COMMUNITY): Payer: Self-pay

## 2021-04-19 ENCOUNTER — Other Ambulatory Visit (HOSPITAL_COMMUNITY): Payer: Self-pay

## 2021-04-22 ENCOUNTER — Other Ambulatory Visit (HOSPITAL_COMMUNITY): Payer: Self-pay

## 2021-05-31 ENCOUNTER — Other Ambulatory Visit (HOSPITAL_COMMUNITY): Payer: Self-pay

## 2021-06-03 ENCOUNTER — Other Ambulatory Visit (HOSPITAL_COMMUNITY): Payer: Self-pay

## 2021-07-05 ENCOUNTER — Other Ambulatory Visit (HOSPITAL_COMMUNITY): Payer: Self-pay

## 2021-07-08 ENCOUNTER — Other Ambulatory Visit (HOSPITAL_COMMUNITY): Payer: Self-pay

## 2021-08-22 ENCOUNTER — Other Ambulatory Visit (HOSPITAL_COMMUNITY): Payer: Self-pay

## 2021-08-23 ENCOUNTER — Other Ambulatory Visit (HOSPITAL_COMMUNITY): Payer: Self-pay

## 2021-10-01 ENCOUNTER — Other Ambulatory Visit (HOSPITAL_COMMUNITY): Payer: Self-pay

## 2021-10-02 ENCOUNTER — Other Ambulatory Visit (HOSPITAL_COMMUNITY): Payer: Self-pay

## 2021-11-05 ENCOUNTER — Other Ambulatory Visit (HOSPITAL_COMMUNITY): Payer: Self-pay

## 2021-11-06 ENCOUNTER — Other Ambulatory Visit (HOSPITAL_COMMUNITY): Payer: Self-pay

## 2021-12-17 ENCOUNTER — Other Ambulatory Visit (HOSPITAL_COMMUNITY): Payer: Self-pay

## 2021-12-18 ENCOUNTER — Other Ambulatory Visit (HOSPITAL_COMMUNITY): Payer: Self-pay

## 2022-01-22 ENCOUNTER — Telehealth (INDEPENDENT_AMBULATORY_CARE_PROVIDER_SITE_OTHER): Payer: Medicaid Other | Admitting: Diagnostic Neuroimaging

## 2022-01-22 ENCOUNTER — Encounter: Payer: Self-pay | Admitting: Diagnostic Neuroimaging

## 2022-01-22 ENCOUNTER — Other Ambulatory Visit (HOSPITAL_COMMUNITY): Payer: Self-pay

## 2022-01-22 ENCOUNTER — Telehealth: Payer: Self-pay | Admitting: Diagnostic Neuroimaging

## 2022-01-22 DIAGNOSIS — G40909 Epilepsy, unspecified, not intractable, without status epilepticus: Secondary | ICD-10-CM

## 2022-01-22 MED ORDER — TEGRETOL-XR 200 MG PO TB12
600.0000 mg | ORAL_TABLET | Freq: Two times a day (BID) | ORAL | 4 refills | Status: DC
  Filled 2022-01-22: qty 540, 90d supply, fill #0

## 2022-01-22 MED ORDER — LEVETIRACETAM 750 MG PO TABS
1500.0000 mg | ORAL_TABLET | Freq: Two times a day (BID) | ORAL | 4 refills | Status: DC
  Filled 2022-01-22 – 2022-03-24 (×2): qty 360, 90d supply, fill #0
  Filled 2022-05-06: qty 360, 90d supply, fill #1

## 2022-01-22 NOTE — Progress Notes (Signed)
    Virtual Visit via Video Note  I connected with Ricky Humphrey on 01/22/22 at 10:30 AM EDT by a video enabled telemedicine application and verified that I am speaking with the correct person using two identifiers.   I discussed the limitations of evaluation and management by telemedicine and the availability of in person appointments. The patient expressed understanding and agreed to proceed.  Patient is at their home. I am at the office.    History of Present Illness:   UPDATE (01/22/22, VRP): Since last visit, doing well since last year; no seizures. Recently dx'd with rotavirus, and having some nausea / vomiting.   UPDATE (01/23/21, VRP): Since last visit, doing well. Symptoms are stable. No alleviating or aggravating factors. Tolerating meds. Had 2-3 seizures in Sept 2021 (low CBZ level; may have run out of meds). Had COVID in winter 2021, but had mild sxs and now a good recovery.     Observations/Objective:  Video visit   Assessment and Plan:  Dx:  1. Seizure disorder (HCC)      SEIZURE DISRORDER - continue levetiracetam 1500mg  twice a day  - continue carbamazepine XR 600mg  twice a day - check labs  Meds ordered this encounter  Medications   TEGRETOL-XR 200 MG 12 hr tablet    Sig: Take 3 tablets (600 mg total) by mouth 2 (two) times daily.    Dispense:  540 tablet    Refill:  4   levETIRAcetam (KEPPRA) 750 MG tablet    Sig: Take 2 tablets (1,500 mg total) by mouth 2 (two) times daily.    Dispense:  360 tablet    Refill:  4      Follow Up Instructions:  - Return in about 1 year (around 01/23/2023) for MyChart visit (15 min).    I discussed the assessment and treatment plan with the patient. The patient was provided an opportunity to ask questions and all were answered. The patient agreed with the plan and demonstrated an understanding of the instructions.   The patient was advised to call back or seek an in-person evaluation if the symptoms worsen  or if the condition fails to improve as anticipated.  I provided 15 minutes of non-face-to-face time during this encounter.   , MD 01/22/2022, 10:37 AM Certified in Neurology, Neurophysiology and Neuroimaging  [facility] [address], [address] [place], [address] [place] [phone]

## 2022-01-22 NOTE — Telephone Encounter (Addendum)
Pt states he has rota virus A.  He has been taking his seizure medication but has been vomiting a lot and is not sure how much of his seizure medication is in his system.  Pt states he can not afford to have a seizure as he is taking care of his 30 yr old daughter.  Please call pt to discuss concerns about seizure medication. Pt was told this message would be sent with high priority.  Pt is asking for a call from Dr Marjory Lies.

## 2022-01-22 NOTE — Telephone Encounter (Signed)
Due to pt's yearly f/u being needed next month and pt's preference on speaking with MD video visit for today at 1030 has been scheduled with Dr. Marjory Lies.

## 2022-01-25 ENCOUNTER — Other Ambulatory Visit (HOSPITAL_COMMUNITY): Payer: Self-pay

## 2022-01-25 ENCOUNTER — Telehealth: Payer: Self-pay | Admitting: Neurology

## 2022-01-25 MED ORDER — TEGRETOL-XR 200 MG PO TB12
600.0000 mg | ORAL_TABLET | Freq: Two times a day (BID) | ORAL | 4 refills | Status: DC
  Filled 2022-01-25: qty 540, 90d supply, fill #0
  Filled 2022-05-06: qty 540, 90d supply, fill #1

## 2022-01-25 NOTE — Telephone Encounter (Signed)
Got a message from answering service "Missing half of his Rx order.  Urgent per caller since he has Seizures"  I called and got VM .   Left a message at 3 pm   Was able to reach patient 3:10  You sent in prescriptions for carbamazepine XR and levetiracetam 01/22/2022 but he only received the levetiracetam.  I sent in a new prescription for the carbamazepine with an updated address that he provided ([address], [place], [place] [address])

## 2022-01-27 ENCOUNTER — Other Ambulatory Visit (HOSPITAL_COMMUNITY): Payer: Self-pay

## 2022-01-28 ENCOUNTER — Other Ambulatory Visit (HOSPITAL_COMMUNITY): Payer: Self-pay

## 2022-01-29 ENCOUNTER — Ambulatory Visit: Payer: BC Managed Care – PPO | Admitting: Diagnostic Neuroimaging

## 2022-02-11 ENCOUNTER — Ambulatory Visit: Payer: Medicaid Other | Admitting: Diagnostic Neuroimaging

## 2022-03-24 ENCOUNTER — Other Ambulatory Visit (HOSPITAL_COMMUNITY): Payer: Self-pay

## 2022-04-17 ENCOUNTER — Emergency Department (HOSPITAL_COMMUNITY)
Admission: EM | Admit: 2022-04-17 | Discharge: 2022-04-17 | Disposition: A | Discharge status: Home / Self Care | Payer: Medicaid Other | Attending: Emergency Medicine | Admitting: Emergency Medicine

## 2022-04-17 ENCOUNTER — Emergency Department (HOSPITAL_COMMUNITY): Payer: Medicaid Other

## 2022-04-17 ENCOUNTER — Encounter (HOSPITAL_COMMUNITY): Payer: Self-pay | Admitting: Emergency Medicine

## 2022-04-17 DIAGNOSIS — G40909 Epilepsy, unspecified, not intractable, without status epilepticus: Secondary | ICD-10-CM | POA: Diagnosis not present

## 2022-04-17 DIAGNOSIS — R519 Headache, unspecified: Secondary | ICD-10-CM | POA: Diagnosis not present

## 2022-04-17 DIAGNOSIS — R569 Unspecified convulsions: Secondary | ICD-10-CM | POA: Diagnosis present

## 2022-04-17 LAB — COMPREHENSIVE METABOLIC PANEL
ALT: 39 U/L (ref 0–44)
AST: 38 U/L (ref 15–41)
Albumin: 3.6 g/dL (ref 3.5–5.0)
Alkaline Phosphatase: 61 U/L (ref 38–126)
Anion gap: 11 (ref 5–15)
BUN: 10 mg/dL (ref 6–20)
CO2: 19 mmol/L — ABNORMAL LOW (ref 22–32)
Calcium: 9.1 mg/dL (ref 8.9–10.3)
Chloride: 111 mmol/L (ref 98–111)
Creatinine, Ser: 1.29 mg/dL — ABNORMAL HIGH (ref 0.61–1.24)
GFR, Estimated: >60 mL/min (ref >60)
Glucose, Bld: 82 mg/dL (ref 70–99)
Potassium: 4 mmol/L (ref 3.5–5.1)
Sodium: 141 mmol/L (ref 135–145)
Total Bilirubin: 0.3 mg/dL (ref 0.3–1.2)
Total Protein: 6.8 g/dL (ref 6.5–8.1)

## 2022-04-17 LAB — CBC WITH DIFFERENTIAL/PLATELET
Abs Immature Granulocytes: 0.2 10*3/uL — ABNORMAL HIGH (ref 0.00–0.07)
Basophils Absolute: 0.1 10*3/uL (ref 0.0–0.1)
Basophils Relative: 1 %
Eosinophils Absolute: 0.3 10*3/uL (ref 0.0–0.5)
Eosinophils Relative: 4 %
HCT: 44.8 % (ref 39.0–52.0)
Hemoglobin: 14.8 g/dL (ref 13.0–17.0)
Immature Granulocytes: 3 %
Lymphocytes Relative: 23 %
Lymphs Abs: 1.7 10*3/uL (ref 0.7–4.0)
MCH: 33 pg (ref 26.0–34.0)
MCHC: 33 g/dL (ref 30.0–36.0)
MCV: 100 fL (ref 80.0–100.0)
Monocytes Absolute: 0.9 10*3/uL (ref 0.1–1.0)
Monocytes Relative: 12 %
Neutro Abs: 4.4 10*3/uL (ref 1.7–7.7)
Neutrophils Relative %: 57 %
Platelets: 231 10*3/uL (ref 150–400)
RBC: 4.48 MIL/uL (ref 4.22–5.81)
RDW: 13 % (ref 11.5–15.5)
WBC: 7.7 10*3/uL (ref 4.0–10.5)
nRBC: 0 % (ref 0.0–0.2)

## 2022-04-17 LAB — CARBAMAZEPINE LEVEL, TOTAL: Carbamazepine Lvl: <2 ug/mL — ABNORMAL LOW (ref 4.0–12.0)

## 2022-04-17 MED ORDER — LACTATED RINGERS IV SOLN: INTRAVENOUS | Status: DC

## 2022-04-17 NOTE — ED Provider Notes (Signed)
[facility]-EMERGENCY DEPT Provider Note   CSN: [id number] Arrival date & time: 04/17/22  1739     History  Chief Complaint  Patient presents with   Seizures    ALYXANDER KOLLMANN is a 31 y.o. male.  31 year old male with history of seizure disorder presents after having witnessed seizures x2.  Patient has a mild headache otherwise feels back to his baseline.  Takes Keppra and Tegretol denies any recent changes to the dose but is unsure if he has missed a dose.  Did bite his tongue but denies any bladder dysfunction.  No neurological complaints.       Home Medications Prior to Admission medications   Medication Sig Start Date End Date Taking? Authorizing Provider  acetaminophen (TYLENOL) 325 MG tablet Take 650 mg by mouth every 6 (six) hours as needed for headache.    [provider]  levETIRAcetam (KEPPRA) 750 MG tablet Take 2 tablets by mouth 2 times daily. 01/22/22 04/17/23  Penumalli, Glenford Bayley, MD  methocarbamol (ROBAXIN) 750 MG tablet Take one tablet (750 mg dose) by mouth 3 (three) times a day as needed. 02/01/21     TEGRETOL-XR 200 MG 12 hr tablet Take 3 tablets by mouth 2 times daily. 01/25/22 04/20/23  Sater, Pearletha Furl, MD      Allergies    Patient has no known allergies.    Review of Systems   Review of Systems  All other systems reviewed and are negative.   Physical Exam Updated Vital Signs BP 125/73   Pulse 79   Temp 97.6 F (36.4 C) (Oral)   Resp 16   Ht 1.905 m (6\' 3" )   Wt 81.6 kg   SpO2 99%   BMI 22.50 kg/m  Physical Exam Vitals and nursing note reviewed.  Constitutional:      General: He is not in acute distress.    Appearance: Normal appearance. He is well-developed. He is not toxic-appearing.  HENT:     Head: Normocephalic and atraumatic.  Eyes:     General: Lids are normal.     Conjunctiva/sclera: Conjunctivae normal.     Pupils: Pupils are equal, round, and reactive to light.  Neck:     Thyroid: No thyroid  mass.     Trachea: No tracheal deviation.  Cardiovascular:     Rate and Rhythm: Normal rate and regular rhythm.     Heart sounds: Normal heart sounds. No murmur heard.    No gallop.  Pulmonary:     Effort: Pulmonary effort is normal. No respiratory distress.     Breath sounds: Normal breath sounds. No stridor. No decreased breath sounds, wheezing, rhonchi or rales.  Abdominal:     General: There is no distension.     Palpations: Abdomen is soft.     Tenderness: There is no abdominal tenderness. There is no rebound.  Musculoskeletal:        General: No tenderness. Normal range of motion.     Cervical back: Normal range of motion and neck supple.  Skin:    General: Skin is warm and dry.     Findings: No abrasion or rash.  Neurological:     General: No focal deficit present.     Mental Status: He is alert and oriented to person, place, and time. Mental status is at baseline.     GCS: GCS eye subscore is 4. GCS verbal subscore is 5. GCS motor subscore is 6.     Cranial Nerves: No cranial  nerve deficit.     Sensory: No sensory deficit.     Motor: Motor function is intact.  Psychiatric:        Attention and Perception: Attention normal.        Speech: Speech normal.        Behavior: Behavior normal.     ED Results / Procedures / Treatments   Labs (all labs ordered are listed, but only abnormal results are displayed) Labs Reviewed  CBC WITH DIFFERENTIAL/PLATELET  COMPREHENSIVE METABOLIC PANEL  CARBAMAZEPINE LEVEL, TOTAL    EKG EKG Interpretation  Date/Time:  Thursday April 17 2022 17:48:28 EDT Ventricular Rate:  78 PR Interval:  167 QRS Duration: 78 QT Interval:  384 QTC Calculation: 438 R Axis:   76 Text Interpretation: Sinus rhythm ST elev, probable normal early repol pattern No significant change since last tracing Confirmed by Lorre Nick ([id number]) on 04/17/2022 6:02:14 PM  Radiology No results found.  Procedures Procedures    Medications Ordered in  ED Medications  lactated ringers infusion (has no administration in time range)    ED Course/ Medical Decision Making/ A&P                           Medical Decision Making Amount and/or Complexity of Data Reviewed Labs: ordered. Radiology: ordered. ECG/medicine tests: ordered.  Risk Prescription drug management.   Patient here after seizure activity prior to arrival.  Head CT was performed due to patient's history of intracranial mass and it is unchanged.  Tegretol and Keppra level sent which are pending at this time.  Patient has been stable neurologically during his time in the ED.  Patient and wife wish to go home at this time.  They will follow-up with the results in MyChart and follow-up with her neurologist.        Final Clinical Impression(s) / ED Diagnoses Final diagnoses:  None    Rx / DC Orders ED Discharge Orders     None         Lorre Nick, MD 04/17/22 2111

## 2022-04-17 NOTE — ED Triage Notes (Signed)
Pt arrives via EMS from home with 2 witnessed seizures by wife, total of 3 mins. Hx of brain tumor and seizures. Pt on keppra and denies missing any doses. Pt bite right side of tongue. Endorses soreness all over.

## 2022-04-19 LAB — LEVETIRACETAM LEVEL: Levetiracetam Lvl: <2 ug/mL — ABNORMAL LOW (ref 10.0–40.0)

## 2022-04-29 ENCOUNTER — Telehealth: Payer: Self-pay | Admitting: Diagnostic Neuroimaging

## 2022-04-29 NOTE — Telephone Encounter (Signed)
Contacted pt back due to ED visit on 8/24 and not 8/31. He stated he had the dates mixed up. His wife did attend the ED with him but they did not give a note for work to her. Are you willing to provide?

## 2022-04-29 NOTE — Telephone Encounter (Signed)
Pt is calling and stated he had a seizure on 8/31. Pt stated his wife missed work and he need a doctors note for her employment. Pt is requesting a call from nurse

## 2022-04-29 NOTE — Telephone Encounter (Signed)
Letter provided and sent to pt via mychart

## 2022-05-06 ENCOUNTER — Other Ambulatory Visit (HOSPITAL_COMMUNITY): Payer: Self-pay

## 2022-05-07 ENCOUNTER — Other Ambulatory Visit (HOSPITAL_COMMUNITY): Payer: Self-pay

## 2022-05-08 ENCOUNTER — Other Ambulatory Visit (HOSPITAL_COMMUNITY): Payer: Self-pay

## 2022-06-11 ENCOUNTER — Other Ambulatory Visit (HOSPITAL_COMMUNITY): Payer: Self-pay

## 2022-06-30 ENCOUNTER — Other Ambulatory Visit: Payer: Self-pay | Admitting: Nurse Practitioner

## 2022-06-30 ENCOUNTER — Ambulatory Visit: Payer: Self-pay

## 2022-06-30 DIAGNOSIS — M25561 Pain in right knee: Secondary | ICD-10-CM

## 2022-08-05 ENCOUNTER — Inpatient Hospital Stay (HOSPITAL_COMMUNITY)
Admission: EM | Admit: 2022-08-05 | Discharge: 2022-08-07 | DRG: 101 | Disposition: A | Discharge status: Home / Self Care | Payer: 59 | Attending: Student | Admitting: Student

## 2022-08-05 ENCOUNTER — Emergency Department (HOSPITAL_COMMUNITY): Payer: 59

## 2022-08-05 ENCOUNTER — Other Ambulatory Visit: Payer: Self-pay

## 2022-08-05 DIAGNOSIS — R7989 Other specified abnormal findings of blood chemistry: Secondary | ICD-10-CM | POA: Insufficient documentation

## 2022-08-05 DIAGNOSIS — R569 Unspecified convulsions: Secondary | ICD-10-CM

## 2022-08-05 DIAGNOSIS — G809 Cerebral palsy, unspecified: Secondary | ICD-10-CM | POA: Diagnosis present

## 2022-08-05 DIAGNOSIS — G930 Cerebral cysts: Secondary | ICD-10-CM | POA: Insufficient documentation

## 2022-08-05 DIAGNOSIS — T796XXA Traumatic ischemia of muscle, initial encounter: Secondary | ICD-10-CM | POA: Diagnosis present

## 2022-08-05 DIAGNOSIS — G40909 Epilepsy, unspecified, not intractable, without status epilepticus: Principal | ICD-10-CM | POA: Diagnosis present

## 2022-08-05 DIAGNOSIS — F1210 Cannabis abuse, uncomplicated: Secondary | ICD-10-CM | POA: Insufficient documentation

## 2022-08-05 DIAGNOSIS — G44329 Chronic post-traumatic headache, not intractable: Secondary | ICD-10-CM | POA: Diagnosis present

## 2022-08-05 DIAGNOSIS — I4892 Unspecified atrial flutter: Secondary | ICD-10-CM | POA: Diagnosis present

## 2022-08-05 DIAGNOSIS — N179 Acute kidney failure, unspecified: Secondary | ICD-10-CM | POA: Insufficient documentation

## 2022-08-05 DIAGNOSIS — Z8669 Personal history of other diseases of the nervous system and sense organs: Secondary | ICD-10-CM

## 2022-08-05 DIAGNOSIS — Z79899 Other long term (current) drug therapy: Secondary | ICD-10-CM

## 2022-08-05 DIAGNOSIS — G40919 Epilepsy, unspecified, intractable, without status epilepticus: Secondary | ICD-10-CM | POA: Insufficient documentation

## 2022-08-05 DIAGNOSIS — Z8679 Personal history of other diseases of the circulatory system: Secondary | ICD-10-CM

## 2022-08-05 DIAGNOSIS — Z1152 Encounter for screening for COVID-19: Secondary | ICD-10-CM

## 2022-08-05 DIAGNOSIS — W1839XA Other fall on same level, initial encounter: Secondary | ICD-10-CM | POA: Diagnosis present

## 2022-08-05 DIAGNOSIS — T426X6A Underdosing of other antiepileptic and sedative-hypnotic drugs, initial encounter: Secondary | ICD-10-CM | POA: Diagnosis present

## 2022-08-05 DIAGNOSIS — Z7151 Drug abuse counseling and surveillance of drug abuser: Secondary | ICD-10-CM

## 2022-08-05 LAB — CBC
HCT: 42.9 % (ref 39.0–52.0)
Hemoglobin: 14.5 g/dL (ref 13.0–17.0)
MCH: 32.7 pg (ref 26.0–34.0)
MCHC: 33.8 g/dL (ref 30.0–36.0)
MCV: 96.8 fL (ref 80.0–100.0)
Platelets: 233 10*3/uL (ref 150–400)
RBC: 4.43 MIL/uL (ref 4.22–5.81)
RDW: 12.8 % (ref 11.5–15.5)
WBC: 9.4 10*3/uL (ref 4.0–10.5)
nRBC: 0 % (ref 0.0–0.2)

## 2022-08-05 LAB — BASIC METABOLIC PANEL
Anion gap: 9 (ref 5–15)
BUN: 10 mg/dL (ref 6–20)
CO2: 21 mmol/L — ABNORMAL LOW (ref 22–32)
Calcium: 8.9 mg/dL (ref 8.9–10.3)
Chloride: 106 mmol/L (ref 98–111)
Creatinine, Ser: 1.33 mg/dL — ABNORMAL HIGH (ref 0.61–1.24)
GFR, Estimated: >60 mL/min (ref >60)
Glucose, Bld: 76 mg/dL (ref 70–99)
Potassium: 3.7 mmol/L (ref 3.5–5.1)
Sodium: 136 mmol/L (ref 135–145)

## 2022-08-05 MED ORDER — CARBAMAZEPINE 200 MG PO TABS
200.0000 mg | ORAL_TABLET | Freq: Once | ORAL | Status: AC
  Administered 2022-08-05: 200 mg via ORAL
  Filled 2022-08-05: qty 1

## 2022-08-05 MED ORDER — LORAZEPAM 2 MG/ML IJ SOLN
INTRAMUSCULAR | Status: AC
  Administered 2022-08-05: 2 mg via INTRAVENOUS
  Filled 2022-08-05: qty 1

## 2022-08-05 MED ORDER — LEVETIRACETAM IN NACL 1000 MG/100ML IV SOLN
1000.0000 mg | Freq: Once | INTRAVENOUS | Status: AC
  Administered 2022-08-05: 1000 mg via INTRAVENOUS
  Filled 2022-08-05: qty 100

## 2022-08-05 MED ORDER — ACETAMINOPHEN 500 MG PO TABS
1000.0000 mg | ORAL_TABLET | Freq: Once | ORAL | Status: AC
  Administered 2022-08-05: 1000 mg via ORAL
  Filled 2022-08-05: qty 2

## 2022-08-05 MED ORDER — LORAZEPAM 2 MG/ML IJ SOLN: 2.0000 mg | Freq: Once | INTRAMUSCULAR | Status: AC

## 2022-08-05 MED ORDER — LORAZEPAM 2 MG/ML IJ SOLN
INTRAMUSCULAR | Status: AC
  Filled 2022-08-05: qty 1

## 2022-08-05 NOTE — ED Provider Notes (Signed)
[facility]-EMERGENCY DEPT Provider Note  CSN: [id number] Arrival date & time: 08/05/22 2029  Chief Complaint(s) Seizures  HPI Ricky Humphrey is a 31 y.o. male with history of arachnoid cyst, cerebral palsy, epilepsy on Keppra, Tegretol presenting to the emergency department a seizure.  Patient had seizure earlier today.  EMS was called and brought him to the emergency department.  Wife reports that he was combative after the seizure which is typical.  He did fall and hit his head during the seizure.  He reports having a headache which is typical for him after his seizures.  He denies any recent fevers or chills, painful urination, cough, runny nose or sore throat, diarrhea, vomiting.  He reports he has been compliant with all of his medications.  He reports since the seizure he has had some since daylight which is also pretty typical after a seizure.   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 Authorizing Provider  acetaminophen (TYLENOL) 325 MG tablet Take 650 mg by mouth every 6 (six) hours as needed for headache.   Yes [provider]  levETIRAcetam (KEPPRA) 750 MG tablet Take 2 tablets by mouth 2 times daily. 01/22/22 04/17/23 Yes Penumalli, Vikram R, MD  TEGRETOL-XR 200 MG 12 hr tablet Take 3 tablets by mouth 2 times daily. 01/25/22 04/20/23 Yes Sater, Pearletha Furl, MD   methocarbamol (ROBAXIN) 750 MG tablet Take one tablet (750 mg dose) by mouth 3 (three) times a day as needed. Patient not taking: Reported on 08/05/2022 02/01/21                                                                                                                                       Past Surgical History Past Surgical History:  Procedure Laterality Date   acrnoid cyst     HAND SURGERY  07/2019   fracture   TONSILLECTOMY AND ADENOIDECTOMY     Family History Family History  Problem Relation Age of Onset   Migraines Mother    Diabetes Mother    Asthma Brother     Social History Social History   Tobacco Use   Smoking status: Never   Smokeless tobacco: Never  Vaping Use   Vaping Use: Never  used  Substance Use Topics   Alcohol use: No    Alcohol/week: 0.0 standard drinks of alcohol   Drug use: No   Allergies Patient has no known allergies.  Review of Systems Review of Systems  All other systems reviewed and are negative.   Physical Exam Vital Signs  I have reviewed the triage vital signs BP 116/73   Pulse 81   Temp 97.6 F (36.4 C)   Resp (!) 28   Ht 6\' 3"  (1.905 m)   Wt 90.7 kg   SpO2 100%   BMI 25.00 kg/m  Physical Exam Vitals and nursing note reviewed.  Constitutional:      General: He is not in acute distress.    Appearance: Normal appearance.  HENT:     Mouth/Throat:     Mouth: Mucous membranes are moist.  Eyes:     Conjunctiva/sclera: Conjunctivae normal.  Cardiovascular:     Rate and Rhythm: Normal rate and regular rhythm.  Pulmonary:     Effort: Pulmonary effort is normal. No respiratory distress.     Breath sounds: Normal breath sounds.  Abdominal:     General: Abdomen is flat.     Palpations: Abdomen is soft.     Tenderness: There is no abdominal tenderness.  Musculoskeletal:     Right lower leg: No edema.     Left lower leg: No edema.  Skin:    General: Skin is warm and dry.     Capillary Refill: Capillary refill takes  less than 2 seconds.  Neurological:     Mental Status: He is alert and oriented to person, place, and time.     Comments: Cranial nerves II through XII intact.  Strength 4+ out of 5 in the right upper and lower extremity and 5 out of 5 in left upper and lower extremity.  Patient reports this is his baseline.  No sensory deficit to light touch.  No dysmetria on finger-nose-finger testing.  Psychiatric:        Mood and Affect: Mood normal.        Behavior: Behavior normal.     ED Results and Treatments Labs (all labs ordered are listed, but only abnormal results are displayed) Labs Reviewed  BASIC METABOLIC PANEL - Abnormal; Notable for the following components:      Result Value   CO2 21 (*)    Creatinine, Ser 1.33 (*)    All other components within normal limits  CBC  CARBAMAZEPINE LEVEL, TOTAL                                                                                                                          Radiology CT Head Wo Contrast  Result Date: 08/05/2022 CLINICAL DATA:  Recent seizure activity and fall with headaches and neck pain, initial encounter EXAM: CT HEAD WITHOUT CONTRAST CT CERVICAL SPINE WITHOUT CONTRAST TECHNIQUE: Multidetector CT imaging of the head and cervical spine was performed following the standard protocol without intravenous  contrast. Multiplanar CT image reconstructions of the cervical spine were also generated. RADIATION DOSE REDUCTION: This exam was performed according to the departmental dose-optimization program which includes automated exposure control, adjustment of the mA and/or kV according to patient size and/or use of iterative reconstruction technique. 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 No hemorrhage, acute infarction or space-occupying mass lesion is noted. Vascular: No hyperdense vessel or unexpected  calcification. Skull: Prior craniotomy on the left is again noted. Sinuses/Orbits: No acute finding. Other: None. CT CERVICAL SPINE FINDINGS Alignment: Within normal limits. Skull base and vertebrae: 7 cervical segments are well visualized. 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 This involves both the vertebral body and the posterior elements particularly on the right. No compression deformity is seen. No acute fracture or acute facet abnormality is noted. Soft tissues and spinal canal: Surrounding soft tissue structures are within normal limits. Upper chest: Visualized lung apices are unremarkable. 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 No acute abnormality noted. CT of the cervical spine: No acute abnormality noted. 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 Electronically Signed   By: Alcide Clever M.D.   On: 08/05/2022 23:47   CT Cervical Spine Wo Contrast  Result Date: 08/05/2022 CLINICAL DATA:  Recent seizure activity and fall with headaches and neck pain, initial encounter EXAM: CT HEAD WITHOUT CONTRAST CT CERVICAL SPINE WITHOUT CONTRAST TECHNIQUE: Multidetector CT imaging of the head and cervical spine was performed following the standard protocol without intravenous contrast. Multiplanar CT image reconstructions of the cervical spine were also generated. RADIATION DOSE REDUCTION: This exam was performed according to the departmental dose-optimization program which includes automated exposure control, adjustment of the mA and/or kV according to patient size and/or use of iterative reconstruction technique. 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 No hemorrhage, acute infarction or space-occupying mass lesion is noted. Vascular: No hyperdense vessel or  unexpected calcification. Skull: Prior craniotomy on the left is again noted. Sinuses/Orbits: No acute finding. Other: None. CT CERVICAL SPINE FINDINGS Alignment: Within normal limits. Skull base and vertebrae: 7 cervical segments are well visualized. 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 This involves both the vertebral body and the posterior elements particularly on the right. No compression deformity is seen. No acute fracture or acute facet abnormality is noted. Soft tissues and spinal canal: Surrounding soft tissue structures are within normal limits. Upper chest: Visualized lung apices are unremarkable. 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 No acute abnormality noted. CT of the cervical spine: No acute abnormality noted. 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 Electronically Signed   By: Alcide Clever M.D.   On: 08/05/2022 23:47    Pertinent labs & imaging results that were available during my care of the patient were reviewed by me and considered in my medical decision making (see MDM for details).  Medications Ordered in ED Medications  acetaminophen (TYLENOL) tablet 1,000 mg (1,000 mg Oral Given 08/05/22 2217)  levETIRAcetam (KEPPRA) IVPB 1000 mg/100 mL premix (0 mg Intravenous Stopped 08/05/22 2249)  carbamazepine (TEGRETOL) tablet 200 mg (200 mg Oral Given 08/05/22 2217)  levETIRAcetam (KEPPRA) IVPB 1000 mg/100 mL premix (1,000 mg Intravenous New Bag/Given 08/05/22 2250)  LORazepam (ATIVAN) injection 2 mg (2 mg Intravenous Given 08/05/22 2312)                                                                                                                                     Procedures .Critical Care  Performed by: Lonell GrandchildScheving, Emric Kowalewski L, MD Authorized by: Lonell GrandchildScheving, Khristin Keleher L, MD   Critical care provider statement:    Critical care time (minutes):  30   Critical care was  necessary to treat or prevent imminent or life-threatening deterioration of the following conditions:  CNS failure or compromise   Critical care was time spent personally by me on the following activities:  Development of treatment plan with patient or surrogate, discussions with consultants, evaluation of patient's response to treatment, examination of patient, ordering and review of laboratory studies, ordering and review of radiographic studies, ordering and performing treatments and interventions, pulse oximetry, re-evaluation of patient's condition and review of old charts   (including critical care time)  Medical Decision Making / ED Course   MDM:  31 year old male presenting to the emergency department with seizure.  Patient well-appearing, neurologic exam reassuring.  No clear trigger of his seizure.  He has been compliant with his medications.  His vital signs are reassuring.  He has no obvious source of infection to trigger his seizure.  While in the emergency department he had a repeat seizure which lasted around a minute.  The seizure terminated on its own and he received 2 mg of Ativan following the seizure..  After he received his normal dose of Keppra and Tegretol.  I gave additional 1 g of Keppra.  Discussed with Dr. Jerrell BelfastAurora of neurology who agrees with proceeding with CT scan given that he hit his head.  He also recommends obtaining MRI of his head.  If patient does not return to baseline within the next 2 to 3 hours he request that we page him again he will likely [facility] management such as long-term monitoring and EEG.  Patient currently still postictal. Discussed with Dr. Madilyn Hookees who will take over care.      Additional history obtained: -Additional history obtained from family and ems -External records from outside source obtained and reviewed including: Chart review including previous notes, labs, imaging, consultation notes including ED visit  04/17/22   Lab Tests: -I ordered, reviewed, and interpreted labs.   The pertinent results include:   Labs Reviewed  BASIC METABOLIC PANEL - Abnormal; Notable for the following components:      Result Value   CO2 21 (*)    Creatinine, Ser 1.33 (*)    All other components within normal limits  CBC  CARBAMAZEPINE LEVEL, TOTAL    Notable for mild AKI  EKG   EKG Interpretation  Date/Time:  Tuesday August 05 2022 20:42:03 EST  Ventricular Rate:  73 PR Interval:  192 QRS Duration: 88 QT Interval:  369 QTC Calculation: 407 R Axis:   68 Text Interpretation: Sinus rhythm Benign early repolarization No significant change since last tracing Confirmed by Alvino Blood ([id number]) on 08/05/2022 10:40:14 PM         Imaging Studies ordered: I ordered imaging studies including CT head  On my interpretation imaging demonstrates no acute process. Chronic encephalomalacai  I independently visualized and interpreted imaging. I agree with the radiologist interpretation   Medicines ordered and prescription drug management: Meds ordered this encounter  Medications   acetaminophen (TYLENOL) tablet 1,000 mg   levETIRAcetam (KEPPRA) IVPB 1000 mg/100 mL premix   carbamazepine (TEGRETOL) tablet 200 mg   LORazepam (ATIVAN) 2 MG/ML injection    Kerrie Pleasure B: cabinet override   levETIRAcetam (KEPPRA) IVPB 1000 mg/100 mL premix   LORazepam (ATIVAN) injection 2 mg   LORazepam (ATIVAN) 2 MG/ML injection    Bertram Millard N: cabinet override    -I have reviewed the patients home medicines and have made adjustments as needed   Consultations Obtained: I requested consultation with the neurologist,  and discussed lab and imaging findings as well as pertinent plan - they recommend: MRI brain, continue to monitor   Cardiac Monitoring: The patient was maintained on a cardiac monitor.  I personally viewed and interpreted the cardiac monitored which showed an underlying rhythm of: normal sinus  rhythm   Reevaluation: After the interventions noted above, I reevaluated the patient and found that they have improved  Co morbidities that complicate the patient evaluation  Past Medical History:  Diagnosis Date   Arachnoid cyst    s/p drainage by craniotomy   Cerebral palsy (HCC)    mild   Epilepsy (HCC)    Headache    Paroxysmal atrial flutter (HCC)    brief, post sz on 09/22/11   Seizure (HCC)    Seizures (HCC)    last June 2020      Dispostion: Disposition decision including need for hospitalization was considered, and patient disposition pending at time of sign out    Final Clinical Impression(s) / ED Diagnoses Final diagnoses:  Seizure (HCC)     This chart was dictated using voice recognition software.  Despite best efforts to proofread,  errors can occur which can change the documentation meaning.    Lonell Grandchild, MD 08/05/22 2352

## 2022-08-05 NOTE — ED Triage Notes (Signed)
Pt states he last had a seizure about a year ago. Denies any recent sickness, has been under increased stress. Last took his seizure meds this morning and has not missed any doses. Only c/o pain in his tongue.

## 2022-08-05 NOTE — ED Triage Notes (Signed)
Pt arrives from home via [facility], per report, pt had witnessed seizure and fall by his wife. Keppra and tegretol taking at home  Hit his head, having neck and head pain from fall. Seizure for 2 minutes, post ictal on arrival to scene. Wife says his post ictal state was longer than normal. Alert/oriented, does c/o fatigue on arrival to ED. Vitals 110/60, hr 70, 98% ra, rr 16. Cbg 111

## 2022-08-06 ENCOUNTER — Encounter (HOSPITAL_COMMUNITY): Payer: Self-pay | Admitting: Internal Medicine

## 2022-08-06 ENCOUNTER — Emergency Department (HOSPITAL_COMMUNITY): Payer: 59

## 2022-08-06 DIAGNOSIS — G40909 Epilepsy, unspecified, not intractable, without status epilepticus: Secondary | ICD-10-CM | POA: Diagnosis present

## 2022-08-06 DIAGNOSIS — G44329 Chronic post-traumatic headache, not intractable: Secondary | ICD-10-CM | POA: Diagnosis present

## 2022-08-06 DIAGNOSIS — Z1152 Encounter for screening for COVID-19: Secondary | ICD-10-CM | POA: Diagnosis not present

## 2022-08-06 DIAGNOSIS — G40919 Epilepsy, unspecified, intractable, without status epilepticus: Secondary | ICD-10-CM | POA: Diagnosis not present

## 2022-08-06 DIAGNOSIS — G809 Cerebral palsy, unspecified: Secondary | ICD-10-CM | POA: Diagnosis present

## 2022-08-06 DIAGNOSIS — Z7151 Drug abuse counseling and surveillance of drug abuser: Secondary | ICD-10-CM | POA: Diagnosis not present

## 2022-08-06 DIAGNOSIS — T796XXA Traumatic ischemia of muscle, initial encounter: Secondary | ICD-10-CM

## 2022-08-06 DIAGNOSIS — F1210 Cannabis abuse, uncomplicated: Secondary | ICD-10-CM | POA: Insufficient documentation

## 2022-08-06 DIAGNOSIS — T426X6A Underdosing of other antiepileptic and sedative-hypnotic drugs, initial encounter: Secondary | ICD-10-CM | POA: Diagnosis present

## 2022-08-06 DIAGNOSIS — G930 Cerebral cysts: Secondary | ICD-10-CM | POA: Diagnosis present

## 2022-08-06 DIAGNOSIS — I4892 Unspecified atrial flutter: Secondary | ICD-10-CM | POA: Diagnosis present

## 2022-08-06 DIAGNOSIS — R7989 Other specified abnormal findings of blood chemistry: Secondary | ICD-10-CM | POA: Insufficient documentation

## 2022-08-06 DIAGNOSIS — R569 Unspecified convulsions: Secondary | ICD-10-CM

## 2022-08-06 DIAGNOSIS — N179 Acute kidney failure, unspecified: Secondary | ICD-10-CM

## 2022-08-06 DIAGNOSIS — Z8679 Personal history of other diseases of the circulatory system: Secondary | ICD-10-CM

## 2022-08-06 DIAGNOSIS — Z91199 Patient's noncompliance with other medical treatment and regimen due to unspecified reason: Secondary | ICD-10-CM | POA: Diagnosis not present

## 2022-08-06 DIAGNOSIS — W1839XA Other fall on same level, initial encounter: Secondary | ICD-10-CM | POA: Diagnosis present

## 2022-08-06 DIAGNOSIS — Z8669 Personal history of other diseases of the nervous system and sense organs: Secondary | ICD-10-CM | POA: Diagnosis not present

## 2022-08-06 DIAGNOSIS — Z79899 Other long term (current) drug therapy: Secondary | ICD-10-CM | POA: Diagnosis not present

## 2022-08-06 LAB — HEPATIC FUNCTION PANEL
ALT: 24 U/L (ref 0–44)
AST: 35 U/L (ref 15–41)
Albumin: 3.2 g/dL — ABNORMAL LOW (ref 3.5–5.0)
Alkaline Phosphatase: 50 U/L (ref 38–126)
Bilirubin, Direct: 0.4 mg/dL — ABNORMAL HIGH (ref 0.0–0.2)
Indirect Bilirubin: 0.4 mg/dL (ref 0.3–0.9)
Total Bilirubin: 0.8 mg/dL (ref 0.3–1.2)
Total Protein: 6.4 g/dL — ABNORMAL LOW (ref 6.5–8.1)

## 2022-08-06 LAB — RAPID URINE DRUG SCREEN, HOSP PERFORMED
Amphetamines: NOT DETECTED
Barbiturates: NOT DETECTED
Benzodiazepines: POSITIVE — AB
Cocaine: NOT DETECTED
Opiates: NOT DETECTED
Tetrahydrocannabinol: POSITIVE — AB

## 2022-08-06 LAB — BRAIN NATRIURETIC PEPTIDE: B Natriuretic Peptide: 33.2 pg/mL (ref 0.0–100.0)

## 2022-08-06 LAB — RENAL FUNCTION PANEL
Albumin: 3.4 g/dL — ABNORMAL LOW (ref 3.5–5.0)
Anion gap: 4 — ABNORMAL LOW (ref 5–15)
BUN: 9 mg/dL (ref 6–20)
CO2: 26 mmol/L (ref 22–32)
Calcium: 8.5 mg/dL — ABNORMAL LOW (ref 8.9–10.3)
Chloride: 106 mmol/L (ref 98–111)
Creatinine, Ser: 1.27 mg/dL — ABNORMAL HIGH (ref 0.61–1.24)
GFR, Estimated: >60 mL/min (ref >60)
Glucose, Bld: 101 mg/dL — ABNORMAL HIGH (ref 70–99)
Phosphorus: 3.5 mg/dL (ref 2.5–4.6)
Potassium: 3.8 mmol/L (ref 3.5–5.1)
Sodium: 136 mmol/L (ref 135–145)

## 2022-08-06 LAB — RESP PANEL BY RT-PCR (RSV, FLU A&B, COVID)  RVPGX2
Influenza A by PCR: NEGATIVE
Influenza B by PCR: NEGATIVE
Resp Syncytial Virus by PCR: NEGATIVE
SARS Coronavirus 2 by RT PCR: NEGATIVE

## 2022-08-06 LAB — URINALYSIS, ROUTINE W REFLEX MICROSCOPIC
Bacteria, UA: NONE SEEN
Bilirubin Urine: NEGATIVE
Glucose, UA: NEGATIVE mg/dL
Ketones, ur: NEGATIVE mg/dL
Leukocytes,Ua: NEGATIVE
Nitrite: NEGATIVE
Protein, ur: NEGATIVE mg/dL
Specific Gravity, Urine: 1.024 (ref 1.005–1.030)
pH: 5 (ref 5.0–8.0)

## 2022-08-06 LAB — CK: Total CK: 2436 U/L — ABNORMAL HIGH (ref 49–397)

## 2022-08-06 LAB — MAGNESIUM: Magnesium: 2.1 mg/dL (ref 1.7–2.4)

## 2022-08-06 LAB — CARBAMAZEPINE LEVEL, TOTAL: Carbamazepine Lvl: 2.6 ug/mL — ABNORMAL LOW (ref 4.0–12.0)

## 2022-08-06 LAB — HIV ANTIBODY (ROUTINE TESTING W REFLEX): HIV Screen 4th Generation wRfx: NONREACTIVE

## 2022-08-06 LAB — MRSA NEXT GEN BY PCR, NASAL: MRSA by PCR Next Gen: NOT DETECTED

## 2022-08-06 MED ORDER — CHLORHEXIDINE GLUCONATE CLOTH 2 % EX PADS
6.0000 | MEDICATED_PAD | Freq: Every day | CUTANEOUS | Status: DC
  Administered 2022-08-06: 6 via TOPICAL

## 2022-08-06 MED ORDER — ORAL CARE MOUTH RINSE: 15.0000 mL | OROMUCOSAL | Status: DC | PRN

## 2022-08-06 MED ORDER — SODIUM CHLORIDE 0.9 % IV SOLN: INTRAVENOUS | Status: DC

## 2022-08-06 MED ORDER — LEVETIRACETAM 500 MG PO TABS
1500.0000 mg | ORAL_TABLET | Freq: Two times a day (BID) | ORAL | Status: DC
  Administered 2022-08-06 – 2022-08-07 (×3): 1500 mg via ORAL
  Filled 2022-08-06 (×3): qty 3

## 2022-08-06 MED ORDER — ACETAMINOPHEN 650 MG RE SUPP: 650.0000 mg | Freq: Four times a day (QID) | RECTAL | Status: DC | PRN

## 2022-08-06 MED ORDER — GADOBUTROL 1 MMOL/ML IV SOLN
9.0000 mL | Freq: Once | INTRAVENOUS | Status: AC | PRN
  Administered 2022-08-06: 9 mL via INTRAVENOUS

## 2022-08-06 MED ORDER — SODIUM CHLORIDE 0.9 % IV BOLUS
2000.0000 mL | Freq: Once | INTRAVENOUS | Status: AC
  Administered 2022-08-06: 2000 mL via INTRAVENOUS

## 2022-08-06 MED ORDER — SODIUM CHLORIDE 0.9 % IV SOLN: 2000.0000 mg | Freq: Once | INTRAVENOUS | Status: DC

## 2022-08-06 MED ORDER — CARBAMAZEPINE ER 200 MG PO TB12
600.0000 mg | ORAL_TABLET | Freq: Two times a day (BID) | ORAL | Status: DC
  Administered 2022-08-06 – 2022-08-07 (×3): 600 mg via ORAL
  Filled 2022-08-06 (×4): qty 3

## 2022-08-06 MED ORDER — LEVETIRACETAM IN NACL 1500 MG/100ML IV SOLN
1500.0000 mg | Freq: Two times a day (BID) | INTRAVENOUS | Status: DC
  Filled 2022-08-06: qty 100

## 2022-08-06 MED ORDER — SODIUM CHLORIDE 0.9 % IV SOLN
2000.0000 mg | INTRAVENOUS | Status: AC
  Administered 2022-08-06: 2000 mg via INTRAVENOUS
  Filled 2022-08-06: qty 20

## 2022-08-06 MED ORDER — ACETAMINOPHEN 325 MG PO TABS: 650.0000 mg | ORAL_TABLET | Freq: Four times a day (QID) | ORAL | Status: DC | PRN

## 2022-08-06 NOTE — H&P (Signed)
History and Physical    AYCE PIETRZYK BPZ:[id number] DOB: July 19, 1991 DOA: 08/05/2022  PCP: Pcp, No  Patient coming from: Home  Chief Complaint: Seizure  HPI: Ricky Humphrey is a 31 y.o. male with medical history significant of mild cerebral palsy, arachnoid cyst status post craniotomy and unsuccessful cyst resection, seizure disorder currently on Tegretol and Keppra at home, paroxysmal atrial flutter presented to the ED via EMS after he had a seizure at home.  Wife reported that he was combative after the seizure which is typical.  He did fall and hit his head during the seizure.  Patient was alert and awake on arrival to the ED and was Tegretol 200 mg.  Vital signs stable on arrival to the ED.  While in the ED, he had another seizure which lasted around a minute.  The seizure terminated on its own.  He was combative afterwards and was given 2 mg dose of Ativan.  He was given additional IV Keppra.  Labs showing no leukocytosis, creatinine 1.3 (stable), carbamazepine level 2.6, magnesium 2.1, COVID and influenza PCR negative, UA not suggestive of infection, UDS positive for benzodiazepines and THC.  CT head/C-spine negative for acute finding.  Brain MRI negative for acute finding.  Chest x-ray showing low lung volumes with bronchovascular crowding versus vascular congestion. Neurology consulted.  [facility] called to admit.  Patient is currently somnolent.  He was able to wake up for a few seconds and tell me his name and that he is here for seizures but then fell asleep again.  Not able to give any additional history at this time.  Review of Systems:  Review of Systems  All other systems reviewed and are negative.   Past Medical History:  Diagnosis Date   Arachnoid cyst    s/p drainage by craniotomy   Cerebral palsy (HCC)    mild   Epilepsy (HCC)    Headache    Paroxysmal atrial flutter (HCC)    brief, post sz on 09/22/11   Seizure (HCC)    Seizures (HCC)    last June 2020     Past Surgical History:  Procedure Laterality Date   acrnoid cyst     HAND SURGERY  07/2019   fracture   TONSILLECTOMY AND ADENOIDECTOMY       reports that he has never smoked. He has never used smokeless tobacco. He reports that he does not drink alcohol and does not use drugs.  No Known Allergies  Family History  Problem Relation Age of Onset   Migraines Mother    Diabetes Mother    Asthma Brother     Prior to Admission medications   Medication Sig Start Date End Date Taking? Authorizing Provider  acetaminophen (TYLENOL) 325 MG tablet Take 650 mg by mouth every 6 (six) hours as needed for headache.   Yes [provider]  levETIRAcetam (KEPPRA) 750 MG tablet Take 2 tablets by mouth 2 times daily. 01/22/22 04/17/23 Yes Penumalli, Vikram R, MD  TEGRETOL-XR 200 MG 12 hr tablet Take 3 tablets by mouth 2 times daily. 01/25/22 04/20/23 Yes Sater, Pearletha Furl, MD  methocarbamol (ROBAXIN) 750 MG tablet Take one tablet (750 mg dose) by mouth 3 (three) times a day as needed. Patient not taking: Reported on 08/05/2022 02/01/21       Physical Exam: Vitals:   08/06/22 0300 08/06/22 0330 08/06/22 0334 08/06/22 0400  BP: 116/61 116/68  (!) 115/59  Pulse: 62 62 (!) 59 72  Resp:  (!) 25  17 19  Temp:   97.9 F (36.6 C)   SpO2: 100% 96% 98% 95%  Weight:      Height:        Physical Exam Vitals reviewed.  Constitutional:      General: He is not in acute distress. HENT:     Head: Normocephalic and atraumatic.  Cardiovascular:     Rate and Rhythm: Normal rate and regular rhythm.     Pulses: Normal pulses.  Pulmonary:     Effort: Pulmonary effort is normal. No respiratory distress.     Breath sounds: Normal breath sounds. No wheezing or rales.  Abdominal:     General: Bowel sounds are normal. There is no distension.     Palpations: Abdomen is soft.     Tenderness: There is no abdominal tenderness.  Musculoskeletal:     Cervical back: Normal range of motion.     Right lower  leg: No edema.     Left lower leg: No edema.  Skin:    General: Skin is warm and dry.  Neurological:     Mental Status: He is alert.     Comments: Somnolent     Labs on Admission: I have personally reviewed following labs and imaging studies  CBC: Recent Labs  Lab 08/05/22 2050  WBC 9.4  HGB 14.5  HCT 42.9  MCV 96.8  PLT 233   Basic Metabolic Panel: Recent Labs  Lab 08/05/22 2050  NA 136  K 3.7  CL 106  CO2 21*  GLUCOSE 76  BUN 10  CREATININE 1.33*  CALCIUM 8.9  MG 2.1   GFR: Estimated Creatinine Clearance: 97.1 mL/min (A) (by C-G formula based on SCr of 1.33 mg/dL (H)). Liver Function Tests: Recent Labs  Lab 08/05/22 2050  AST 35  ALT 24  ALKPHOS 50  BILITOT 0.8  PROT 6.4*  ALBUMIN 3.2*   No results for input(s): "LIPASE", "AMYLASE" in the last 168 hours. No results for input(s): "AMMONIA" in the last 168 hours. Coagulation Profile: No results for input(s): "INR", "PROTIME" in the last 168 hours. Cardiac Enzymes: No results for input(s): "CKTOTAL", "CKMB", "CKMBINDEX", "TROPONINI" in the last 168 hours. BNP (last 3 results) No results for input(s): "PROBNP" in the last 8760 hours. HbA1C: No results for input(s): "HGBA1C" in the last 72 hours. CBG: No results for input(s): "GLUCAP" in the last 168 hours. Lipid Profile: No results for input(s): "CHOL", "HDL", "LDLCALC", "TRIG", "CHOLHDL", "LDLDIRECT" in the last 72 hours. Thyroid Function Tests: No results for input(s): "TSH", "T4TOTAL", "FREET4", "T3FREE", "THYROIDAB" in the last 72 hours. Anemia Panel: No results for input(s): "VITAMINB12", "FOLATE", "FERRITIN", "TIBC", "IRON", "RETICCTPCT" in the last 72 hours. Urine analysis:    Component Value Date/Time   COLORURINE YELLOW 08/06/2022 0405   APPEARANCEUR CLOUDY (A) 08/06/2022 0405   LABSPEC 1.024 08/06/2022 0405   PHURINE 5.0 08/06/2022 0405   GLUCOSEU NEGATIVE 08/06/2022 0405   HGBUR SMALL (A) 08/06/2022 0405   BILIRUBINUR NEGATIVE  08/06/2022 0405   KETONESUR NEGATIVE 08/06/2022 0405   PROTEINUR NEGATIVE 08/06/2022 0405   UROBILINOGEN 0.2 06/15/2013 1216   NITRITE NEGATIVE 08/06/2022 0405   LEUKOCYTESUR NEGATIVE 08/06/2022 0405    Radiological Exams on Admission: DG Chest Port 1 View  Result Date: 08/06/2022 CLINICAL DATA:  Seizure. EXAM: PORTABLE CHEST 1 VIEW COMPARISON:  None Available. FINDINGS: Lung volumes are low. The heart is normal in size for technique. Bronchovascular crowding versus vascular congestion. No confluent consolidation. No pneumothorax or large pleural effusion IMPRESSION:  Low lung volumes with bronchovascular crowding versus vascular congestion. Electronically Signed   By: Narda Rutherford M.D.   On: 08/06/2022 01:42   MR Brain W and Wo Contrast  Result Date: 08/06/2022 CLINICAL DATA:  Seizure EXAM: MRI HEAD WITHOUT AND WITH CONTRAST TECHNIQUE: Multiplanar, multiecho pulse sequences of the brain and surrounding structures were obtained without and with intravenous contrast. CONTRAST:  47mL GADAVIST GADOBUTROL 1 MMOL/ML IV SOLN COMPARISON:  05/08/2017 FINDINGS: Brain: No acute infarct, mass effect or extra-axial collection. No acute or chronic hemorrhage. 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 The midline structures are normal. Vascular: Major flow voids are preserved. Skull and upper cervical spine: Normal calvarium and skull base. Visualized upper cervical spine and soft tissues are normal. Sinuses/Orbits:No paranasal sinus fluid levels or advanced mucosal thickening. No mastoid or middle ear effusion. Normal orbits. IMPRESSION: 1. No acute intracranial abnormality. 2. 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 Electronically Signed   By: Deatra Robinson M.D.   On: 08/06/2022 00:51   CT Head Wo Contrast  Result Date: 08/05/2022 CLINICAL DATA:  Recent seizure activity and fall with headaches and neck pain, initial  encounter EXAM: CT HEAD WITHOUT CONTRAST CT CERVICAL SPINE WITHOUT CONTRAST TECHNIQUE: Multidetector CT imaging of the head and cervical spine was performed following the standard protocol without intravenous contrast. Multiplanar CT image reconstructions of the cervical spine were also generated. RADIATION DOSE REDUCTION: This exam was performed according to the departmental dose-optimization program which includes automated exposure control, adjustment of the mA and/or kV according to patient size and/or use of iterative reconstruction technique. 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 No hemorrhage, acute infarction or space-occupying mass lesion is noted. Vascular: No hyperdense vessel or unexpected calcification. Skull: Prior craniotomy on the left is again noted. Sinuses/Orbits: No acute finding. Other: None. CT CERVICAL SPINE FINDINGS Alignment: Within normal limits. Skull base and vertebrae: 7 cervical segments are well visualized. 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 This involves both the vertebral body and the posterior elements particularly on the right. No compression deformity is seen. No acute fracture or acute facet abnormality is noted. Soft tissues and spinal canal: Surrounding soft tissue structures are within normal limits. Upper chest: Visualized lung apices are unremarkable. 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 No acute abnormality noted. CT of the cervical spine: No acute abnormality noted. 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 Electronically Signed   By: Alcide Clever M.D.   On: 08/05/2022 23:47   CT Cervical Spine Wo Contrast  Result Date: 08/05/2022 CLINICAL DATA:  Recent seizure activity and fall with headaches and neck pain,  initial encounter EXAM: CT HEAD WITHOUT CONTRAST CT CERVICAL SPINE WITHOUT CONTRAST TECHNIQUE: Multidetector CT imaging of the head and cervical spine was performed following the standard protocol without intravenous contrast. Multiplanar CT image reconstructions of the cervical spine were also generated. RADIATION DOSE REDUCTION: This exam was performed according to the departmental dose-optimization program which includes automated exposure control, adjustment of the mA and/or kV according to patient size and/or use of iterative reconstruction technique. 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 No hemorrhage, acute infarction  or space-occupying mass lesion is noted. Vascular: No hyperdense vessel or unexpected calcification. Skull: Prior craniotomy on the left is again noted. Sinuses/Orbits: No acute finding. Other: None. CT CERVICAL SPINE FINDINGS Alignment: Within normal limits. Skull base and vertebrae: 7 cervical segments are well visualized. 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 This involves both the vertebral body and the posterior elements particularly on the right. No compression deformity is seen. No acute fracture or acute facet abnormality is noted. Soft tissues and spinal canal: Surrounding soft tissue structures are within normal limits. Upper chest: Visualized lung apices are unremarkable. 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 No acute abnormality noted. CT of the cervical spine: No acute abnormality noted. 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 Electronically Signed   By: Alcide CleverMark  Lukens M.D.   On: 08/05/2022 23:47    EKG: Independently reviewed.  Sinus rhythm, early repolarization abnormality.  No significant change since prior  tracing.  Assessment and Plan  Seizures -Patient with history of seizure disorder on Tegretol and Keppra presenting after one seizure at home and had an additional seizure in the ED. -CT head and brain MRI negative for acute finding -No infectious signs or symptoms -Neurology consulted, appreciate recommendations -Patient received carbamazepine 200 mg x 1 prior to his second seizure.  He also received Keppra 1 g IV.  He was given Ativan 2 mg after his second seizure.  Neurology gave additional 2000 mg of IV Keppra. -Neurology recommending continuing Keppra 1500 mg twice daily from 10 AM onwards.  Once he is able to take p.o. medications, recommending starting him back on his Tegretol 600 mg twice daily as level was subtherapeutic. -Seizure precautions -If mentation does not improve in the next few hours, consider spot EEG. -If mentation improves, neurology recommending discharging home on Keppra 1500 mg twice daily and Tegretol 600 mg twice daily.  Outpatient neurology follow-up in 2 to 4 weeks.  Abnormal chest x-ray finding Chest x-ray showing low lung volumes with bronchovascular crowding versus vascular congestion.  No history of CHF.  He is not hypoxic. -Check BNP  Marijuana abuse UDS positive for THC. -Currently somnolent, will need counseling.  History of paroxysmal atrial flutter Currently in sinus rhythm. -Cardiac monitoring  History of cerebral palsy  DVT prophylaxis: SCDs Code Status: Full Code Family Communication: No family available at this time. Consults called: Neurology Level of care: Step Down Unit Admission status: It is my clinical opinion that referral for OBSERVATION is reasonable and necessary in this patient based on the above information provided. The aforementioned taken together are felt to place the patient at high risk for further clinical deterioration. However, it is anticipated that the patient may be medically stable for discharge from the hospital  within 24 to 48 hours.   John GiovanniVasundhra Miranda Frese MD [facility]  If 7PM-7AM, please contact night-coverage [email or web address]  08/06/2022, 5:31 AM

## 2022-08-06 NOTE — Progress Notes (Signed)
PROGRESS NOTE  Ricky Humphrey OFB:[id number] DOB: 12/12/1990   PCP: Pcp, No  Patient is from: Home.  DOA: 08/05/2022 LOS: 0  Chief complaints Chief Complaint  Patient presents with   Seizures     Brief Narrative / Interim history: 31 year old M with PMH of mild cerebral palsy, arachnoid cyst s/p craniotomy and unsuccessful cyst resection, seizure disorder and marijuana use presenting with seizure and fall, and admitted for breakthrough seizure.   Lab work, CXR, CT head and C-spine and MRI brain without acute finding other than mild AKI.  UDS positive for benzo and marijuana.  Tegretol level low at 2.6.  Neurology consulted.   Patient had additional breakthrough seizure while in ED. he was given IV Ativan and IV Keppra.  Neurology recommended resuming home Keppra and Tegretol.   The next day, has not had further seizure but elevated CK and mild AKI.  Started on rigorous IV fluid.  Subjective: Seen and examined earlier this morning and later this afternoon.  Patient complained of generalized body pain early in the morning.  CK was checked and elevated to 2400.  Cr 1.3.  Started on IV fluid.  However, patient did not want to stay in the hospital and threatened to leave.  After discussion involving his significant other and his mother, he reluctantly agreed to stay overnight for IV fluid hydration.   Objective: Vitals:   08/06/22 0900 08/06/22 1000 08/06/22 1100 08/06/22 1200  BP: 116/70 119/70 136/65 115/63  Pulse: 65 72 65 64  Resp: 16 17 18 19   Temp:   98.1 F (36.7 C)   TempSrc:   Oral   SpO2: 99% 100% 100% 100%  Weight:      Height:        Examination:  GENERAL: No apparent distress.  Nontoxic. HEENT: MMM.  Vision and hearing grossly intact.  NECK: Supple.  No apparent JVD.  RESP:  No IWOB.  Fair aeration bilaterally. CVS:  RRR. Heart sounds normal.  ABD/GI/GU: BS+. Abd soft, NTND.  MSK/EXT:  Moves extremities. No apparent deformity. No edema.  SKIN: no  apparent skin lesion or wound NEURO: Awake, alert and oriented appropriately.  No apparent focal neuro deficit. PSYCH: Calm. Normal affect.    Procedures:  None  Microbiology summarized: COVID-19, influenza and RSV PCR nonreactive.  Assessment and plan: Principal Problem:   Breakthrough seizure (HCC) Active Problems:   History of atrial flutter   Cerebral palsy (HCC)   Marijuana abuse   Traumatic rhabdomyolysis, initial encounter (HCC)   Intracranial arachnoid cyst   AKI (acute kidney injury) (HCC)   Breakthrough seizure: Suspect noncompliance although patient denies.  Tegretol level  low at 2.6.  Keppra level pending.  CT head, CT cervical spine and MRI brain without acute finding.  UDS positive for benzos and marijuana. -Appreciate neurorecommendation. -Resume home Tegretol and Keppra -Continue seizure precaution -Advised to refrain from marijuana use.  Patient is reluctant about this.  He reports using marijuana to relax and prevent seizure -Continue seizure precaution -Follow Keppra level. -Neurology gave recommendation and signed off.  Fall at home due to seizure Traumatic rhabdomyolysis due to fall and seizure.  CK elevated to 2400. -NS 2 L at 500 cc an hour followed by 150 cc an hour -Recheck CK and CMP in the morning.  AKI: ATN from rhabdo?  BUN within normal.  Cr 1.33 which is about the same in August when he had a seizure but previously normal.  -IV fluid as above -Avoid nephrotoxic meds -  Recheck in the morning.  Marijuana use -Encouraged cessation.  Intracranial arachnoid cyst s/p unsuccessful resection in the past.  MRI brain with unchanged large area of cystic encephalomalacia in the posterior left hemisphere. -Outpatient follow-up  Noncompliance with care: was upset when he was told to stay for IV fluid hydration for his rhabdomyolysis.  After further this patient including patient, his spouse and his mother, he reluctantly agreed to stay overnight.   Patient's mother is concerned about his behavior and not making sense which is not typical for him.  He is awake and oriented x 4 with fair insight into his situation.  He has capacity to make medical decision, and cannot be committed involuntarily if he wishes to leave although he might be making poor decision.   Body mass index is 25.71 kg/m.          DVT prophylaxis:  SCDs Start: 08/06/22 0601  Code Status: Full code Family Communication: Updated patient's spouse at bedside and mother over the phone. Level of care: Med-Surg Status is: Observation The patient will require care spanning > 2 midnights and should be moved to inpatient because: Traumatic rhabdomyolysis due to seizure and fall, and possible AKI   Final disposition: Home once medically cleared Consultants:  Neurology  Sch Meds:  Scheduled Meds:  carbamazepine  600 mg Oral BID   Chlorhexidine Gluconate Cloth  6 each Topical Q2200   levETIRAcetam  1,500 mg Oral BID   Continuous Infusions:  sodium chloride     Followed by   sodium chloride     PRN Meds:.acetaminophen **OR** acetaminophen, mouth rinse  Antimicrobials: Anti-infectives (From admission, onward)    None        I have personally reviewed the following labs and images: CBC: Recent Labs  Lab 08/05/22 2050  WBC 9.4  HGB 14.5  HCT 42.9  MCV 96.8  PLT 233   BMP &GFR Recent Labs  Lab 08/05/22 2050 08/06/22 1243  NA 136 136  K 3.7 3.8  CL 106 106  CO2 21* 26  GLUCOSE 76 101*  BUN 10 9  CREATININE 1.33* 1.27*  CALCIUM 8.9 8.5*  MG 2.1  --   PHOS  --  3.5   Estimated Creatinine Clearance: 101.7 mL/min (A) (by C-G formula based on SCr of 1.27 mg/dL (H)). Liver & Pancreas: Recent Labs  Lab 08/05/22 2050 08/06/22 1243  AST 35  --   ALT 24  --   ALKPHOS 50  --   BILITOT 0.8  --   PROT 6.4*  --   ALBUMIN 3.2* 3.4*   No results for input(s): "LIPASE", "AMYLASE" in the last 168 hours. No results for input(s): "AMMONIA" in the  last 168 hours. Diabetic: No results for input(s): "HGBA1C" in the last 72 hours. No results for input(s): "GLUCAP" in the last 168 hours. Cardiac Enzymes: Recent Labs  Lab 08/06/22 1243  CKTOTAL 2,436*   No results for input(s): "PROBNP" in the last 8760 hours. Coagulation Profile: No results for input(s): "INR", "PROTIME" in the last 168 hours. Thyroid Function Tests: No results for input(s): "TSH", "T4TOTAL", "FREET4", "T3FREE", "THYROIDAB" in the last 72 hours. Lipid Profile: No results for input(s): "CHOL", "HDL", "LDLCALC", "TRIG", "CHOLHDL", "LDLDIRECT" in the last 72 hours. Anemia Panel: No results for input(s): "VITAMINB12", "FOLATE", "FERRITIN", "TIBC", "IRON", "RETICCTPCT" in the last 72 hours. Urine analysis:    Component Value Date/Time   COLORURINE YELLOW 08/06/2022 0405   APPEARANCEUR CLOUDY (A) 08/06/2022 0405   LABSPEC 1.024 08/06/2022 0405  PHURINE 5.0 08/06/2022 0405   GLUCOSEU NEGATIVE 08/06/2022 0405   HGBUR SMALL (A) 08/06/2022 0405   BILIRUBINUR NEGATIVE 08/06/2022 0405   KETONESUR NEGATIVE 08/06/2022 0405   PROTEINUR NEGATIVE 08/06/2022 0405   UROBILINOGEN 0.2 06/15/2013 1216   NITRITE NEGATIVE 08/06/2022 0405   LEUKOCYTESUR NEGATIVE 08/06/2022 0405   Sepsis Labs: Invalid input(s): "PROCALCITONIN", "LACTICIDVEN"  Microbiology: Recent Results (from the past 240 hour(s))  Resp panel by RT-PCR (RSV, Flu A&B, Covid) Anterior Nasal Swab     Status: None   Collection Time: 08/06/22  1:35 AM   Specimen: Anterior Nasal Swab  Result Value Ref Range Status   SARS Coronavirus 2 by RT PCR NEGATIVE NEGATIVE Final    Comment: (NOTE) SARS-CoV-2 target nucleic acids are NOT DETECTED.  The SARS-CoV-2 RNA is generally detectable in upper respiratory specimens during the acute phase of infection. The lowest concentration of SARS-CoV-2 viral copies this assay can detect is 138 copies/mL. A negative result does not preclude SARS-Cov-2 infection and should not  be used as the sole basis for treatment or other patient management decisions. A negative result may occur with  improper specimen collection/handling, submission of specimen other than nasopharyngeal swab, presence of viral mutation(s) within the areas targeted by this assay, and inadequate number of viral copies(<138 copies/mL). A negative result must be combined with clinical observations, patient history, and epidemiological information. The expected result is Negative.  Fact Sheet for Patients:  EntrepreneurPulse.com.au  Fact Sheet for Healthcare Providers:  IncredibleEmployment.be  This test is no t yet approved or cleared by the [place] FDA and  has been authorized for detection and/or diagnosis of SARS-CoV-2 by FDA under an Emergency Use Authorization (EUA). This EUA will remain  in effect (meaning this test can be used) for the duration of the COVID-19 declaration under Section 564(b)(1) of the Act, 21 U.S.C.section 360bbb-3(b)(1), unless the authorization is terminated  or revoked sooner.       Influenza A by PCR NEGATIVE NEGATIVE Final   Influenza B by PCR NEGATIVE NEGATIVE Final    Comment: (NOTE) The Xpert Xpress SARS-CoV-2/FLU/RSV plus assay is intended as an aid in the diagnosis of influenza from Nasopharyngeal swab specimens and should not be used as a sole basis for treatment. Nasal washings and aspirates are unacceptable for Xpert Xpress SARS-CoV-2/FLU/RSV testing.  Fact Sheet for Patients: EntrepreneurPulse.com.au  Fact Sheet for Healthcare Providers: IncredibleEmployment.be  This test is not yet approved or cleared by the [place] FDA and has been authorized for detection and/or diagnosis of SARS-CoV-2 by FDA under an Emergency Use Authorization (EUA). This EUA will remain in effect (meaning this test can be used) for the duration of the COVID-19 declaration under Section  564(b)(1) of the Act, 21 U.S.C. section 360bbb-3(b)(1), unless the authorization is terminated or revoked.     Resp Syncytial Virus by PCR NEGATIVE NEGATIVE Final    Comment: (NOTE) Fact Sheet for Patients: EntrepreneurPulse.com.au  Fact Sheet for Healthcare Providers: IncredibleEmployment.be  This test is not yet approved or cleared by the [place] FDA and has been authorized for detection and/or diagnosis of SARS-CoV-2 by FDA under an Emergency Use Authorization (EUA). This EUA will remain in effect (meaning this test can be used) for the duration of the COVID-19 declaration under Section 564(b)(1) of the Act, 21 U.S.C. section 360bbb-3(b)(1), unless the authorization is terminated or revoked.  Performed at [facility], [place] [address] [place], [place] [address]   MRSA Next Gen by PCR, Nasal  Status: None   Collection Time: 08/06/22  8:36 AM   Specimen: Nasal Mucosa; Nasal Swab  Result Value Ref Range Status   MRSA by PCR Next Gen NOT DETECTED NOT DETECTED Final    Comment: (NOTE) The GeneXpert MRSA Assay (FDA approved for NASAL specimens only), is one component of a comprehensive MRSA colonization surveillance program. It is not intended to diagnose MRSA infection nor to guide or monitor treatment for MRSA infections. Test performance is not FDA approved in patients less than 87 years old. Performed at [facility], [place] [address] [place], [place] [address]     Radiology Studies: [facility] Chest Port 1 View  Result Date: 08/06/2022 CLINICAL DATA:  Seizure. EXAM: PORTABLE CHEST 1 VIEW COMPARISON:  None Available. FINDINGS: Lung volumes are low. The heart is normal in size for technique. Bronchovascular crowding versus vascular congestion. No confluent consolidation. No pneumothorax or large pleural effusion IMPRESSION: Low lung volumes with bronchovascular crowding versus vascular congestion.  Electronically Signed   By: Keith Rake M.D.   On: 08/06/2022 01:42   MR Brain W and Wo Contrast  Result Date: 08/06/2022 CLINICAL DATA:  Seizure EXAM: MRI HEAD WITHOUT AND WITH CONTRAST TECHNIQUE: Multiplanar, multiecho pulse sequences of the brain and surrounding structures were obtained without and with intravenous contrast. CONTRAST:  39mL GADAVIST GADOBUTROL 1 MMOL/ML IV SOLN COMPARISON:  05/08/2017 FINDINGS: Brain: No acute infarct, mass effect or extra-axial collection. No acute or chronic hemorrhage. 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 The midline structures are normal. Vascular: Major flow voids are preserved. Skull and upper cervical spine: Normal calvarium and skull base. Visualized upper cervical spine and soft tissues are normal. Sinuses/Orbits:No paranasal sinus fluid levels or advanced mucosal thickening. No mastoid or middle ear effusion. Normal orbits. IMPRESSION: 1. No acute intracranial abnormality. 2. 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 Electronically Signed   By: Ulyses Jarred M.D.   On: 08/06/2022 00:51   CT Head Wo Contrast  Result Date: 08/05/2022 CLINICAL DATA:  Recent seizure activity and fall with headaches and neck pain, initial encounter EXAM: CT HEAD WITHOUT CONTRAST CT CERVICAL SPINE WITHOUT CONTRAST TECHNIQUE: Multidetector CT imaging of the head and cervical spine was performed following the standard protocol without intravenous contrast. Multiplanar CT image reconstructions of the cervical spine were also generated. RADIATION DOSE REDUCTION: This exam was performed according to the departmental dose-optimization program which includes automated exposure control, adjustment of the mA and/or kV according to patient size and/or use of iterative reconstruction technique. COMPARISON:  04/17/2022 FINDINGS: CT HEAD FINDINGS Brain: Stable cystic encephalomalacia is noted in the  distribution of left middle cerebral artery unchanged from the prior exam. No hemorrhage, acute infarction or space-occupying mass lesion is noted. Vascular: No hyperdense vessel or unexpected calcification. Skull: Prior craniotomy on the left is again noted. Sinuses/Orbits: No acute finding. Other: None. CT CERVICAL SPINE FINDINGS Alignment: Within normal limits. Skull base and vertebrae: 7 cervical segments are well visualized. 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 This involves both the vertebral body and the posterior elements particularly on the right. No compression deformity is seen. No acute fracture or acute facet abnormality is noted. Soft tissues and spinal canal: Surrounding soft tissue structures are within normal limits. Upper chest: Visualized lung apices are unremarkable. 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 No acute abnormality noted. CT of  the cervical spine: No acute abnormality noted. 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 Electronically Signed   By: Alcide Clever M.D.   On: 08/05/2022 23:47   CT Cervical Spine Wo Contrast  Result Date: 08/05/2022 CLINICAL DATA:  Recent seizure activity and fall with headaches and neck pain, initial encounter EXAM: CT HEAD WITHOUT CONTRAST CT CERVICAL SPINE WITHOUT CONTRAST TECHNIQUE: Multidetector CT imaging of the head and cervical spine was performed following the standard protocol without intravenous contrast. Multiplanar CT image reconstructions of the cervical spine were also generated. RADIATION DOSE REDUCTION: This exam was performed according to the departmental dose-optimization program which includes automated exposure control, adjustment of the mA and/or kV according to patient size and/or use of iterative reconstruction technique. COMPARISON:  04/17/2022 FINDINGS: CT HEAD FINDINGS Brain: Stable cystic encephalomalacia is noted  in the distribution of left middle cerebral artery unchanged from the prior exam. No hemorrhage, acute infarction or space-occupying mass lesion is noted. Vascular: No hyperdense vessel or unexpected calcification. Skull: Prior craniotomy on the left is again noted. Sinuses/Orbits: No acute finding. Other: None. CT CERVICAL SPINE FINDINGS Alignment: Within normal limits. Skull base and vertebrae: 7 cervical segments are well visualized. 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 This involves both the vertebral body and the posterior elements particularly on the right. No compression deformity is seen. No acute fracture or acute facet abnormality is noted. Soft tissues and spinal canal: Surrounding soft tissue structures are within normal limits. Upper chest: Visualized lung apices are unremarkable. 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 No acute abnormality noted. CT of the cervical spine: No acute abnormality noted. 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 Electronically Signed   By: Alcide Clever M.D.   On: 08/05/2022 23:47      Ilan Kahrs T. Alecia Doi [facility]  If 7PM-7AM, please contact night-coverage [email or web address] 08/06/2022, 2:26 PM

## 2022-08-06 NOTE — ED Notes (Signed)
Pt wife reported the pt started contracting up his arm and had a seizure, when RN went in room, pt was having seizure, EDP at bedside. Ativan ordered, placed on non re breather. Pt then went into a post ictal state, initially, he was just pulling off his monitoring and trying to sit up and progressed into a very combative state, when pt became physically aggressive, trying to get up and out of bed, pt not able to follow commands. He pulled out out IV. Multiple staff members having to hold patient down for his safety. Security to bedside. Additional dose of 2 ativan given. Pt behavior did ceases and he became calm. Transported to CT with RN. On return to room, pt is groggy, arousal to touch, but not able to follow commands. EDP aware of pt current state. NRB removed, [place] placed, and tolerating well, Seizure pads remain in place.

## 2022-08-06 NOTE — ED Provider Notes (Signed)
Care assumed at 2330.  Patient with history of seizure disorder here for evaluation of recurrent seizures despite taking his medications.  He did have a second seizure during his ED stay.  Care assumed pending reevaluation and MRI brain.  MRI brain without acute abnormality.  Patient appears to be persistently postictal.  Dr. Wilford Corner came to evaluate the patient and that was along the emergency department.  Plan to admit to the medicine service for ongoing observation.  Will treat with additional Keppra load.  Medicine consulted for admission.   Tilden Fossa, MD 08/06/22 [phone]

## 2022-08-06 NOTE — ED Notes (Signed)
Pt went to mri with nurse/on monitor

## 2022-08-06 NOTE — Plan of Care (Signed)
  Problem: Activity: Goal: Risk for activity intolerance will decrease Outcome: Progressing   Problem: Safety: Goal: Ability to remain free from injury will improve Outcome: Progressing   

## 2022-08-06 NOTE — Progress Notes (Signed)
PT has been off telemetry monitor since approximately 1100. PT refuses to wear monitor and currently has transfer orders to med surg.

## 2022-08-06 NOTE — Consult Note (Addendum)
Neurology Consultation  Reason for Consult: Breakthrough seizure Referring Physician: Dr. Suezanne Jacquet  CC: Breakthrough seizure  History is obtained from: Chart  HPI: Ricky Humphrey is a 31 y.o. male past history of mild cerebral palsy, seizure disorder currently on oxcarbazepine and Keppra at home, paroxysmal atrial flutter, left arachnoid cyst status postcraniotomy and unsuccessful cyst resection-presented for evaluation of breakthrough seizure.  EMS was called after he had a seizure.  He became combative after it which is typical for him.  The wife was with him.  He complained of having a headache after that.  The ED provider spoke with family said they reported compliance to medications.  He was given his Keppra IV and had another seizure in the ER.  He was given benzos.  Remains pretty lethargic after the second seizure.  No family at bedside at this time.  Patient unable to provide reliable history at this time. I was called by the ED provider a few hours ago and I recommended that they watch him to make sure he is coming around as well as get an MRI to make sure that there is no stroke or other reason for him to have breakthrough seizures given he also has a history of paroxysmal atrial flutter.  MRI was completed-no acute stroke or acute findings.  Remained very lethargic about 3 to 4 hours after the seizure. BMP with mild elevation of creatinine to 1.3, CBC unremarkable. Carbamazepine level on arrival low at 2.6.  Keppra level has been drawn but not reported yet.  Glucose 76 Chart review of outpatient neurology notes concerning for off-and-on noncompliance concerns  ROS: Unable to obtain due to altered mental status.   Past Medical History:  Diagnosis Date   Arachnoid cyst    s/p drainage by craniotomy   Cerebral palsy (HCC)    mild   Epilepsy (HCC)    Headache    Paroxysmal atrial flutter (HCC)    brief, post sz on 09/22/11   Seizure (HCC)    Seizures (HCC)    last June  2020   Family History  Problem Relation Age of Onset   Migraines Mother    Diabetes Mother    Asthma Brother    Social History:   reports that he has never smoked. He has never used smokeless tobacco. He reports that he does not drink alcohol and does not use drugs.  Medications No current facility-administered medications for this encounter.  Current Outpatient Medications:    acetaminophen (TYLENOL) 325 MG tablet, Take 650 mg by mouth every 6 (six) hours as needed for headache., Disp: , Rfl:    levETIRAcetam (KEPPRA) 750 MG tablet, Take 2 tablets by mouth 2 times daily., Disp: 360 tablet, Rfl: 4   TEGRETOL-XR 200 MG 12 hr tablet, Take 3 tablets by mouth 2 times daily., Disp: 540 tablet, Rfl: 4   methocarbamol (ROBAXIN) 750 MG tablet, Take one tablet (750 mg dose) by mouth 3 (three) times a day as needed. (Patient not taking: Reported on 08/05/2022), Disp: 60 tablet, Rfl: 0   Exam: Current vital signs: BP (!) 109/56   Pulse 69   Temp 97.6 F (36.4 C)   Resp 17   Ht 6\' 3"  (1.905 m)   Wt 90.7 kg   SpO2 100%   BMI 25.00 kg/m  Vital signs in last 24 hours: Temp:  [97.6 F (36.4 C)] 97.6 F (36.4 C) (12/13 0033) Pulse Rate:  [62-152] 69 (12/13 0200) Resp:  [14-32] 17 (12/13 0130) BP: (  109-167)/(50-84) 109/56 (12/13 0200) SpO2:  [92 %-100 %] 100 % (12/13 0200) Weight:  [90.7 kg] 90.7 kg (12/12 2039) General: Lethargic and drowsy, does not open eyes to voice but participates with some parts of the exam as documented below. HEENT: Normocephalic atraumatic CVS: Regular rhythm and rate Respiratory breathing normally and saturating well on room air Abdomen nondistended nontender Neurological exam Lethargic and drowsy Does not open eyes but follows basic commands such as raising his arms and legs up and sticking his tongue out. Cranial nerves: Pupils equal round react light, extraocular movements appear unhindered, blinks to threat from both sides, face appears  symmetric. Motor examination with antigravity strength in bilateral upper extremities and wiggles both toes to commands. Sensation intact Coordination difficult to assess given his mentation Gait deferred  Labs I have reviewed labs in epic and the results pertinent to this consultation are:  CBC    Component Value Date/Time   WBC 9.4 08/05/2022 2050   RBC 4.43 08/05/2022 2050   HGB 14.5 08/05/2022 2050   HGB 14.5 01/23/2021 1350   HCT 42.9 08/05/2022 2050   HCT 42.5 01/23/2021 1350   PLT 233 08/05/2022 2050   PLT 242 01/23/2021 1350   MCV 96.8 08/05/2022 2050   MCV 94 01/23/2021 1350   MCH 32.7 08/05/2022 2050   MCHC 33.8 08/05/2022 2050   RDW 12.8 08/05/2022 2050   RDW 12.7 01/23/2021 1350   LYMPHSABS 1.7 04/17/2022 1753   LYMPHSABS 1.5 01/23/2021 1350   MONOABS 0.9 04/17/2022 1753   EOSABS 0.3 04/17/2022 1753   EOSABS 0.1 01/23/2021 1350   BASOSABS 0.1 04/17/2022 1753   BASOSABS 0.0 01/23/2021 1350    CMP     Component Value Date/Time   NA 136 08/05/2022 2050   NA 140 01/23/2021 1350   K 3.7 08/05/2022 2050   CL 106 08/05/2022 2050   CO2 21 (L) 08/05/2022 2050   GLUCOSE 76 08/05/2022 2050   BUN 10 08/05/2022 2050   BUN 11 01/23/2021 1350   CREATININE 1.33 (H) 08/05/2022 2050   CALCIUM 8.9 08/05/2022 2050   PROT 6.4 (L) 08/05/2022 2050   PROT 6.4 01/23/2021 1350   ALBUMIN 3.2 (L) 08/05/2022 2050   ALBUMIN 4.3 01/23/2021 1350   AST 35 08/05/2022 2050   ALT 24 08/05/2022 2050   ALKPHOS 50 08/05/2022 2050   BILITOT 0.8 08/05/2022 2050   BILITOT 0.3 01/23/2021 1350   GFRNONAA >60 08/05/2022 2050   GFRAA >60 05/05/2020 0830   Imaging I have reviewed the images obtained: MRI brain: IMPRESSION: 1. No acute intracranial abnormality. 2. 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  Assessment: 31 year old with a history of seizure disorder, paroxysmal atrial flutter, mild cerebral palsy presenting with seizure at home  and another seizure in the emergency room.  Examination consistent with postictal state and nonfocal. Possibly prolonged postictal multiple seizures as well as medications side effect but it would be prudent to monitor him in observation given multiple seizures in the last few hours and not yet returned back to baseline.  Impression: Breakthrough seizure  Recommendations: He has received carbamazepine 200 mg p.o. x 1 prior to the second seizure.  He also received Keppra 1 g IV around 11 PM. He was given Ativan 2 mg around that time of the second seizure as well. Give him an additional 2000 mg IV Keppra now. Till the time his mentation improves and is able to take p.o. medications, I would recommend continuing Keppra  1500 mg twice daily from 10 AM onwards. Once he is able to take p.o. medications-start him back on his Tegretol 600 mg twice daily.  He only received a 200 mg Tegretol dose, and his stepdown level is also subtherapeutic. Check UA chest x-ray and UDS. Maintain seizure precautions If the mentation does not improve in the next few hours, consider spot EEG. If mentation status improved, he can be discharged home on Keppra 1500 twice daily and Tegretol 600 mg twice daily. Outpatient neurology follow-up in 2 to 4 weeks Seizure precautions as below Stress compliance to medications Plan discussed with Dr. Pecola Leisure at the [facility], ER   -- Milon Dikes, MD Neurologist [facility] Pager: [phone]  SEIZURE PRECAUTIONS Per [facility] statutes, patients with seizures are not allowed to drive until they have been seizure-free for six months.   Use caution when using heavy equipment or power tools. Avoid working on ladders or at heights. Take showers instead of baths. Ensure the water temperature is not too high on the home water heater. Do not go swimming alone. Do not lock yourself in a room alone (i.e. bathroom). When caring for infants or small children, sit  down when holding, feeding, or changing them to minimize risk of injury to the child in the event you have a seizure. Maintain good sleep hygiene. Avoid alcohol.    If patient has another seizure, call 911 and bring them back to the ED if: A.  The seizure lasts longer than 5 minutes.      B.  The patient doesn't wake shortly after the seizure or has new problems such as difficulty seeing, speaking or moving following the seizure C.  The patient was injured during the seizure D.  The patient has a temperature over 102 F (39C) E.  The patient vomited during the seizure and now is having trouble breathing

## 2022-08-06 NOTE — Progress Notes (Signed)
  Transition of Care [facility]) Screening Note   Patient Details  Name: Ricky Humphrey Date of Birth: 10-28-1990   Transition of Care [facility]) CM/SW Contact:    Lavenia Atlas, RN Phone Number: 08/06/2022, 11:54 AM    Transition of Care Department [facility]) has reviewed patient and no TOC needs have been identified at this time. We will continue to monitor patient advancement through interdisciplinary progression rounds. If new patient transition needs arise, please place a TOC consult.

## 2022-08-07 ENCOUNTER — Encounter: Payer: Self-pay | Admitting: Diagnostic Neuroimaging

## 2022-08-07 ENCOUNTER — Encounter: Payer: Self-pay | Admitting: Student

## 2022-08-07 DIAGNOSIS — R7989 Other specified abnormal findings of blood chemistry: Secondary | ICD-10-CM

## 2022-08-07 LAB — COMPREHENSIVE METABOLIC PANEL
ALT: 29 U/L (ref 0–44)
AST: 64 U/L — ABNORMAL HIGH (ref 15–41)
Albumin: 2.9 g/dL — ABNORMAL LOW (ref 3.5–5.0)
Alkaline Phosphatase: 52 U/L (ref 38–126)
Anion gap: 5 (ref 5–15)
BUN: 10 mg/dL (ref 6–20)
CO2: 24 mmol/L (ref 22–32)
Calcium: 8.1 mg/dL — ABNORMAL LOW (ref 8.9–10.3)
Chloride: 109 mmol/L (ref 98–111)
Creatinine, Ser: 1.23 mg/dL (ref 0.61–1.24)
GFR, Estimated: >60 mL/min (ref >60)
Glucose, Bld: 85 mg/dL (ref 70–99)
Potassium: 3.7 mmol/L (ref 3.5–5.1)
Sodium: 138 mmol/L (ref 135–145)
Total Bilirubin: 0.8 mg/dL (ref 0.3–1.2)
Total Protein: 5.9 g/dL — ABNORMAL LOW (ref 6.5–8.1)

## 2022-08-07 LAB — CBC
HCT: 36.9 % — ABNORMAL LOW (ref 39.0–52.0)
Hemoglobin: 12.5 g/dL — ABNORMAL LOW (ref 13.0–17.0)
MCH: 32.5 pg (ref 26.0–34.0)
MCHC: 33.9 g/dL (ref 30.0–36.0)
MCV: 95.8 fL (ref 80.0–100.0)
Platelets: 212 10*3/uL (ref 150–400)
RBC: 3.85 MIL/uL — ABNORMAL LOW (ref 4.22–5.81)
RDW: 12.8 % (ref 11.5–15.5)
WBC: 8.5 10*3/uL (ref 4.0–10.5)
nRBC: 0 % (ref 0.0–0.2)

## 2022-08-07 LAB — CK: Total CK: 2217 U/L — ABNORMAL HIGH (ref 49–397)

## 2022-08-07 LAB — MAGNESIUM: Magnesium: 2.1 mg/dL (ref 1.7–2.4)

## 2022-08-07 LAB — PHOSPHORUS: Phosphorus: 3.3 mg/dL (ref 2.5–4.6)

## 2022-08-07 NOTE — Plan of Care (Signed)
  Problem: Safety: Goal: Non-violent Restraint(s) Outcome: Completed/Met   Problem: Education: Goal: Knowledge of General Education information will improve Description: Including pain rating scale, medication(s)/side effects and non-pharmacologic comfort measures Outcome: Completed/Met   Problem: Health Behavior/Discharge Planning: Goal: Ability to manage health-related needs will improve Outcome: Completed/Met   Problem: Clinical Measurements: Goal: Ability to maintain clinical measurements within normal limits will improve Outcome: Completed/Met Goal: Will remain free from infection Outcome: Completed/Met Goal: Diagnostic test results will improve Outcome: Completed/Met Goal: Respiratory complications will improve Outcome: Completed/Met Goal: Cardiovascular complication will be avoided Outcome: Completed/Met   Problem: Activity: Goal: Risk for activity intolerance will decrease Outcome: Completed/Met   Problem: Nutrition: Goal: Adequate nutrition will be maintained Outcome: Completed/Met   Problem: Coping: Goal: Level of anxiety will decrease Outcome: Completed/Met   Problem: Elimination: Goal: Will not experience complications related to bowel motility Outcome: Completed/Met Goal: Will not experience complications related to urinary retention Outcome: Completed/Met   Problem: Pain Managment: Goal: General experience of comfort will improve Outcome: Completed/Met   Problem: Safety: Goal: Ability to remain free from injury will improve Outcome: Completed/Met   Problem: Skin Integrity: Goal: Risk for impaired skin integrity will decrease Outcome: Completed/Met

## 2022-08-07 NOTE — Plan of Care (Signed)
°  Problem: Coping: °Goal: Level of anxiety will decrease °Outcome: Progressing °  °

## 2022-08-07 NOTE — Discharge Summary (Signed)
Physician Discharge Summary  Ricky Humphrey NFA:[id number] DOB: 1990/12/30 DOA: 08/05/2022  PCP: Pcp, No  Admit date: 08/05/2022 Discharge date: 08/07/2022 Admitted From: Home Disposition: Home Recommendations for Outpatient Follow-up:  Follow up with PCP and neurology in 1 to 2 weeks or sooner if needed Check CMP and CBC at follow-up Please follow up on the following pending results: None  Home Health: Not indicated Equipment/Devices: Not indicated  Discharge Condition: Stable CODE STATUS: Full code   Hospital course 31 year old M with PMH of mild cerebral palsy, arachnoid cyst s/p craniotomy and unsuccessful cyst resection, seizure disorder and marijuana use presenting with seizure and fall, and admitted for breakthrough seizure.   Lab work, CXR, CT head and C-spine and MRI brain without acute finding other than mild AKI.  UDS positive for benzo and marijuana.  Tegretol level low at 2.6.  Neurology consulted.   Patient had additional breakthrough seizure while in ED. He was given IV Ativan and IV Keppra.  Neurology recommended resuming home Keppra and Tegretol, and cleared patient for discharge once mental status improved and able to take p.o. safely.    The next day, confusion/postictal symptoms resolved.  Has not had further seizure but elevated CK and creatinine.  Started on rigorous IV fluid. On the day of discharge, CK improved some.  Renal function improved as well.  No further seizure.  He is discharged on home Keppra and Lamictal.  He was counseled on the importance of compliance with his medication.  Advised to refrain from marijuana.  Encouraged hydration and minimizing exertion or heavy lifting over the next few days.  Encouraged outpatient follow-up with PCP and/or neurology in 1 to 2 weeks or sooner if needed.  Patient has been provided with seizure precaution including not driving until she is seizure-free at least for 6 months or cleared by neurology.  See  individual problem list below for more.   Problems addressed during this hospitalization Principal Problem:   Breakthrough seizure (HCC) Active Problems:   History of atrial flutter   Cerebral palsy (HCC)   Marijuana abuse   Traumatic rhabdomyolysis, initial encounter (HCC)   Intracranial arachnoid cyst   Elevated serum creatinine AKI ruled out.             Vital signs Vitals:   08/06/22 1200 08/06/22 1702 08/06/22 2131 08/07/22 0654  BP: 115/63 135/75 132/81 130/82  Pulse: 64 63 63 (!) 53  Temp:  98.4 F (36.9 C) 98.6 F (37 C) 98.1 F (36.7 C)  Resp: 19 18 17 17   Height:      Weight:      SpO2: 100% 99% 100% 100%  TempSrc:      BMI (Calculated):         Discharge exam  GENERAL: No apparent distress.  Nontoxic. HEENT: MMM.  Vision and hearing grossly intact.  NECK: Supple.  No apparent JVD.  RESP:  No IWOB.  Fair aeration bilaterally. CVS:  RRR. Heart sounds normal.  ABD/GI/GU: BS+. Abd soft, NTND.  MSK/EXT:  Moves extremities. No apparent deformity. No edema.  SKIN: no apparent skin lesion or wound NEURO: Awake and alert. Oriented appropriately.  No apparent focal neuro deficit. PSYCH: Calm. Normal affect.   Discharge Instructions Discharge Instructions     Diet general   Complete by: As directed    Discharge instructions   Complete by: As directed    It has been a pleasure taking care of you!  You were hospitalized for breakthrough seizure.  It  is very important that you take your medications as prescribed and on time.  We recommend refraining from marijuana use and maintaining good hydration. Avoid exertion or heavy lifting for the next 4-5 days.  Please see your primary care doctor and neurologist in 1 to 2 weeks or sooner if needed.  SEIZURE PRECAUTIONS Per [facility] statutes, patients with seizures are not allowed to drive until they have been seizure-free for six months.    Use caution when using heavy equipment or power tools. Avoid  working on ladders or at heights. Take showers instead of baths. Ensure the water temperature is not too high on the home water heater. Do not go swimming alone. Do not lock yourself in a room alone (i.e. bathroom). When caring for infants or small children, sit down when holding, feeding, or changing them to minimize risk of injury to the child in the event you have a seizure. Maintain good sleep hygiene. Avoid alcohol.    If patient has another seizure, call 911 and bring them back to the ED if: A.  The seizure lasts longer than 5 minutes.      B.  The patient doesn't wake shortly after the seizure or has new problems such as difficulty seeing, speaking or moving following the seizure C.  The patient was injured during the seizure D.  The patient has a temperature over 102 F (39C) E.  The patient vomited during the seizure and now is having trouble breathing      Take care,   Increase activity slowly   Complete by: As directed       Allergies as of 08/07/2022   No Known Allergies      Medication List     TAKE these medications    acetaminophen 325 MG tablet Commonly known as: TYLENOL Take 650 mg by mouth every 6 (six) hours as needed for headache.   levETIRAcetam 750 MG tablet Commonly known as: KEPPRA Take 2 tablets by mouth 2 times daily.   methocarbamol 750 MG tablet Commonly known as: ROBAXIN Take one tablet (750 mg dose) by mouth 3 (three) times a day as needed.   TEGretol-XR 200 MG 12 hr tablet Generic drug: carbamazepine Take 3 tablets by mouth 2 times daily.        Consultations: Neurology  Procedures/Studies:   DG Chest Port 1 View  Result Date: 08/06/2022 CLINICAL DATA:  Seizure. EXAM: PORTABLE CHEST 1 VIEW COMPARISON:  None Available. FINDINGS: Lung volumes are low. The heart is normal in size for technique. Bronchovascular crowding versus vascular congestion. No confluent consolidation. No pneumothorax or large pleural effusion IMPRESSION: Low  lung volumes with bronchovascular crowding versus vascular congestion. Electronically Signed   By: Narda Rutherford M.D.   On: 08/06/2022 01:42   MR Brain W and Wo Contrast  Result Date: 08/06/2022 CLINICAL DATA:  Seizure EXAM: MRI HEAD WITHOUT AND WITH CONTRAST TECHNIQUE: Multiplanar, multiecho pulse sequences of the brain and surrounding structures were obtained without and with intravenous contrast. CONTRAST:  11mL GADAVIST GADOBUTROL 1 MMOL/ML IV SOLN COMPARISON:  05/08/2017 FINDINGS: Brain: No acute infarct, mass effect or extra-axial collection. No acute or chronic hemorrhage. 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 The midline structures are normal. Vascular: Major flow voids are preserved. Skull and upper cervical spine: Normal calvarium and skull base. Visualized upper cervical spine and soft tissues are normal. Sinuses/Orbits:No paranasal sinus fluid levels or advanced mucosal thickening. No mastoid or middle ear effusion. Normal  orbits. IMPRESSION: 1. No acute intracranial abnormality. 2. 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 Electronically Signed   By: Deatra Robinson M.D.   On: 08/06/2022 00:51   CT Head Wo Contrast  Result Date: 08/05/2022 CLINICAL DATA:  Recent seizure activity and fall with headaches and neck pain, initial encounter EXAM: CT HEAD WITHOUT CONTRAST CT CERVICAL SPINE WITHOUT CONTRAST TECHNIQUE: Multidetector CT imaging of the head and cervical spine was performed following the standard protocol without intravenous contrast. Multiplanar CT image reconstructions of the cervical spine were also generated. RADIATION DOSE REDUCTION: This exam was performed according to the departmental dose-optimization program which includes automated exposure control, adjustment of the mA and/or kV according to patient size and/or use of iterative reconstruction technique. COMPARISON:  04/17/2022 FINDINGS: CT HEAD  FINDINGS Brain: Stable cystic encephalomalacia is noted in the distribution of left middle cerebral artery unchanged from the prior exam. No hemorrhage, acute infarction or space-occupying mass lesion is noted. Vascular: No hyperdense vessel or unexpected calcification. Skull: Prior craniotomy on the left is again noted. Sinuses/Orbits: No acute finding. Other: None. CT CERVICAL SPINE FINDINGS Alignment: Within normal limits. Skull base and vertebrae: 7 cervical segments are well visualized. 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 This involves both the vertebral body and the posterior elements particularly on the right. No compression deformity is seen. No acute fracture or acute facet abnormality is noted. Soft tissues and spinal canal: Surrounding soft tissue structures are within normal limits. Upper chest: Visualized lung apices are unremarkable. 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 No acute abnormality noted. CT of the cervical spine: No acute abnormality noted. 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 Electronically Signed   By: Alcide Clever M.D.   On: 08/05/2022 23:47   CT Cervical Spine Wo Contrast  Result Date: 08/05/2022 CLINICAL DATA:  Recent seizure activity and fall with headaches and neck pain, initial encounter EXAM: CT HEAD WITHOUT CONTRAST CT CERVICAL SPINE WITHOUT CONTRAST TECHNIQUE: Multidetector CT imaging of the head and cervical spine was performed following the standard protocol without intravenous contrast. Multiplanar CT image reconstructions of the cervical spine were also generated. RADIATION DOSE REDUCTION: This exam was performed according to the departmental dose-optimization program which includes automated exposure control, adjustment of the mA and/or kV according to patient size and/or use of iterative reconstruction technique. COMPARISON:  04/17/2022 FINDINGS: CT  HEAD FINDINGS Brain: Stable cystic encephalomalacia is noted in the distribution of left middle cerebral artery unchanged from the prior exam. No hemorrhage, acute infarction or space-occupying mass lesion is noted. Vascular: No hyperdense vessel or unexpected calcification. Skull: Prior craniotomy on the left is again noted. Sinuses/Orbits: No acute finding. Other: None. CT CERVICAL SPINE FINDINGS Alignment: Within normal limits. Skull base and vertebrae: 7 cervical segments are well visualized. 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 This involves both the vertebral body and the posterior elements particularly on the right. No compression deformity is seen. No acute fracture or acute facet abnormality is noted. Soft tissues and spinal canal: Surrounding soft tissue structures are within normal limits. Upper chest: Visualized lung apices are unremarkable. 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 No acute abnormality noted. CT of the cervical spine: No acute abnormality noted. 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 Electronically Signed  By: Alcide Clever M.D.   On: 08/05/2022 23:47       The results of significant diagnostics from this hospitalization (including imaging, microbiology, ancillary and laboratory) are listed below for reference.     Microbiology: Recent Results (from the past 240 hour(s))  Resp panel by RT-PCR (RSV, Flu A&B, Covid) Anterior Nasal Swab     Status: None   Collection Time: 08/06/22  1:35 AM   Specimen: Anterior Nasal Swab  Result Value Ref Range Status   SARS Coronavirus 2 by RT PCR NEGATIVE NEGATIVE Final    Comment: (NOTE) SARS-CoV-2 target nucleic acids are NOT DETECTED.  The SARS-CoV-2 RNA is generally detectable in upper respiratory specimens during the acute phase of infection. The lowest concentration of SARS-CoV-2 viral copies this assay can detect  is 138 copies/mL. A negative result does not preclude SARS-Cov-2 infection and should not be used as the sole basis for treatment or other patient management decisions. A negative result may occur with  improper specimen collection/handling, submission of specimen other than nasopharyngeal swab, presence of viral mutation(s) within the areas targeted by this assay, and inadequate number of viral copies(<138 copies/mL). A negative result must be combined with clinical observations, patient history, and epidemiological information. The expected result is Negative.  Fact Sheet for Patients:  BloggerCourse.com  Fact Sheet for Healthcare Providers:  SeriousBroker.it  This test is no t yet approved or cleared by the [place] FDA and  has been authorized for detection and/or diagnosis of SARS-CoV-2 by FDA under an Emergency Use Authorization (EUA). This EUA will remain  in effect (meaning this test can be used) for the duration of the COVID-19 declaration under Section 564(b)(1) of the Act, 21 U.S.C.section 360bbb-3(b)(1), unless the authorization is terminated  or revoked sooner.       Influenza A by PCR NEGATIVE NEGATIVE Final   Influenza B by PCR NEGATIVE NEGATIVE Final    Comment: (NOTE) The Xpert Xpress SARS-CoV-2/FLU/RSV plus assay is intended as an aid in the diagnosis of influenza from Nasopharyngeal swab specimens and should not be used as a sole basis for treatment. Nasal washings and aspirates are unacceptable for Xpert Xpress SARS-CoV-2/FLU/RSV testing.  Fact Sheet for Patients: BloggerCourse.com  Fact Sheet for Healthcare Providers: SeriousBroker.it  This test is not yet approved or cleared by the [place] FDA and has been authorized for detection and/or diagnosis of SARS-CoV-2 by FDA under an Emergency Use Authorization (EUA). This EUA will remain in effect  (meaning this test can be used) for the duration of the COVID-19 declaration under Section 564(b)(1) of the Act, 21 U.S.C. section 360bbb-3(b)(1), unless the authorization is terminated or revoked.     Resp Syncytial Virus by PCR NEGATIVE NEGATIVE Final    Comment: (NOTE) Fact Sheet for Patients: BloggerCourse.com  Fact Sheet for Healthcare Providers: SeriousBroker.it  This test is not yet approved or cleared by the [place] FDA and has been authorized for detection and/or diagnosis of SARS-CoV-2 by FDA under an Emergency Use Authorization (EUA). This EUA will remain in effect (meaning this test can be used) for the duration of the COVID-19 declaration under Section 564(b)(1) of the Act, 21 U.S.C. section 360bbb-3(b)(1), unless the authorization is terminated or revoked.  Performed at [facility], [address] [place], [place] [address]   MRSA Next Gen by PCR, Nasal     Status: None   Collection Time: 08/06/22  8:36 AM   Specimen: Nasal Mucosa; Nasal Swab  Result Value Ref  Range Status   MRSA by PCR Next Gen NOT DETECTED NOT DETECTED Final    Comment: (NOTE) The GeneXpert MRSA Assay (FDA approved for NASAL specimens only), is one component of a comprehensive MRSA colonization surveillance program. It is not intended to diagnose MRSA infection nor to guide or monitor treatment for MRSA infections. Test performance is not FDA approved in patients less than 42 years old. Performed at [facility], [address] [place], [place] [address]      Labs:  CBC: Recent Labs  Lab 08/05/22 2050 08/07/22 0320  WBC 9.4 8.5  HGB 14.5 12.5*  HCT 42.9 36.9*  MCV 96.8 95.8  PLT 233 212   BMP &GFR Recent Labs  Lab 08/05/22 2050 08/06/22 1243 08/07/22 0320  NA 136 136 138  K 3.7 3.8 3.7  CL 106 106 109  CO2 21* 26 24  GLUCOSE 76 101* 85  BUN 10 9 10   CREATININE 1.33* 1.27*  1.23  CALCIUM 8.9 8.5* 8.1*  MG 2.1  --  2.1  PHOS  --  3.5 3.3   Estimated Creatinine Clearance: 105 mL/min (by C-G formula based on SCr of 1.23 mg/dL). Liver & Pancreas: Recent Labs  Lab 08/05/22 2050 08/06/22 1243 08/07/22 0320  AST 35  --  64*  ALT 24  --  29  ALKPHOS 50  --  52  BILITOT 0.8  --  0.8  PROT 6.4*  --  5.9*  ALBUMIN 3.2* 3.4* 2.9*   No results for input(s): "LIPASE", "AMYLASE" in the last 168 hours. No results for input(s): "AMMONIA" in the last 168 hours. Diabetic: No results for input(s): "HGBA1C" in the last 72 hours. No results for input(s): "GLUCAP" in the last 168 hours. Cardiac Enzymes: Recent Labs  Lab 08/06/22 1243 08/07/22 0320  CKTOTAL 2,436* 2,217*   No results for input(s): "PROBNP" in the last 8760 hours. Coagulation Profile: No results for input(s): "INR", "PROTIME" in the last 168 hours. Thyroid Function Tests: No results for input(s): "TSH", "T4TOTAL", "FREET4", "T3FREE", "THYROIDAB" in the last 72 hours. Lipid Profile: No results for input(s): "CHOL", "HDL", "LDLCALC", "TRIG", "CHOLHDL", "LDLDIRECT" in the last 72 hours. Anemia Panel: No results for input(s): "VITAMINB12", "FOLATE", "FERRITIN", "TIBC", "IRON", "RETICCTPCT" in the last 72 hours. Urine analysis:    Component Value Date/Time   COLORURINE YELLOW 08/06/2022 0405   APPEARANCEUR CLOUDY (A) 08/06/2022 0405   LABSPEC 1.024 08/06/2022 0405   PHURINE 5.0 08/06/2022 0405   GLUCOSEU NEGATIVE 08/06/2022 0405   HGBUR SMALL (A) 08/06/2022 0405   BILIRUBINUR NEGATIVE 08/06/2022 0405   KETONESUR NEGATIVE 08/06/2022 0405   PROTEINUR NEGATIVE 08/06/2022 0405   UROBILINOGEN 0.2 06/15/2013 1216   NITRITE NEGATIVE 08/06/2022 0405   LEUKOCYTESUR NEGATIVE 08/06/2022 0405   Sepsis Labs: Invalid input(s): "PROCALCITONIN", "LACTICIDVEN"   SIGNED:  08/08/2022, MD  [facility] 08/07/2022, 5:34 PM

## 2022-08-11 ENCOUNTER — Telehealth: Payer: Self-pay | Admitting: Diagnostic Neuroimaging

## 2022-08-11 NOTE — Telephone Encounter (Signed)
Pt wife is calling. Stated pt had two bad seizures last week . Stated there where something different about these seizures and said pt is requesting a appointment with Dr. Marjory Lies. I made pt appointment with Dr. Marjory Lies on 1/22 @ 3pm. Per DPR pt wife name is not on it.

## 2022-08-11 NOTE — Telephone Encounter (Signed)
Noted, pt went to ED regarding sz

## 2022-08-14 ENCOUNTER — Other Ambulatory Visit: Payer: Self-pay

## 2022-08-14 NOTE — Telephone Encounter (Addendum)
Patient's wife called in to see if we had any sooner openings with Dr. Marjory Lies. Let her know at this time patient is scheduled for Dr. Richrd Humbles next available date he is in the office and I have nothing sooner. Stated she is really concerned about him as he is having really bad muscle cramps and very tired all the time. I advised if she concerned about him and he needs to be seen sooner than we can get him in, she should have patient taken back to the ED, I also asked if he has followed up with primary care, she said the next available they could get him in was Tuesday. While reviewing patients chart and messages she stated she was receiving a call from patient's primary care and would have to give [place] a call back. FYI

## 2022-08-15 ENCOUNTER — Emergency Department (HOSPITAL_COMMUNITY)
Admission: EM | Admit: 2022-08-15 | Discharge: 2022-08-15 | Disposition: A | Discharge status: Home / Self Care | Payer: 59 | Attending: Emergency Medicine | Admitting: Emergency Medicine

## 2022-08-15 ENCOUNTER — Encounter (HOSPITAL_COMMUNITY): Payer: Self-pay

## 2022-08-15 ENCOUNTER — Emergency Department (HOSPITAL_COMMUNITY): Payer: 59

## 2022-08-15 DIAGNOSIS — R079 Chest pain, unspecified: Secondary | ICD-10-CM | POA: Diagnosis present

## 2022-08-15 DIAGNOSIS — Z20822 Contact with and (suspected) exposure to covid-19: Secondary | ICD-10-CM | POA: Diagnosis not present

## 2022-08-15 DIAGNOSIS — R0602 Shortness of breath: Secondary | ICD-10-CM | POA: Diagnosis not present

## 2022-08-15 DIAGNOSIS — R0789 Other chest pain: Secondary | ICD-10-CM | POA: Insufficient documentation

## 2022-08-15 LAB — COMPREHENSIVE METABOLIC PANEL
ALT: 26 U/L (ref 0–44)
AST: 22 U/L (ref 15–41)
Albumin: 3.5 g/dL (ref 3.5–5.0)
Alkaline Phosphatase: 57 U/L (ref 38–126)
Anion gap: 9 (ref 5–15)
BUN: 12 mg/dL (ref 6–20)
CO2: 24 mmol/L (ref 22–32)
Calcium: 8.8 mg/dL — ABNORMAL LOW (ref 8.9–10.3)
Chloride: 103 mmol/L (ref 98–111)
Creatinine, Ser: 1.03 mg/dL (ref 0.61–1.24)
GFR, Estimated: >60 mL/min (ref >60)
Glucose, Bld: 104 mg/dL — ABNORMAL HIGH (ref 70–99)
Potassium: 4.1 mmol/L (ref 3.5–5.1)
Sodium: 136 mmol/L (ref 135–145)
Total Bilirubin: 0.5 mg/dL (ref 0.3–1.2)
Total Protein: 7.4 g/dL (ref 6.5–8.1)

## 2022-08-15 LAB — CBC WITH DIFFERENTIAL/PLATELET
Abs Immature Granulocytes: 0.06 10*3/uL (ref 0.00–0.07)
Basophils Absolute: 0.1 10*3/uL (ref 0.0–0.1)
Basophils Relative: 0 %
Eosinophils Absolute: 0.1 10*3/uL (ref 0.0–0.5)
Eosinophils Relative: 1 %
HCT: 43.2 % (ref 39.0–52.0)
Hemoglobin: 15 g/dL (ref 13.0–17.0)
Immature Granulocytes: 0 %
Lymphocytes Relative: 12 %
Lymphs Abs: 1.6 10*3/uL (ref 0.7–4.0)
MCH: 32.8 pg (ref 26.0–34.0)
MCHC: 34.7 g/dL (ref 30.0–36.0)
MCV: 94.3 fL (ref 80.0–100.0)
Monocytes Absolute: 1.9 10*3/uL — ABNORMAL HIGH (ref 0.1–1.0)
Monocytes Relative: 14 %
Neutro Abs: 9.9 10*3/uL — ABNORMAL HIGH (ref 1.7–7.7)
Neutrophils Relative %: 73 %
Platelets: 236 10*3/uL (ref 150–400)
RBC: 4.58 MIL/uL (ref 4.22–5.81)
RDW: 13 % (ref 11.5–15.5)
WBC: 13.6 10*3/uL — ABNORMAL HIGH (ref 4.0–10.5)
nRBC: 0 % (ref 0.0–0.2)

## 2022-08-15 LAB — RESP PANEL BY RT-PCR (RSV, FLU A&B, COVID)  RVPGX2
Influenza A by PCR: NEGATIVE
Influenza B by PCR: NEGATIVE
Resp Syncytial Virus by PCR: NEGATIVE
SARS Coronavirus 2 by RT PCR: NEGATIVE

## 2022-08-15 LAB — CK: Total CK: 183 U/L (ref 49–397)

## 2022-08-15 LAB — TROPONIN I (HIGH SENSITIVITY): Troponin I (High Sensitivity): 4 ng/L (ref <18)

## 2022-08-15 MED ORDER — METHOCARBAMOL 500 MG PO TABS: 500.0000 mg | ORAL_TABLET | Freq: Two times a day (BID) | ORAL | 0 refills | Status: DC

## 2022-08-15 MED ORDER — ALBUTEROL SULFATE (5 MG/ML) 0.5% IN NEBU: 2.5000 mg | INHALATION_SOLUTION | Freq: Four times a day (QID) | RESPIRATORY_TRACT | 12 refills | Status: DC | PRN

## 2022-08-15 MED ORDER — KETOROLAC TROMETHAMINE 30 MG/ML IJ SOLN: 15.0000 mg | Freq: Once | INTRAMUSCULAR | Status: DC

## 2022-08-15 MED ORDER — KETOROLAC TROMETHAMINE 30 MG/ML IJ SOLN
15.0000 mg | Freq: Once | INTRAMUSCULAR | Status: AC
  Administered 2022-08-15: 15 mg via INTRAVENOUS
  Filled 2022-08-15: qty 1

## 2022-08-15 MED ORDER — IPRATROPIUM-ALBUTEROL 0.5-2.5 (3) MG/3ML IN SOLN
3.0000 mL | Freq: Once | RESPIRATORY_TRACT | Status: AC
  Administered 2022-08-15: 3 mL via RESPIRATORY_TRACT
  Filled 2022-08-15: qty 3

## 2022-08-15 MED ORDER — IBUPROFEN 600 MG PO TABS: 600.0000 mg | ORAL_TABLET | Freq: Four times a day (QID) | ORAL | 0 refills | Status: DC | PRN

## 2022-08-15 MED ORDER — SODIUM CHLORIDE 0.9 % IV BOLUS
1000.0000 mL | Freq: Once | INTRAVENOUS | Status: AC
  Administered 2022-08-15: 1000 mL via INTRAVENOUS

## 2022-08-15 NOTE — Discharge Instructions (Signed)
Note the workup today was overall reassuring.  As we discussed, pain most likely secondary to musculoskeletal issue.  No evidence of acute fracture today.  No evidence of collapsed lung or other cardiopulmonary abnormality.  Recommend continue therapy at home with NSAIDs in the form of ibuprofen/Aleve, breathing treatment with albuterol as well as muscle laxer to use as needed.  Recommend follow-up with primary care for reevaluation of your symptoms.  Please not hesitate to return to emergency department for worrisome signs and symptoms we discussed become apparent.

## 2022-08-15 NOTE — ED Provider Notes (Signed)
[facility]-EMERGENCY DEPT Provider Note   CSN: [id number] Arrival date & time: 08/15/22  5956     History  Chief Complaint  Patient presents with   Shortness of Breath    Ricky Humphrey is a 31 y.o. male.   Shortness of Breath   31 year old male presents emergency department with complaints of right-sided chest pain as well as shortness of breath.  Patient noted chest pain beginning last night when he was reaching to go to bed he noticed sharp lower rib pain with subsequent shortness of breath.  He reports persistence of symptoms since onset.  Has taken no medication for this.  Reports history of asthma but has not been on inhalers in nature.  Denies history of similar symptoms.  States that he was recently at the hospital for back-to-back seizures with elevated CK of greater than 2000; he states that since discharge, he has had continued diffuse myalgias despite adequate oral intake at home.  He is requesting rechecking CK.  He reports a family at home with cough, runny nose of which he has beginning of mild symptoms.  Denies fever, chills, night sweats, abdominal pain, nausea, vomiting, urinary symptoms, change in bowel habits.  Denies cocaine use.  Past medical history significant for seizure, paroxysmal atrial flutter, cerebral palsy  Home Medications Prior to Admission medications   Medication Sig Start Date End Date Taking? Authorizing Provider  albuterol (PROVENTIL) (5 MG/ML) 0.5% nebulizer solution Take 0.5 mLs (2.5 mg total) by nebulization every 6 (six) hours as needed for wheezing or shortness of breath. 08/15/22  Yes Sherian Maroon A, PA  ibuprofen (ADVIL) 600 MG tablet Take 1 tablet (600 mg total) by mouth every 6 (six) hours as needed. 08/15/22  Yes Sherian Maroon A, PA  methocarbamol (ROBAXIN) 500 MG tablet Take 1 tablet (500 mg total) by mouth 2 (two) times daily. 08/15/22  Yes Sherian Maroon A, PA  acetaminophen (TYLENOL) 325 MG tablet  Take 650 mg by mouth every 6 (six) hours as needed for headache.    [provider]  levETIRAcetam (KEPPRA) 750 MG tablet Take 2 tablets by mouth 2 times daily. 01/22/22 04/17/23  Penumalli, Glenford Bayley, MD  TEGRETOL-XR 200 MG 12 hr tablet Take 3 tablets by mouth 2 times daily. 01/25/22 04/20/23  Sater, Pearletha Furl, MD      Allergies    Patient has no known allergies.    Review of Systems   Review of Systems  Respiratory:  Positive for shortness of breath.   All other systems reviewed and are negative.   Physical Exam Updated Vital Signs BP 135/74 (BP Location: Left Arm)   Pulse 83   Temp 99.3 F (37.4 C) (Oral)   Resp 20   SpO2 99%  Physical Exam Vitals and nursing note reviewed.  Constitutional:      General: He is not in acute distress.    Appearance: He is well-developed.  HENT:     Head: Normocephalic and atraumatic.  Eyes:     Conjunctiva/sclera: Conjunctivae normal.  Cardiovascular:     Rate and Rhythm: Normal rate and regular rhythm.     Heart sounds: No murmur heard. Pulmonary:     Effort: Pulmonary effort is normal. No respiratory distress.     Comments: Wheeze/rhonchi auscultated mildly throughout lung fields.  Breath sounds auscultated bilaterally.  Patient has tenderness to palpation of right lower ribs with no obvious step-off or deformity or crepitus appreciated. Abdominal:     Palpations: Abdomen is  soft.     Tenderness: There is no abdominal tenderness.  Musculoskeletal:        General: No swelling.     Cervical back: Neck supple.     Right lower leg: No edema.     Left lower leg: No edema.  Skin:    General: Skin is warm and dry.     Capillary Refill: Capillary refill takes less than 2 seconds.  Neurological:     Mental Status: He is alert.  Psychiatric:        Mood and Affect: Mood normal.     ED Results / Procedures / Treatments   Labs (all labs ordered are listed, but only abnormal results are displayed) Labs Reviewed  COMPREHENSIVE  METABOLIC PANEL - Abnormal; Notable for the following components:      Result Value   Glucose, Bld 104 (*)    Calcium 8.8 (*)    All other components within normal limits  CBC WITH DIFFERENTIAL/PLATELET - Abnormal; Notable for the following components:   WBC 13.6 (*)    Neutro Abs 9.9 (*)    Monocytes Absolute 1.9 (*)    All other components within normal limits  RESP PANEL BY RT-PCR (RSV, FLU A&B, COVID)  RVPGX2  CK  URINALYSIS, ROUTINE W REFLEX MICROSCOPIC  TROPONIN I (HIGH SENSITIVITY)  TROPONIN I (HIGH SENSITIVITY)    EKG None  Radiology DG Chest 2 View  Result Date: 08/15/2022 CLINICAL DATA:  SOB EXAM: CHEST - 2 VIEW COMPARISON:  Fall 132023 FINDINGS: The heart size and mediastinal contours are within normal limits. Both lungs are clear. The visualized skeletal structures are unremarkable. IMPRESSION: No active cardiopulmonary disease. Electronically Signed   By: Sammie Bench M.D.   On: 08/15/2022 09:57    Procedures Procedures    Medications Ordered in ED Medications  ipratropium-albuterol (DUONEB) 0.5-2.5 (3) MG/3ML nebulizer solution 3 mL (3 mLs Nebulization Given 08/15/22 0944)  sodium chloride 0.9 % bolus 1,000 mL (1,000 mLs Intravenous New Bag/Given 08/15/22 0948)  ketorolac (TORADOL) 30 MG/ML injection 15 mg (15 mg Intravenous Given 08/15/22 0931)    ED Course/ Medical Decision Making/ A&P                           Medical Decision Making  This patient presents to the ED for concern of shortness of breath, this involves an extensive number of treatment options, and is a complaint that carries with it a high risk of complications and morbidity.  The differential diagnosis includes The causes for shortness of breath include but are not limited to Cardiac (AHF, pericardial effusion and tamponade, arrhythmias, ischemia, etc) Respiratory (COPD, asthma, pneumonia, pneumothorax, primary pulmonary hypertension, PE/VQ mismatch) Hematological (anemia)  Co  morbidities that complicate the patient evaluation  See HPI   Additional history obtained:  Additional history obtained from EMR External records from outside source obtained and reviewed including hospital records   Lab Tests:  I Ordered, and personally interpreted labs.  The pertinent results include: Leukocytosis of 13.6 without evidence of acute bacterial infection today; patient eliciting signs of viral URI given nasal congestion, cough but no clear evidence of pneumonia and negative respiratory viral panel.   No electrolyte abnormalities noted.  No transaminitis.  No renal dysfunction.  Initial troponin of 4 with EKG concerning for sinus rhythm with no acute ischemic changes from prior EKGs performed.   Imaging Studies ordered:  I ordered imaging studies including chest x-ray I independently visualized  and interpreted imaging which showed no acute cardiopulmonary abnormality I agree with the radiologist interpretation   Cardiac Monitoring: / EKG:  The patient was maintained on a cardiac monitor.  I personally viewed and interpreted the cardiac monitored which showed an underlying rhythm of: Sinus rhythm with no acute ischemic changes from prior EKG performed.   Consultations Obtained:  N/a   Problem List / ED Course / Critical interventions / Medication management  Shortness of breath I ordered medication including albuterol inhaler/Toradol   Reevaluation of the patient after these medicines showed that the patient improved I have reviewed the patients home medicines and have made adjustments as needed   Social Determinants of Health:  Denies tobacco, illicit drug use   Test / Admission - Considered:  Shortness of breath/chest wall pain Vitals signs within normal range and stable throughout visit. Laboratory/imaging studies significant for: See above Patient's chest pain seems more associated with chest wall pain given area of tenderness, HPI findings.   Patient noted significant improvement with administration of Toradol while in the emergency department.  Recommend continued outpatient therapy of the same class of medications.  Patient also elicited mild wheeze/rhonchi of which improved significantly with albuterol administered while in the emergency department.  Patient has at home nebulizer of which refills were given.  Doubt ACS.  Doubt PE given PERC negative.  Doubt CHF exacerbation.  Doubt tamponade, aortic dissection, pericarditis/myocarditis.  Doubt pneumonia.  Patient recommended follow-up with primary care for reevaluation of symptoms.  Treatment plan discussed length with patient and he acknowledged understanding was agreeable to said plan. Worrisome signs and symptoms were discussed with the patient, and the patient acknowledged understanding to return to the ED if noticed. Patient was stable upon discharge. '        Final Clinical Impression(s) / ED Diagnoses Final diagnoses:  Chest wall pain  SOB (shortness of breath)    Rx / DC Orders ED Discharge Orders          Ordered    methocarbamol (ROBAXIN) 500 MG tablet  2 times daily        08/15/22 1034    ibuprofen (ADVIL) 600 MG tablet  Every 6 hours PRN        08/15/22 1034    albuterol (PROVENTIL) (5 MG/ML) 0.5% nebulizer solution  Every 6 hours PRN        08/15/22 1034              Wilnette Kales, [place] 08/15/22 1042    Fredia Sorrow, MD 08/18/22 1709

## 2022-08-15 NOTE — ED Notes (Addendum)
Pt to XR

## 2022-08-15 NOTE — ED Triage Notes (Signed)
Pt arrived via POV, c/o SOB, left sided pain under rib area. Denies any chest pain.

## 2022-08-16 ENCOUNTER — Observation Stay (HOSPITAL_COMMUNITY)
Admission: EM | Admit: 2022-08-16 | Discharge: 2022-08-19 | Disposition: A | Discharge status: Home / Self Care | Payer: 59 | Attending: Family Medicine | Admitting: Family Medicine

## 2022-08-16 ENCOUNTER — Encounter (HOSPITAL_COMMUNITY): Payer: Self-pay | Admitting: Internal Medicine

## 2022-08-16 ENCOUNTER — Emergency Department (HOSPITAL_COMMUNITY): Payer: 59

## 2022-08-16 ENCOUNTER — Other Ambulatory Visit: Payer: Self-pay

## 2022-08-16 DIAGNOSIS — D72829 Elevated white blood cell count, unspecified: Secondary | ICD-10-CM | POA: Diagnosis not present

## 2022-08-16 DIAGNOSIS — Z23 Encounter for immunization: Secondary | ICD-10-CM | POA: Diagnosis not present

## 2022-08-16 DIAGNOSIS — I2699 Other pulmonary embolism without acute cor pulmonale: Secondary | ICD-10-CM | POA: Diagnosis not present

## 2022-08-16 DIAGNOSIS — Z79899 Other long term (current) drug therapy: Secondary | ICD-10-CM | POA: Insufficient documentation

## 2022-08-16 DIAGNOSIS — R936 Abnormal findings on diagnostic imaging of limbs: Secondary | ICD-10-CM | POA: Diagnosis present

## 2022-08-16 DIAGNOSIS — M25561 Pain in right knee: Secondary | ICD-10-CM | POA: Diagnosis not present

## 2022-08-16 DIAGNOSIS — R569 Unspecified convulsions: Secondary | ICD-10-CM

## 2022-08-16 DIAGNOSIS — Z8679 Personal history of other diseases of the circulatory system: Secondary | ICD-10-CM

## 2022-08-16 DIAGNOSIS — R0602 Shortness of breath: Secondary | ICD-10-CM | POA: Diagnosis present

## 2022-08-16 DIAGNOSIS — G809 Cerebral palsy, unspecified: Secondary | ICD-10-CM | POA: Diagnosis present

## 2022-08-16 LAB — CBC WITH DIFFERENTIAL/PLATELET
Abs Immature Granulocytes: 0.07 10*3/uL (ref 0.00–0.07)
Basophils Absolute: 0.1 10*3/uL (ref 0.0–0.1)
Basophils Relative: 0 %
Eosinophils Absolute: 0 10*3/uL (ref 0.0–0.5)
Eosinophils Relative: 0 %
HCT: 42 % (ref 39.0–52.0)
Hemoglobin: 14.2 g/dL (ref 13.0–17.0)
Immature Granulocytes: 0 %
Lymphocytes Relative: 8 %
Lymphs Abs: 1.3 10*3/uL (ref 0.7–4.0)
MCH: 32.1 pg (ref 26.0–34.0)
MCHC: 33.8 g/dL (ref 30.0–36.0)
MCV: 94.8 fL (ref 80.0–100.0)
Monocytes Absolute: 2.1 10*3/uL — ABNORMAL HIGH (ref 0.1–1.0)
Monocytes Relative: 13 %
Neutro Abs: 13.1 10*3/uL — ABNORMAL HIGH (ref 1.7–7.7)
Neutrophils Relative %: 79 %
Platelets: 240 10*3/uL (ref 150–400)
RBC: 4.43 MIL/uL (ref 4.22–5.81)
RDW: 13 % (ref 11.5–15.5)
WBC: 16.6 10*3/uL — ABNORMAL HIGH (ref 4.0–10.5)
nRBC: 0 % (ref 0.0–0.2)

## 2022-08-16 LAB — COMPREHENSIVE METABOLIC PANEL
ALT: 24 U/L (ref 0–44)
AST: 19 U/L (ref 15–41)
Albumin: 3.8 g/dL (ref 3.5–5.0)
Alkaline Phosphatase: 67 U/L (ref 38–126)
Anion gap: 7 (ref 5–15)
BUN: 14 mg/dL (ref 6–20)
CO2: 24 mmol/L (ref 22–32)
Calcium: 8.9 mg/dL (ref 8.9–10.3)
Chloride: 105 mmol/L (ref 98–111)
Creatinine, Ser: 1.09 mg/dL (ref 0.61–1.24)
GFR, Estimated: >60 mL/min (ref >60)
Glucose, Bld: 104 mg/dL — ABNORMAL HIGH (ref 70–99)
Potassium: 4.2 mmol/L (ref 3.5–5.1)
Sodium: 136 mmol/L (ref 135–145)
Total Bilirubin: 0.8 mg/dL (ref 0.3–1.2)
Total Protein: 8.3 g/dL — ABNORMAL HIGH (ref 6.5–8.1)

## 2022-08-16 LAB — LIPASE, BLOOD: Lipase: 28 U/L (ref 11–51)

## 2022-08-16 LAB — TROPONIN I (HIGH SENSITIVITY): Troponin I (High Sensitivity): 5 ng/L (ref <18)

## 2022-08-16 LAB — CK: Total CK: 140 U/L (ref 49–397)

## 2022-08-16 MED ORDER — IOHEXOL 350 MG/ML SOLN
100.0000 mL | Freq: Once | INTRAVENOUS | Status: AC | PRN
  Administered 2022-08-16: 100 mL via INTRAVENOUS

## 2022-08-16 MED ORDER — MORPHINE SULFATE (PF) 4 MG/ML IV SOLN
4.0000 mg | Freq: Once | INTRAVENOUS | Status: AC
  Administered 2022-08-16: 4 mg via INTRAVENOUS
  Filled 2022-08-16: qty 1

## 2022-08-16 MED ORDER — ENOXAPARIN SODIUM 100 MG/ML IJ SOSY
1.0000 mg/kg | PREFILLED_SYRINGE | Freq: Two times a day (BID) | INTRAMUSCULAR | Status: DC
  Administered 2022-08-16 – 2022-08-19 (×6): 92.5 mg via SUBCUTANEOUS
  Filled 2022-08-16 (×6): qty 1

## 2022-08-16 MED ORDER — ACETAMINOPHEN 650 MG RE SUPP: 650.0000 mg | Freq: Four times a day (QID) | RECTAL | Status: DC | PRN

## 2022-08-16 MED ORDER — PNEUMOCOCCAL 20-VAL CONJ VACC 0.5 ML IM SUSY
0.5000 mL | PREFILLED_SYRINGE | INTRAMUSCULAR | Status: DC
  Filled 2022-08-16: qty 0.5

## 2022-08-16 MED ORDER — ACETAMINOPHEN 325 MG PO TABS: 650.0000 mg | ORAL_TABLET | Freq: Four times a day (QID) | ORAL | Status: DC | PRN

## 2022-08-16 MED ORDER — OXYCODONE-ACETAMINOPHEN 5-325 MG PO TABS
1.0000 | ORAL_TABLET | Freq: Once | ORAL | Status: AC
  Administered 2022-08-16: 1 via ORAL
  Filled 2022-08-16: qty 1

## 2022-08-16 MED ORDER — CARBAMAZEPINE ER 200 MG PO TB12
600.0000 mg | ORAL_TABLET | Freq: Two times a day (BID) | ORAL | Status: DC
  Administered 2022-08-16 – 2022-08-19 (×7): 600 mg via ORAL
  Filled 2022-08-16 (×8): qty 3

## 2022-08-16 MED ORDER — OXYCODONE HCL 5 MG PO TABS
5.0000 mg | ORAL_TABLET | ORAL | Status: DC | PRN
  Administered 2022-08-16: 5 mg via ORAL
  Filled 2022-08-16: qty 1

## 2022-08-16 MED ORDER — ALBUTEROL SULFATE HFA 108 (90 BASE) MCG/ACT IN AERS
2.0000 | INHALATION_SPRAY | RESPIRATORY_TRACT | Status: DC | PRN
  Administered 2022-08-16: 2 via RESPIRATORY_TRACT
  Filled 2022-08-16: qty 6.7

## 2022-08-16 MED ORDER — MORPHINE SULFATE (PF) 4 MG/ML IV SOLN
4.0000 mg | INTRAVENOUS | Status: AC | PRN
  Administered 2022-08-16 – 2022-08-17 (×3): 4 mg via INTRAVENOUS
  Filled 2022-08-16 (×3): qty 1

## 2022-08-16 MED ORDER — SODIUM CHLORIDE 0.9 % IV BOLUS
1000.0000 mL | Freq: Once | INTRAVENOUS | Status: AC
  Administered 2022-08-16: 1000 mL via INTRAVENOUS

## 2022-08-16 MED ORDER — ENOXAPARIN SODIUM 100 MG/ML IJ SOSY
1.0000 mg/kg | PREFILLED_SYRINGE | Freq: Once | INTRAMUSCULAR | Status: AC
  Administered 2022-08-16: 92.5 mg via SUBCUTANEOUS
  Filled 2022-08-16: qty 0.93

## 2022-08-16 MED ORDER — PROCHLORPERAZINE EDISYLATE 10 MG/2ML IJ SOLN: 5.0000 mg | INTRAMUSCULAR | Status: DC | PRN

## 2022-08-16 MED ORDER — ONDANSETRON HCL 4 MG/2ML IJ SOLN
4.0000 mg | Freq: Once | INTRAMUSCULAR | Status: AC
  Administered 2022-08-16: 4 mg via INTRAVENOUS
  Filled 2022-08-16: qty 2

## 2022-08-16 MED ORDER — LEVETIRACETAM 500 MG PO TABS
1500.0000 mg | ORAL_TABLET | Freq: Two times a day (BID) | ORAL | Status: DC
  Administered 2022-08-16 – 2022-08-19 (×7): 1500 mg via ORAL
  Filled 2022-08-16 (×7): qty 3

## 2022-08-16 MED ORDER — ALBUTEROL SULFATE (2.5 MG/3ML) 0.083% IN NEBU: 2.5000 mg | INHALATION_SOLUTION | RESPIRATORY_TRACT | Status: DC | PRN

## 2022-08-16 NOTE — ED Notes (Signed)
Pt. Stated that he can not lay on his back to do his EKG and he don't want to move

## 2022-08-16 NOTE — H&P (Signed)
History and Physical    Patient: Ricky Humphrey DOB: July 01, 1991 DOA: 08/16/2022 DOS: the patient was seen and examined on 08/16/2022 PCP: Pcp, No  Patient coming from: Home  Chief Complaint:  Chief Complaint  Patient presents with   Shortness of Breath   HPI: Ricky Humphrey is a 31 y.o. male with medical history significant of arachnoid cyst with drainage by craniotomy, mild cerebral palsy, epilepsy, headache, paroxysmal atrial flutter, chronic posttraumatic headaches, cannabis abuse, traumatic rhabdomyolysis who is coming to the emergency department due to right lower chest wall pleuritic pain associated with dyspnea since late evening on 08/14/2022.  He has been having bilateral lower extremity spasms as well.  He has been on light duty since he had a knee injury at the beginning of November.  He has been staying in bed or sitting down for a lot of time during the day. He denied fever, chills, rhinorrhea, sore throat, wheezing or hemoptysis.  No chest pain, palpitations, diaphoresis, PND, orthopnea or pitting edema of the lower extremities.  No abdominal pain, nausea, emesis, diarrhea, constipation, melena or hematochezia.  No flank pain, dysuria, frequency or hematuria.  No polyuria, polydipsia, polyphagia or blurred vision.   ED course: Initial vital signs were temperature 99.8 F, pulse 85 respiration 16, BP 125/90 mmHg O2 sat 100% on room air.  The patient received morphine 4 mg IVP x 2, ondansetron 4 mg IVP x 1 Percocet 5/325 1 tablet p.o. x 1, enoxaparin 92.5 mg SQ x 1 and 1000 mL of normal saline bolus.  Lab work: His CBC showed a white count of 16.6, hemoglobin 14.2 g/dL and platelets 737.  Lipase was in total CK 140 28 units/L.  Troponin was normal.  CMP with a glucose of 104 mg/dL and total protein 8.3 g/dL, the rest of the CMP values are normal.  Imaging: Chest radiograph done yesterday with no active cardiopulmonary disease.  CTA chest/abdomen/pelvis  done today with acute PE within the posterior right lower lobar and segmental pulmonary arteries.  Groundglass and airspace disease within the posterior right base is favored to represent a pulmonary infarct.  No evidence for aortic dissection or aneurysm.  No acute findings within the abdomen or pelvis.  There is a 3 mm right middle lobe lung.  No specific recommendations for follow-up.  Right knee x-rays done on 06/30/2022 did not show any acute findings.  However there were 2 ossified loose bodies in the posterolateral joint space without obvious bone or site.  There was a small joint effusion.  MRI of the knee was recommended.   Review of Systems: As mentioned in the history of present illness. All other systems reviewed and are negative.  Past Medical History:  Diagnosis Date   Arachnoid cyst    s/p drainage by craniotomy   Cerebral palsy (HCC)    mild   Epilepsy (HCC)    Headache    Paroxysmal atrial flutter (HCC)    brief, post sz on 09/22/11   Seizure (HCC)    Seizures (HCC)    last June 2020   Past Surgical History:  Procedure Laterality Date   acrnoid cyst     HAND SURGERY  07/2019   fracture   TONSILLECTOMY AND ADENOIDECTOMY     Social History:  reports that he has never smoked. He has never used smokeless tobacco. He reports that he does not drink alcohol and does not use drugs.  No Known Allergies  Family History  Problem Relation Age of  Onset   Migraines Mother    Diabetes Mother    Asthma Brother     Prior to Admission medications   Medication Sig Start Date End Date Taking? Authorizing Provider  acetaminophen (TYLENOL) 325 MG tablet Take 650 mg by mouth every 6 (six) hours as needed for headache.    [provider]  albuterol (PROVENTIL) (5 MG/ML) 0.5% nebulizer solution Take 0.5 mLs (2.5 mg total) by nebulization every 6 (six) hours as needed for wheezing or shortness of breath. 08/15/22   Peter Garter, PA  ibuprofen (ADVIL) 600 MG tablet Take 1  tablet (600 mg total) by mouth every 6 (six) hours as needed. 08/15/22   Peter Garter, PA  levETIRAcetam (KEPPRA) 750 MG tablet Take 2 tablets by mouth 2 times daily. 01/22/22 04/17/23  Penumalli, Glenford Bayley, MD  methocarbamol (ROBAXIN) 500 MG tablet Take 1 tablet (500 mg total) by mouth 2 (two) times daily. 08/15/22   Sherian Maroon A, PA  TEGRETOL-XR 200 MG 12 hr tablet Take 3 tablets by mouth 2 times daily. 01/25/22 04/20/23  Asa Lente, MD    Physical Exam: Vitals:   08/16/22 0349 08/16/22 1006 08/16/22 1015  BP: (!) 125/90 122/78   Pulse: 85 86   Resp: 16 19   Temp: 99.8 F (37.7 C)  99.6 F (37.6 C)  TempSrc: Oral  Oral  SpO2: 100% 97%   Weight: 93 kg    Height: 6\' 3"  (1.905 m)     Physical Exam Vitals and nursing note reviewed.  Constitutional:      General: He is awake. He is not in acute distress.    Appearance: He is well-developed.  HENT:     Head: Normocephalic.     Nose: No rhinorrhea.     Mouth/Throat:     Mouth: Mucous membranes are moist.  Eyes:     General: No scleral icterus.    Pupils: Pupils are equal, round, and reactive to light.  Neck:     Vascular: No JVD.  Cardiovascular:     Rate and Rhythm: Normal rate and regular rhythm.     Heart sounds: S1 normal and S2 normal.  Pulmonary:     Effort: No tachypnea, accessory muscle usage or respiratory distress.     Breath sounds: Normal breath sounds. No wheezing, rhonchi or rales.  Chest:     Chest wall: Tenderness present.     Comments: Right-sided chest wall tenderness. Abdominal:     General: Bowel sounds are normal.     Palpations: Abdomen is soft.     Tenderness: There is no abdominal tenderness. There is right CVA tenderness.  Musculoskeletal:     Cervical back: Neck supple.     Right lower leg: No edema.     Left lower leg: No edema.  Skin:    General: Skin is warm and dry.  Neurological:     Mental Status: He is alert and oriented to person, place, and time.  Psychiatric:         Mood and Affect: Mood normal.        Behavior: Behavior normal. Behavior is cooperative.   Data Reviewed:  Results are pending, will review when available.  Assessment and Plan: Principal Problem:   Acute pulmonary embolism (HCC) Admit to telemetry/inpatient. Supplemental oxygen as needed. Analgesics as needed. Antiemetics as needed. DOAC not started due to decreased bioavailability while on antiepileptics. Continue low molecular weight heparin per pharmacy recommendations. Check Doppler of both lower extremities  and echocardiogram.  Active Problems:   History of paroxysmal atrial flutter CHA2DS2-VASc Score of at least 1. The patient has been diagnosed with hypertension. However, he is normotensive now without therapy.    Convulsions/seizures (HCC) Continue Keppra 1500 mg p.o. twice daily. Continue carbamazepine 600 mg p.o. twice daily. Follow-up with neurology as an outpatient.    Leukocytosis In the setting of PE/pulmonary infarction. Follow-up WBC in AM.    Cerebral palsy [facility]) He is very functional. Supportive care as needed.    Abnormal x-ray of knee   Right knee pain MRI of the knee deferred to avoid chest wall discomfort. Consider obtaining once pleuritic pain has subsided. Continue analgesics as needed.      Advance Care Planning:   Code Status: Full Code   Consults:   Family Communication: His spouse was at bedside.  Severity of Illness: The appropriate patient status for this patient is INPATIENT. Inpatient status is judged to be reasonable and necessary in order to provide the required intensity of service to ensure the patient's safety. The patient's presenting symptoms, physical exam findings, and initial radiographic and laboratory data in the context of their chronic comorbidities is felt to place them at high risk for further clinical deterioration. Furthermore, it is not anticipated that the patient will be medically stable for discharge from the  hospital within 2 midnights of admission.   * I certify that at the point of admission it is my clinical judgment that the patient will require inpatient hospital care spanning beyond 2 midnights from the point of admission due to high intensity of service, high risk for further deterioration and high frequency of surveillance required.*  Author: Bobette Mo, MD 08/16/2022 11:37 AM  For on call review [email or web address].   This document was prepared using Dragon voice recognition software and may contain some unintended transcription errors.

## 2022-08-16 NOTE — ED Triage Notes (Signed)
Patient coming to ED for evaluation of SHOB, left sided pain under ribs and states the "pain now wraps around and goes to my left hip."  No chest pain.  Seen within last 24 hours and was given a muscle relaxer.  States pharmacy was unable to fill medication.

## 2022-08-16 NOTE — ED Notes (Signed)
Patient reports he is unable to move and is unable to get out of chair in triage and move to a wheelchair.  States pain is too bad.

## 2022-08-16 NOTE — ED Provider Notes (Signed)
[facility] Provider Note   CSN: [id number] Arrival date & time: 08/16/22  0340     History  Chief Complaint  Patient presents with   Shortness of Breath    Ricky Humphrey is a 31 y.o. male.  He has a history of seizure disorder and was seen for seizure last week had elevated CPK.  Was here yesterday for pain in his chest shortness of breath.  No clear etiology identified.  He said his symptoms have worsened.  Pain worse with breathing or moving throughout his chest and his back.  No cough no fever.  No significant prior breathing history.  Nausea no vomiting or diarrhea.  He is also complaining of pain in his legs, and tells me that somebody told him this could be fibromyalgia  The history is provided by the patient.  Shortness of Breath Severity:  Severe Onset quality:  Gradual Duration:  1 week Timing:  Constant Progression:  Worsening Chronicity:  New Relieved by:  Nothing Worsened by:  Activity, movement and deep breathing Ineffective treatments:  Rest Associated symptoms: abdominal pain and chest pain   Associated symptoms: no cough, no fever, no hemoptysis, no rash, no sore throat, no sputum production and no vomiting        Home Medications Prior to Admission medications   Medication Sig Start Date End Date Taking? Authorizing Provider  acetaminophen (TYLENOL) 325 MG tablet Take 650 mg by mouth every 6 (six) hours as needed for headache.    [provider]  albuterol (PROVENTIL) (5 MG/ML) 0.5% nebulizer solution Take 0.5 mLs (2.5 mg total) by nebulization every 6 (six) hours as needed for wheezing or shortness of breath. 08/15/22   Peter Garter, PA  ibuprofen (ADVIL) 600 MG tablet Take 1 tablet (600 mg total) by mouth every 6 (six) hours as needed. 08/15/22   Peter Garter, PA  levETIRAcetam (KEPPRA) 750 MG tablet Take 2 tablets by mouth 2 times daily. 01/22/22 04/17/23  Penumalli, Glenford Bayley, MD  methocarbamol  (ROBAXIN) 500 MG tablet Take 1 tablet (500 mg total) by mouth 2 (two) times daily. 08/15/22   Sherian Maroon A, PA  TEGRETOL-XR 200 MG 12 hr tablet Take 3 tablets by mouth 2 times daily. 01/25/22 04/20/23  Sater, Pearletha Furl, MD      Allergies    Patient has no known allergies.    Review of Systems   Review of Systems  Constitutional:  Negative for fever.  HENT:  Negative for sore throat.   Eyes:  Negative for visual disturbance.  Respiratory:  Positive for shortness of breath. Negative for cough, hemoptysis and sputum production.   Cardiovascular:  Positive for chest pain.  Gastrointestinal:  Positive for abdominal pain. Negative for vomiting.  Genitourinary:  Negative for dysuria.  Skin:  Negative for rash.    Physical Exam Updated Vital Signs BP (!) 125/90 (BP Location: Right Arm)   Pulse 85   Temp 99.8 F (37.7 C) (Oral)   Resp 16   Ht 6\' 3"  (1.905 m)   Wt 93 kg   SpO2 100%   BMI 25.62 kg/m  Physical Exam Vitals and nursing note reviewed.  Constitutional:      General: He is in acute distress.     Appearance: He is well-developed.  HENT:     Head: Normocephalic and atraumatic.  Eyes:     Conjunctiva/sclera: Conjunctivae normal.  Cardiovascular:     Rate and Rhythm: Normal rate and regular rhythm.  Heart sounds: No murmur heard. Pulmonary:     Effort: Tachypnea and accessory muscle usage present. No respiratory distress.     Breath sounds: Normal breath sounds.  Abdominal:     Palpations: Abdomen is soft.     Tenderness: There is no abdominal tenderness.  Musculoskeletal:        General: No swelling.     Cervical back: Neck supple.     Right lower leg: No edema.     Left lower leg: No edema.  Skin:    General: Skin is warm and dry.     Capillary Refill: Capillary refill takes less than 2 seconds.  Neurological:     General: No focal deficit present.     Mental Status: He is alert.     ED Results / Procedures / Treatments   Labs (all labs ordered are  listed, but only abnormal results are displayed) Labs Reviewed  COMPREHENSIVE METABOLIC PANEL - Abnormal; Notable for the following components:      Result Value   Glucose, Bld 104 (*)    Total Protein 8.3 (*)    All other components within normal limits  CBC WITH DIFFERENTIAL/PLATELET - Abnormal; Notable for the following components:   WBC 16.6 (*)    Neutro Abs 13.1 (*)    Monocytes Absolute 2.1 (*)    All other components within normal limits  LIPASE, BLOOD  CK  CBC  COMPREHENSIVE METABOLIC PANEL  TROPONIN I (HIGH SENSITIVITY)    EKG EKG Interpretation  Date/Time:  Saturday August 16 2022 10:00:15 EST Ventricular Rate:  94 PR Interval:  145 QRS Duration: 102 QT Interval:  320 QTC Calculation: 401 R Axis:   71 Text Interpretation: Sinus rhythm Probable left ventricular hypertrophy ST elev, probable normal early repol pattern No significant change since last tracing Confirmed by Meridee ScoreButler, Sonyia Muro [phone]([id number]) on 08/16/2022 10:04:34 AM  Radiology CT Angio Chest/Abd/Pel for Dissection W and/or W/WO  Result Date: 08/16/2022 CLINICAL DATA:  Shortness of breath. Rule out acute aortic syndrome. EXAM: CT ANGIOGRAPHY CHEST, ABDOMEN AND PELVIS TECHNIQUE: Non-contrast CT of the chest was initially obtained. Multidetector CT imaging through the chest, abdomen and pelvis was performed using the standard protocol during bolus administration of intravenous contrast. Multiplanar reconstructed images and MIPs were obtained and reviewed to evaluate the vascular anatomy. RADIATION DOSE REDUCTION: This exam was performed according to the departmental dose-optimization program which includes automated exposure control, adjustment of the mA and/or kV according to patient size and/or use of iterative reconstruction technique. CONTRAST:  100mL OMNIPAQUE IOHEXOL 350 MG/ML SOLN COMPARISON:  None Available. FINDINGS: CTA CHEST FINDINGS Cardiovascular: Preferential opacification of the thoracic aorta. Pulse a  shin artifact diminishes exam detail at the level of the ascending thoracic aorta and proximal arch. No evidence of thoracic aortic aneurysm or dissection. Normal heart size. No pericardial effusion. Although there is suboptimal pulmonary arterial opacification there is signs of acute pulmonary embolus within the posterior right lower lobar and segmental pulmonary arteries, image 66/6. Mediastinum/Nodes: No enlarged mediastinal, hilar, or axillary lymph nodes. Thyroid gland, trachea, and esophagus demonstrate no significant findings. Lungs/Pleura: Exam detail is diminished due to motion artifact. Ground-glass and airspace disease noted within the posterior right base, image 103/5. Likely pulmonary infarct. Subsegmental atelectasis noted in the left base. Subpleural nodule in the right middle lobe measures 3 mm, image 102/5. Musculoskeletal: No chest wall abnormality. No acute or significant osseous findings. Review of the MIP images confirms the above findings. CTA ABDOMEN AND PELVIS FINDINGS VASCULAR  Aorta: Normal caliber aorta without aneurysm, dissection, vasculitis or significant stenosis. Celiac: Patent without evidence of aneurysm, dissection, vasculitis or significant stenosis. SMA: Patent without evidence of aneurysm, dissection, vasculitis or significant stenosis. Renals: Both renal arteries are patent without evidence of aneurysm, dissection, vasculitis, fibromuscular dysplasia or significant stenosis. IMA: Patent without evidence of aneurysm, dissection, vasculitis or significant stenosis. Inflow: Patent without evidence of aneurysm, dissection, vasculitis or significant stenosis. Veins: No obvious venous abnormality within the limitations of this arterial phase study. Review of the MIP images confirms the above findings. NON-VASCULAR Hepatobiliary: No focal liver abnormality is seen. No gallstones, gallbladder wall thickening, or biliary dilatation. Pancreas: Unremarkable. No pancreatic ductal dilatation  or surrounding inflammatory changes. Spleen: Normal in size without focal abnormality. Adrenals/Urinary Tract: Normal appearance of the adrenal glands. No kidney mass or hydronephrosis. Urinary bladder is unremarkable. Stomach/Bowel: Stomach appears normal. Stomach appears normal. Appendix not visualized. No bowel wall thickening, inflammation, or distension. Lymphatic: No adenopathy. Reproductive: Prostate is unremarkable. Other: No free fluid or fluid collections identified Musculoskeletal: No acute or significant osseous findings. Review of the MIP images confirms the above findings. IMPRESSION: 1. Examination is positive for acute pulmonary embolus within the posterior right lower lobar and segmental pulmonary arteries. 2. Ground-glass and airspace disease within the posterior right base is favored to represent pulmonary infarct. 3. No evidence for aortic dissection or aneurysm. 4. No acute findings within the abdomen or pelvis. 5. 3 mm right middle lobe lung nodule. There are no specific follow-up recommendations for this age group. Electronically Signed   By: Signa Kell M.D.   On: 08/16/2022 11:03   DG Chest 2 View  Result Date: 08/15/2022 CLINICAL DATA:  SOB EXAM: CHEST - 2 VIEW COMPARISON:  Fall 132023 FINDINGS: The heart size and mediastinal contours are within normal limits. Both lungs are clear. The visualized skeletal structures are unremarkable. IMPRESSION: No active cardiopulmonary disease. Electronically Signed   By: Layla Maw M.D.   On: 08/15/2022 09:57    Procedures .Critical Care  Performed by: Terrilee Files, MD Authorized by: Terrilee Files, MD   Critical care provider statement:    Critical care time (minutes):  45   Critical care time was exclusive of:  Separately billable procedures and treating other patients   Critical care was necessary to treat or prevent imminent or life-threatening deterioration of the following conditions:  Respiratory failure   Critical  care was time spent personally by me on the following activities:  Development of treatment plan with patient or surrogate, discussions with consultants, evaluation of patient's response to treatment, examination of patient, obtaining history from patient or surrogate, ordering and performing treatments and interventions, ordering and review of laboratory studies, ordering and review of radiographic studies, pulse oximetry, re-evaluation of patient's condition and review of old charts   I assumed direction of critical care for this patient from another provider in my specialty: no       Medications Ordered in ED Medications  acetaminophen (TYLENOL) tablet 650 mg (has no administration in time range)    Or  acetaminophen (TYLENOL) suppository 650 mg (has no administration in time range)  prochlorperazine (COMPAZINE) injection 5 mg (has no administration in time range)  oxyCODONE (Oxy IR/ROXICODONE) immediate release tablet 5 mg (has no administration in time range)  morphine (PF) 4 MG/ML injection 4 mg (4 mg Intravenous Given 08/16/22 1603)  levETIRAcetam (KEPPRA) tablet 1,500 mg (1,500 mg Oral Given 08/16/22 1216)  carbamazepine (TEGRETOL XR) 12 hr tablet 600  mg (600 mg Oral Given 08/16/22 1215)  enoxaparin (LOVENOX) injection 92.5 mg (has no administration in time range)  albuterol (PROVENTIL) (2.5 MG/3ML) 0.083% nebulizer solution 2.5 mg (has no administration in time range)  pneumococcal 20-valent conjugate vaccine (PREVNAR 20) injection 0.5 mL (has no administration in time range)  morphine (PF) 4 MG/ML injection 4 mg (4 mg Intravenous Given 08/16/22 0949)  sodium chloride 0.9 % bolus 1,000 mL (0 mLs Intravenous Stopped 08/16/22 1117)  ondansetron (ZOFRAN) injection 4 mg (4 mg Intravenous Given 08/16/22 0949)  iohexol (OMNIPAQUE) 350 MG/ML injection 100 mL (100 mLs Intravenous Contrast Given 08/16/22 1037)  oxyCODONE-acetaminophen (PERCOCET/ROXICET) 5-325 MG per tablet 1 tablet (1 tablet  Oral Given 08/16/22 1124)  morphine (PF) 4 MG/ML injection 4 mg (4 mg Intravenous Given 08/16/22 1124)  enoxaparin (LOVENOX) injection 92.5 mg (92.5 mg Subcutaneous Given 08/16/22 1130)    ED Course/ Medical Decision Making/ A&P Clinical Course as of 08/16/22 1134  Sat Aug 16, 2022  1134 I consulted pharmacy for recommendations on anticoagulation.  They said because of his carbamazepine the right DOAC is contraindicated.  They are recommending Coumadin with Lovenox bridge.  I have ordered first dose of Lovenox.  Because of his significant pain feel he would benefit from admission to the hospital for further management.  Discussed with [facility] hospitalist Dr. Robb Matar. [MB]    Clinical Course User Index [MB] Terrilee Files, MD                           Medical Decision Making Amount and/or Complexity of Data Reviewed Labs: ordered. Radiology: ordered.  Risk Prescription drug management. Decision regarding hospitalization.   This patient complains of right-sided chest pain shortness of breath; this involves an extensive number of treatment Options and is a complaint that carries with it a high risk of complications and morbidity. The differential includes pneumonia, pneumothorax, PE, musculoskeletal, ACS  I ordered, reviewed and interpreted labs, which included CBC with elevated white count stable hemoglobin, chemistries LFTs normal, troponins flat, CK normal I ordered medication IV pain medication subcu anticoagulation and reviewed PMP when indicated. I ordered imaging studies which included CT angio chest and I independently    visualized and interpreted imaging which showed acute right lobar PE with possible pulmonary infarction Previous records obtained and reviewed in epic including ED visit yesterday I consulted Dr. Robb Matar [facility] and discussed lab and imaging findings and discussed disposition.  Cardiac monitoring reviewed, normal sinus rhythm Social determinants  considered, patient with food insecurity Critical Interventions: Initiation of anticoagulation for acute PE  After the interventions stated above, I reevaluated the patient and found patient's pain still to be significant although satting well on room air no respiratory distress Admission and further testing considered, he would benefit from mission for further anticoagulation and transition into oral anticoagulation.  Patient in agreement with plan for admission         Final Clinical Impression(s) / ED Diagnoses Final diagnoses:  Acute pulmonary embolism without acute cor pulmonale, unspecified pulmonary embolism type [facility])    Rx / DC Orders ED Discharge Orders     None         Terrilee Files, MD 08/16/22 1826

## 2022-08-16 NOTE — Progress Notes (Signed)
ANTICOAGULATION CONSULT NOTE - Initial Consult  Pharmacy Consult for Lovenox Indication: pulmonary embolus  No Known Allergies  Patient Measurements: Height: 6\' 3"  (190.5 cm) Weight: 93 kg (205 lb) IBW/kg (Calculated) : 84.5  Vital Signs: Temp: 99.6 F (37.6 C) (12/23 1015) Temp Source: Oral (12/23 1015) BP: 122/78 (12/23 1006) Pulse Rate: 86 (12/23 1006)  Labs: Recent Labs    08/15/22 0925 08/16/22 0941  HGB 15.0 14.2  HCT 43.2 42.0  PLT 236 240  CREATININE 1.03 1.09  CKTOTAL 183 140  TROPONINIHS 4 5    Estimated Creatinine Clearance: 118.4 mL/min (by C-G formula based on SCr of 1.09 mg/dL).   Medical History: Past Medical History:  Diagnosis Date   Arachnoid cyst    s/p drainage by craniotomy   Cerebral palsy (HCC)    mild   Epilepsy (HCC)    Headache    Paroxysmal atrial flutter (HCC)    brief, post sz on 09/22/11   Seizure (HCC)    Seizures (HCC)    last June 2020    Medications:  Scheduled:   carbamazepine  600 mg Oral BID   levETIRAcetam  1,500 mg Oral BID   Infusions:  PRN: acetaminophen **OR** acetaminophen, albuterol, morphine injection, oxyCODONE, prochlorperazine  Assessment: 31 yo male with history of seizures presents with shortness of breath, CTa +PE.  Pharmacy consulted to dose Lovenox.  Goal of Therapy:  Anti-Xa level 0.6-1 units/ml 4hrs after LMWH dose given Monitor platelets by anticoagulation protocol: Yes   Plan:  Lovenox 1mg /kg SQ q12h Follow CBC at least q72h  26, PharmD, BCPS Pharmacy: [phone] 08/16/2022,11:55 AM

## 2022-08-17 ENCOUNTER — Inpatient Hospital Stay (HOSPITAL_BASED_OUTPATIENT_CLINIC_OR_DEPARTMENT_OTHER): Payer: 59

## 2022-08-17 ENCOUNTER — Inpatient Hospital Stay (HOSPITAL_COMMUNITY): Payer: 59

## 2022-08-17 DIAGNOSIS — Z8679 Personal history of other diseases of the circulatory system: Secondary | ICD-10-CM

## 2022-08-17 DIAGNOSIS — M25561 Pain in right knee: Secondary | ICD-10-CM

## 2022-08-17 DIAGNOSIS — I2699 Other pulmonary embolism without acute cor pulmonale: Secondary | ICD-10-CM | POA: Diagnosis not present

## 2022-08-17 DIAGNOSIS — G809 Cerebral palsy, unspecified: Secondary | ICD-10-CM | POA: Diagnosis not present

## 2022-08-17 DIAGNOSIS — R0602 Shortness of breath: Secondary | ICD-10-CM

## 2022-08-17 DIAGNOSIS — R569 Unspecified convulsions: Secondary | ICD-10-CM

## 2022-08-17 DIAGNOSIS — R936 Abnormal findings on diagnostic imaging of limbs: Secondary | ICD-10-CM

## 2022-08-17 LAB — CBC
HCT: 38.1 % — ABNORMAL LOW (ref 39.0–52.0)
Hemoglobin: 12.8 g/dL — ABNORMAL LOW (ref 13.0–17.0)
MCH: 32.3 pg (ref 26.0–34.0)
MCHC: 33.6 g/dL (ref 30.0–36.0)
MCV: 96.2 fL (ref 80.0–100.0)
Platelets: 243 10*3/uL (ref 150–400)
RBC: 3.96 MIL/uL — ABNORMAL LOW (ref 4.22–5.81)
RDW: 12.9 % (ref 11.5–15.5)
WBC: 10.3 10*3/uL (ref 4.0–10.5)
nRBC: 0 % (ref 0.0–0.2)

## 2022-08-17 LAB — ECHOCARDIOGRAM COMPLETE
AR max vel: 3.38 cm2
AV Area VTI: 3.69 cm2
AV Area mean vel: 3.16 cm2
AV Mean grad: 3 mmHg
AV Peak grad: 6.6 mmHg
Ao pk vel: 1.28 m/s
Area-P 1/2: 3.72 cm2
Calc EF: 63 %
Height: 75 in
MV M vel: 2.57 m/s
MV Peak grad: 26.4 mmHg
S' Lateral: 3.3 cm
Single Plane A2C EF: 63.6 %
Single Plane A4C EF: 63.2 %
Weight: 3280 oz

## 2022-08-17 LAB — COMPREHENSIVE METABOLIC PANEL
ALT: 20 U/L (ref 0–44)
AST: 17 U/L (ref 15–41)
Albumin: 3.1 g/dL — ABNORMAL LOW (ref 3.5–5.0)
Alkaline Phosphatase: 55 U/L (ref 38–126)
Anion gap: 6 (ref 5–15)
BUN: 12 mg/dL (ref 6–20)
CO2: 27 mmol/L (ref 22–32)
Calcium: 9 mg/dL (ref 8.9–10.3)
Chloride: 104 mmol/L (ref 98–111)
Creatinine, Ser: 1.04 mg/dL (ref 0.61–1.24)
GFR, Estimated: >60 mL/min (ref >60)
Glucose, Bld: 91 mg/dL (ref 70–99)
Potassium: 4.6 mmol/L (ref 3.5–5.1)
Sodium: 137 mmol/L (ref 135–145)
Total Bilirubin: 0.6 mg/dL (ref 0.3–1.2)
Total Protein: 7.2 g/dL (ref 6.5–8.1)

## 2022-08-17 MED ORDER — OXYCODONE HCL 5 MG PO TABS
10.0000 mg | ORAL_TABLET | ORAL | Status: DC | PRN
  Administered 2022-08-17 – 2022-08-19 (×8): 10 mg via ORAL
  Filled 2022-08-17 (×8): qty 2

## 2022-08-17 NOTE — Progress Notes (Signed)
  Echocardiogram 2D Echocardiogram has been performed.  Ricky Humphrey 08/17/2022, 8:20 AM

## 2022-08-17 NOTE — Progress Notes (Signed)
[facility]  PROGRESS NOTE  Ricky Humphrey SFK:[id number] DOB: March 20, 1991 DOA: 08/16/2022 PCP: Pcp, No   Brief HPI:   31 year old male with medical history of recurrent cyst drainage by craniotomy, mild cerebral palsy, epilepsy, headache, paroxysmal atrial flutter, chronic posttraumatic headaches, cannabis abuse, traumatic rhabdomyolysis came to ED with right lower chest wall pleuritic pain and dyspnea since 08/14/2022.  Also has been having bilateral lower extremity spasms. In the ED CTA chest abdomen/pelvis showed PE involving posterior right lower lobar and segmental pulmonary arteries.  Groundglass and airspace disease within the posterior right base likely from pulmonary infarct.  No evidence of aortic dissection or aneurysm. Patient started on Lovenox.    Subjective   Patient seen and examined, continues to have right-sided pleuritic chest pain.  Dose of oxycodone changed to 10 mg every 4 hours as needed   Assessment/Plan:     Acute pulmonary embolism -Venous duplex of lower extremities showed right lower extremity DVT -Echocardiogram showed EF of 55 to 60%.  Right ventricular systolic function and ventricle size is normal.  Normal pulmonary artery systolic pressure. -Continue antiemetics as needed -Continue analgesics as needed -DOAC not  started due to decreased ability while on antiepileptics -continue low molecular weight heparin per pharmacy recommendations.    History of paroxysmal atrial flutter CHA2DS2-VASc Score of at least 1. The patient has been diagnosed with hypertension. However, he is normotensive now without therapy.   Convulsions/seizures  Continue Keppra 1500 mg p.o. twice daily. Continue carbamazepine 600 mg p.o. twice daily. Follow-up with neurology as an outpatient.   Leukocytosis In the setting of PE/pulmonary infarction. Wbc is down to 10.3   Cerebral palsy He is very functional. Supportive care as needed.   Abnormal x-ray of  knee -Right knee pain - Right knee x-rays done on 06/30/2022 did not show any acute findings.  However there were 2 ossified loose bodies in the posterolateral joint space without obvious bone or site.  There was a small joint effusion.  MRI of the knee was recommended. -Patient states that he had MRI as outpatient at [facility]; however no MRI report available in epic -Patient will try to get records       Medications     carbamazepine  600 mg Oral BID   enoxaparin (LOVENOX) injection  1 mg/kg Subcutaneous Q12H   levETIRAcetam  1,500 mg Oral BID   pneumococcal 20-valent conjugate vaccine  0.5 mL Intramuscular Tomorrow-1000     Data Reviewed:   CBG:  No results for input(s): "GLUCAP" in the last 168 hours.  SpO2: 100 %    Vitals:   08/16/22 1300 08/16/22 2012 08/17/22 0300 08/17/22 1259  BP: 136/76 132/82 135/86 119/73  Pulse: 88 79 81 81  Resp: 18 18 18 18   Temp: 99.4 F (37.4 C) 98.6 F (37 C) 98.8 F (37.1 C) 98.1 F (36.7 C)  TempSrc: Oral Oral Oral Oral  SpO2: 100% 100% 100% 100%  Weight:      Height:          Data Reviewed:  Basic Metabolic Panel: Recent Labs  Lab 08/15/22 0925 08/16/22 0941 08/17/22 0513  NA 136 136 137  K 4.1 4.2 4.6  CL 103 105 104  CO2 24 24 27   GLUCOSE 104* 104* 91  BUN 12 14 12   CREATININE 1.03 1.09 1.04  CALCIUM 8.8* 8.9 9.0    CBC: Recent Labs  Lab 08/15/22 0925 08/16/22 0941 08/17/22 0513  WBC 13.6* 16.6* 10.3  NEUTROABS 9.9* 13.1*  --  HGB 15.0 14.2 12.8*  HCT 43.2 42.0 38.1*  MCV 94.3 94.8 96.2  PLT 236 240 243    LFT Recent Labs  Lab 08/15/22 0925 08/16/22 0941 08/17/22 0513  AST 22 19 17   ALT 26 24 20   ALKPHOS 57 67 55  BILITOT 0.5 0.8 0.6  PROT 7.4 8.3* 7.2  ALBUMIN 3.5 3.8 3.1*     Antibiotics: Anti-infectives (From admission, onward)    None        DVT prophylaxis: Lovenox  Code Status: Full code  Family Communication: Discussed with patient's wife at  bedside   CONSULTS    Objective    Physical Examination:   General: Appears in no acute distress Cardiovascular: S1-S2, regular, no murmur auscultated Respiratory: Clear to auscultation bilaterally Abdomen: Soft, nontender, no organomegaly Extremities: No edema in the lower extremities Neurologic: Alert, oriented x 3, no focal deficit noted   Status is: Inpatient: Acute pulmonary embolism           Nikesha Kwasny S Haeley [place]   [facility] If 7PM-7AM, please contact night-coverage at [email or web address], Office  [phone]   08/17/2022, 3:29 PM  LOS: 1 day

## 2022-08-17 NOTE — Progress Notes (Signed)
BLE venous duplex has been completed.  Preliminary findings given to Dr. Sharl Ma.   Results can be found under chart review under CV PROC. 08/17/2022 2:17 PM Eulalah Rupert RVT, RDMS

## 2022-08-18 DIAGNOSIS — I2699 Other pulmonary embolism without acute cor pulmonale: Secondary | ICD-10-CM | POA: Diagnosis present

## 2022-08-18 DIAGNOSIS — R936 Abnormal findings on diagnostic imaging of limbs: Secondary | ICD-10-CM | POA: Diagnosis not present

## 2022-08-18 NOTE — Progress Notes (Signed)
[facility]  PROGRESS NOTE  Ricky Humphrey ASN:[id number] DOB: 09-Sep-1990 DOA: 08/16/2022 PCP: Pcp, No   Brief HPI:   31 year old male with medical history of recurrent cyst drainage by craniotomy, mild cerebral palsy, epilepsy, headache, paroxysmal atrial flutter, chronic posttraumatic headaches, cannabis abuse, traumatic rhabdomyolysis came to ED with right lower chest wall pleuritic pain and dyspnea since 08/14/2022.  Also has been having bilateral lower extremity spasms. In the ED CTA chest abdomen/pelvis showed PE involving posterior right lower lobar and segmental pulmonary arteries.  Groundglass and airspace disease within the posterior right base likely from pulmonary infarct.  No evidence of aortic dissection or aneurysm. Patient started on Lovenox.    Subjective   Patient seen and examined, denies pain or shortness of breath.   Assessment/Plan:     Acute pulmonary embolism -Venous duplex of lower extremities showed right lower extremity DVT -Echocardiogram showed EF of 55 to 60%.  Right ventricular systolic function and ventricle size is normal.  Normal pulmonary artery systolic pressure. -Continue antiemetics as needed -Continue analgesics as needed -DOAC not  started due to decreased ability while on antiepileptics -continue low molecular weight heparin per pharmacy recommendations.  -No pharmacy open today, likely discharge home in a.m.   History of paroxysmal atrial flutter CHA2DS2-VASc Score of at least 1. -He was not started on anticoagulation in the past  Hypertension The patient has been diagnosed with hypertension. However, he is normotensive now without therapy.   Convulsions/seizures  Continue Keppra 1500 mg p.o. twice daily. Continue carbamazepine 600 mg p.o. twice daily. Follow-up with neurology as an outpatient.   Leukocytosis In the setting of PE/pulmonary infarction. Wbc is down to 10.3   Cerebral palsy He is very  functional. Supportive care as needed.   Abnormal x-ray of knee -Right knee pain - Right knee x-rays done on 06/30/2022 did not show any acute findings.  However there were 2 ossified loose bodies in the posterolateral joint space without obvious bone or site.  There was a small joint effusion.  MRI of the knee was recommended. -Patient states that he had MRI as outpatient at [facility]; however no MRI report available in epic -Patient has seen orthopedic surgeon as outpatient -Follow-up orthopedics as outpatient   Pulmonary nodule -3 mm pulmonary nodule seen on CT chest -No follow-up recommended however patient mother states that patient's  cousin has a history of lung cancer -Follow-up PCP for monitoring of pulm nodule as outpatient    Medications     carbamazepine  600 mg Oral BID   enoxaparin (LOVENOX) injection  1 mg/kg Subcutaneous Q12H   levETIRAcetam  1,500 mg Oral BID   pneumococcal 20-valent conjugate vaccine  0.5 mL Intramuscular Tomorrow-1000     Data Reviewed:   CBG:  No results for input(s): "GLUCAP" in the last 168 hours.  SpO2: 100 %    Vitals:   08/17/22 0300 08/17/22 1259 08/17/22 2105 08/18/22 0445  BP: 135/86 119/73 128/81 118/79  Pulse: 81 81 70 64  Resp: 18 18 17    Temp: 98.8 F (37.1 C) 98.1 F (36.7 C) 98.9 F (37.2 C) 98.9 F (37.2 C)  TempSrc: Oral Oral Oral   SpO2: 100% 100% 100% 100%  Weight:      Height:          Data Reviewed:  Basic Metabolic Panel: Recent Labs  Lab 08/15/22 0925 08/16/22 0941 08/17/22 0513  NA 136 136 137  K 4.1 4.2 4.6  CL 103 105 104  CO2  24 24 27   GLUCOSE 104* 104* 91  BUN 12 14 12   CREATININE 1.03 1.09 1.04  CALCIUM 8.8* 8.9 9.0    CBC: Recent Labs  Lab 08/15/22 0925 08/16/22 0941 08/17/22 0513  WBC 13.6* 16.6* 10.3  NEUTROABS 9.9* 13.1*  --   HGB 15.0 14.2 12.8*  HCT 43.2 42.0 38.1*  MCV 94.3 94.8 96.2  PLT 236 240 243    LFT Recent Labs  Lab 08/15/22 0925 08/16/22 0941  08/17/22 0513  AST 22 19 17   ALT 26 24 20   ALKPHOS 57 67 55  BILITOT 0.5 0.8 0.6  PROT 7.4 8.3* 7.2  ALBUMIN 3.5 3.8 3.1*     Antibiotics: Anti-infectives (From admission, onward)    None        DVT prophylaxis: Lovenox  Code Status: Full code  Family Communication: Discussed with patient's wife at bedside   CONSULTS    Objective    Physical Examination:   Appears in no acute distress S1-S2, regular Clear to auscultation bilaterally Abdomen is soft, nontender, no organomegaly   Status is: Inpatient: Acute pulmonary embolism           Yuko Coventry S Tavi Hoogendoorn   [facility] If 7PM-7AM, please contact night-coverage at [email or web address], Office  [phone]   08/18/2022, 2:53 PM  LOS: 2 days

## 2022-08-19 ENCOUNTER — Other Ambulatory Visit (HOSPITAL_COMMUNITY): Payer: Self-pay

## 2022-08-19 DIAGNOSIS — I2699 Other pulmonary embolism without acute cor pulmonale: Secondary | ICD-10-CM | POA: Diagnosis not present

## 2022-08-19 DIAGNOSIS — R936 Abnormal findings on diagnostic imaging of limbs: Secondary | ICD-10-CM | POA: Diagnosis not present

## 2022-08-19 DIAGNOSIS — G809 Cerebral palsy, unspecified: Secondary | ICD-10-CM | POA: Diagnosis not present

## 2022-08-19 DIAGNOSIS — R569 Unspecified convulsions: Secondary | ICD-10-CM | POA: Diagnosis not present

## 2022-08-19 DIAGNOSIS — I82431 Acute embolism and thrombosis of right popliteal vein: Secondary | ICD-10-CM

## 2022-08-19 MED ORDER — INFLUENZA VAC SPLIT QUAD 0.5 ML IM SUSY
0.5000 mL | PREFILLED_SYRINGE | INTRAMUSCULAR | Status: AC
  Administered 2022-08-19: 0.5 mL via INTRAMUSCULAR
  Filled 2022-08-19: qty 0.5

## 2022-08-19 MED ORDER — ENOXAPARIN (LOVENOX) PATIENT EDUCATION KIT
PACK | Freq: Once | Status: DC
  Filled 2022-08-19: qty 1

## 2022-08-19 MED ORDER — ENOXAPARIN (LOVENOX) PATIENT EDUCATION KIT: 1.0000 | PACK | Freq: Once | Status: AC

## 2022-08-19 MED ORDER — ENOXAPARIN SODIUM 150 MG/ML IJ SOSY
140.0000 mg | PREFILLED_SYRINGE | Freq: Every day | INTRAMUSCULAR | 2 refills | Status: DC
  Filled 2022-08-19: qty 30, 30d supply, fill #0
  Filled 2022-09-16: qty 30, 30d supply, fill #1

## 2022-08-19 MED ORDER — OXYCODONE HCL 10 MG PO TABS
10.0000 mg | ORAL_TABLET | Freq: Four times a day (QID) | ORAL | 0 refills | Status: DC | PRN
  Filled 2022-08-19: qty 20, 5d supply, fill #0

## 2022-08-19 NOTE — Discharge Summary (Addendum)
Physician Discharge Summary   Patient: Ricky Humphrey MRN: [id number] DOB: 1991-03-07  Admit date:     08/16/2022  Discharge date: 08/19/22  Discharge Physician: Ricky Humphrey   PCP: Ricky Humphrey   Recommendations at discharge:   Follow-up PCP as outpatient Continue taking Lovenox 140 mg subcu nightly at least for 3 months Follow-up PCP to discuss monitoring for pulmonary nodule as outpatient  Discharge Diagnoses: Principal Problem:   Acute pulmonary embolism (Ricky Humphrey) Active Problems:   History of atrial flutter   Convulsions/seizures ([facility])   Leukocytosis   Cerebral palsy ([facility])   Abnormal x-ray of knee   Right knee pain   Pulmonary embolism ([name])  Resolved Problems:   * Humphrey resolved hospital problems. *  Hospital Course: 31 year old male with medical history of recurrent cyst drainage by craniotomy, mild cerebral palsy, epilepsy, headache, paroxysmal atrial flutter, chronic posttraumatic headaches, cannabis abuse, traumatic rhabdomyolysis came to ED with right lower chest wall pleuritic pain and dyspnea since 08/14/2022.  Also has been having bilateral lower extremity spasms. In the ED CTA chest abdomen/pelvis showed PE involving posterior right lower lobar and segmental pulmonary arteries.  Groundglass and airspace disease within the posterior right base likely from pulmonary infarct.  Humphrey evidence of aortic dissection or aneurysm. Patient started on Lovenox.    Assessment and Plan:  Acute pulmonary embolism -Venous duplex of lower extremities showed right lower extremity DVT -Echocardiogram showed EF of 55 to 60%.  Right ventricular systolic function and ventricle size is normal.  Normal pulmonary artery systolic pressure. -Continue antiemetics as needed -Continue analgesics as needed -DOAC not  started due to decreased ability while on antiepileptics -continue low molecular weight heparin per pharmacy recommendations.  -Will discharge patient home on Lovenox 140 mg subcu  daily starting from tonight at least for 3 months.  Called and explained to patient's wife that he has been given 2 more refills of this medication and has 60-monthsupply of Lovenox.  He should follow-up with PCP to discuss further duration for treatment.  Right lower extremity DVT -Venous Doppler of lower extremity showed DVT in popliteal vein -Started on Lovenox as above    History of paroxysmal atrial flutter CHA2DS2-VASc Score of at least 1. -He was not started on anticoagulation in the past   Hypertension The patient has been diagnosed with hypertension. However, he is normotensive now without therapy.   Convulsions/seizures  Continue Keppra 1500 mg p.o. twice daily. Continue carbamazepine 600 mg p.o. twice daily. Follow-up with neurology as an outpatient.   Leukocytosis In the setting of PE/pulmonary infarction. Wbc is down to 10.3   Cerebral palsy He is very functional. Supportive care as needed.   Abnormal x-ray of knee -Right knee pain - Right knee x-rays done on 06/30/2022 did not show any acute findings.  However there were 2 ossified loose bodies in the posterolateral joint space without obvious bone or site.  There was a small joint effusion.  MRI of the knee was recommended. -Patient [facility] however [facility] MRI report available in epic -Patient has seen orthopedic surgeon as outpatient -Follow-up orthopedics as outpatient   Pulmonary nodule -3 mm pulmonary nodule seen on CT chest -Humphrey follow-up recommended however patient mother states that patient's  cousin has a history of lung cancer -Follow-up PCP for monitoring of pulm nodule as outpatient        Consultants:  Procedures performed:  Disposition: Home Diet recommendation:  Discharge Diet Orders (From  admission, onward)     Start     Ordered   08/19/22 0000  Diet - low sodium heart healthy        08/19/22 1039           Regular diet DISCHARGE  MEDICATION: Allergies as of 08/19/2022   Humphrey Known Allergies      Medication List     STOP taking these medications    ibuprofen 600 MG tablet Commonly known as: ADVIL       TAKE these medications    albuterol (5 MG/ML) 0.5% nebulizer solution Commonly known as: PROVENTIL Take 0.5 mLs (2.5 mg total) by nebulization every 6 (six) hours as needed for wheezing or shortness of breath.   enoxaparin 150 MG/ML injection Commonly known as: LOVENOX Inject 0.94 mLs (140 mg total) into the skin at bedtime.   enoxaparin Kit Commonly known as: LOVENOX 1 kit by Does not apply route once for 1 dose.   levETIRAcetam 750 MG tablet Commonly known as: KEPPRA Take 2 tablets by mouth 2 times daily.   methocarbamol 500 MG tablet Commonly known as: ROBAXIN Take 1 tablet (500 mg total) by mouth 2 (two) times daily.   MULTIVITAL PO Take 1 tablet by mouth daily.   Oxycodone HCl 10 MG Tabs Take 1 tablet (10 mg total) by mouth every 6 (six) hours as needed for moderate pain.   TEGretol-XR 200 MG 12 hr tablet Generic drug: carbamazepine Take 3 tablets by mouth 2 times daily.        Discharge Exam: Filed Weights   08/16/22 0349  Weight: 93 kg   General-appears in Humphrey acute distress Heart-S1-S2, regular, Humphrey murmur auscultated Lungs-clear to auscultation bilaterally, Humphrey wheezing or crackles auscultated Abdomen-soft, nontender, Humphrey organomegaly Extremities-Humphrey edema in the lower extremities Neuro-alert, oriented x3, Humphrey focal deficit noted  Condition at discharge: good  The results of significant diagnostics from this hospitalization (including imaging, microbiology, ancillary and laboratory) are listed below for reference.   Imaging Studies: VAS [place] LOWER EXTREMITY VENOUS (DVT)  Result Date: 08/17/2022  Lower Venous DVT Study Patient Name:  Ricky Humphrey  Date of Exam:   08/17/2022 Medical Rec #: [id number]               Accession #:    [id number] Date of Birth: 08/27/1990                Patient Gender: M Patient Age:   41 years Exam Location:  [facility] Procedure:      VAS [place] LOWER EXTREMITY VENOUS (DVT) Referring Phys: Ricky Humphrey --------------------------------------------------------------------------------  Indications: Pulmonary embolism. Other Indications: Recent knee injury and decreased activity. Comparison Study: Humphrey previous exams Performing Technologist: Jody Hill RVT, RDMS  Examination Guidelines: A complete evaluation includes B-mode imaging, spectral Doppler, color Doppler, and power Doppler as needed of all accessible portions of each vessel. Bilateral testing is considered an integral part of a complete examination. Limited examinations for reoccurring indications may be performed as noted. The reflux portion of the exam is performed with the patient in reverse Trendelenburg.  +---------+---------------+---------+-----------+----------+--------------+ RIGHT    CompressibilityPhasicitySpontaneityPropertiesThrombus Aging +---------+---------------+---------+-----------+----------+--------------+ CFV      Full           Yes      Yes                                 +---------+---------------+---------+-----------+----------+--------------+ SFJ  Full                                                        +---------+---------------+---------+-----------+----------+--------------+ FV Prox  Full           Yes      Yes                                 +---------+---------------+---------+-----------+----------+--------------+ FV Mid   Full           Yes      Yes                                 +---------+---------------+---------+-----------+----------+--------------+ FV DistalFull           Yes      Yes                                 +---------+---------------+---------+-----------+----------+--------------+ PFV      Full                                                         +---------+---------------+---------+-----------+----------+--------------+ POP      Partial        Yes      Yes                  Acute          +---------+---------------+---------+-----------+----------+--------------+ PTV      Full                                                        +---------+---------------+---------+-----------+----------+--------------+ PERO     None           Humphrey       Humphrey                   Acute          +---------+---------------+---------+-----------+----------+--------------+   +---------+---------------+---------+-----------+----------+--------------+ LEFT     CompressibilityPhasicitySpontaneityPropertiesThrombus Aging +---------+---------------+---------+-----------+----------+--------------+ CFV      Full           Yes      Yes                                 +---------+---------------+---------+-----------+----------+--------------+ SFJ      Full                                                        +---------+---------------+---------+-----------+----------+--------------+ FV Prox  Full           Yes      Yes                                 +---------+---------------+---------+-----------+----------+--------------+  FV Mid   Full           Yes      Yes                                 +---------+---------------+---------+-----------+----------+--------------+ FV DistalFull           Yes      Yes                                 +---------+---------------+---------+-----------+----------+--------------+ PFV      Full                                                        +---------+---------------+---------+-----------+----------+--------------+ POP      Full           Yes      Yes                                 +---------+---------------+---------+-----------+----------+--------------+ PTV      Full                                                         +---------+---------------+---------+-----------+----------+--------------+ PERO     Full                                                        +---------+---------------+---------+-----------+----------+--------------+     Summary: BILATERAL: -Humphrey evidence of popliteal cyst, bilaterally. RIGHT: - Findings consistent with acute deep vein thrombosis involving the right popliteal vein, and right peroneal veins.  LEFT: - There is Humphrey evidence of deep vein thrombosis in the lower extremity.  *See table(s) above for measurements and observations. Electronically signed by Deitra Mayo MD on 08/17/2022 at 6:23:04 PM.    Final    ECHOCARDIOGRAM COMPLETE  Result Date: 08/17/2022    ECHOCARDIOGRAM REPORT   Patient Name:   Dewain Penning Date of Exam: 08/17/2022 Medical Rec #:  [id number]              Height:       75.0 in Accession #:    [id number]             Weight:       205.0 lb Date of Birth:  [birth year]              BSA:          2.218 m Patient Age:    31 years               BP:           135/86 mmHg Patient Gender: M                      HR:  71 bpm. Exam Location:  Inpatient Procedure: 2D Echo Indications:    pulmonary embolus  History:        Patient has prior history of Echocardiogram examinations, most                 recent 09/23/2011. Arrythmias:Atrial Flutter.  Sonographer:    Harvie Junior Referring Phys: [id number] [place]  1. Left ventricular ejection fraction, by estimation, is 55 to 60%. The left ventricle has normal function. The left ventricle has Humphrey regional wall motion abnormalities. Left ventricular diastolic parameters were normal.  2. Right ventricular systolic function is normal. The right ventricular size is normal. There is normal pulmonary artery systolic pressure.  3. The mitral valve is normal in structure. Trivial mitral valve regurgitation. Humphrey evidence of mitral stenosis.  4. The aortic valve is tricuspid. Aortic valve regurgitation is not  visualized. Humphrey aortic stenosis is present.  5. The inferior vena cava is normal in size with greater than 50% respiratory variability, suggesting right atrial pressure of 3 mmHg. FINDINGS  Left Ventricle: Left ventricular ejection fraction, by estimation, is 55 to 60%. The left ventricle has normal function. The left ventricle has Humphrey regional wall motion abnormalities. The left ventricular internal cavity size was normal in size. There is  Humphrey left ventricular hypertrophy. Left ventricular diastolic parameters were normal. Right Ventricle: The right ventricular size is normal. Humphrey increase in right ventricular wall thickness. Right ventricular systolic function is normal. There is normal pulmonary artery systolic pressure. The tricuspid regurgitant velocity is 2.09 m/s, and  with an assumed right atrial pressure of 3 mmHg, the estimated right ventricular systolic pressure is 64.4 mmHg. Left Atrium: Left atrial size was normal in size. Right Atrium: Right atrial size was normal in size. Pericardium: There is Humphrey evidence of pericardial effusion. Mitral Valve: The mitral valve is normal in structure. Trivial mitral valve regurgitation. Humphrey evidence of mitral valve stenosis. Tricuspid Valve: The tricuspid valve is normal in structure. Tricuspid valve regurgitation is mild . Humphrey evidence of tricuspid stenosis. Aortic Valve: The aortic valve is tricuspid. Aortic valve regurgitation is not visualized. Humphrey aortic stenosis is present. Aortic valve mean gradient measures 3.0 mmHg. Aortic valve peak gradient measures 6.6 mmHg. Aortic valve area, by VTI measures 3.69 cm. Pulmonic Valve: The pulmonic valve was normal in structure. Pulmonic valve regurgitation is not visualized. Humphrey evidence of pulmonic stenosis. Aorta: The aortic root is normal in size and structure. Venous: The inferior vena cava is normal in size with greater than 50% respiratory variability, suggesting right atrial pressure of 3 mmHg. IAS/Shunts: Humphrey atrial level  shunt detected by color flow Doppler.  LEFT VENTRICLE PLAX 2D LVIDd:         5.20 cm      Diastology LVIDs:         3.30 cm      LV e' medial:    11.30 cm/s LV PW:         1.10 cm      LV E/e' medial:  6.4 LV IVS:        0.90 cm      LV e' lateral:   16.90 cm/s LVOT diam:     2.10 cm      LV E/e' lateral: 4.3 LV SV:         79 LV SV Index:   35 LVOT Area:     3.46 cm  LV Volumes (MOD) LV vol d, MOD A2C: 117.0 ml LV  vol d, MOD A4C: 150.0 ml LV vol s, MOD A2C: 42.6 ml LV vol s, MOD A4C: 55.2 ml LV SV MOD A2C:     74.4 ml LV SV MOD A4C:     150.0 ml LV SV MOD BP:      83.6 ml RIGHT VENTRICLE RV Basal diam:  3.20 cm RV Mid diam:    2.60 cm RV S prime:     13.10 cm/s TAPSE (M-mode): 2.1 cm LEFT ATRIUM             Index        RIGHT ATRIUM           Index LA diam:        3.20 cm 1.44 cm/m   RA Area:     15.90 cm LA Vol (A2C):   31.0 ml 13.98 ml/m  RA Volume:   42.80 ml  19.30 ml/m LA Vol (A4C):   38.9 ml 17.54 ml/m LA Biplane Vol: 37.1 ml 16.73 ml/m  AORTIC VALVE                    PULMONIC VALVE AV Area (Vmax):    3.38 cm     PV Vmax:       0.98 m/s AV Area (Vmean):   3.16 cm     PV Peak grad:  3.8 mmHg AV Area (VTI):     3.69 cm AV Vmax:           128.00 cm/s AV Vmean:          83.500 cm/s AV VTI:            0.213 m AV Peak Grad:      6.6 mmHg AV Mean Grad:      3.0 mmHg LVOT Vmax:         125.00 cm/s LVOT Vmean:        76.100 cm/s LVOT VTI:          0.227 m LVOT/AV VTI ratio: 1.07  AORTA Ao Root diam: 3.30 cm Ao Asc diam:  2.70 cm MITRAL VALVE               TRICUSPID VALVE MV Area (PHT): 3.72 cm    TR Peak grad:   17.5 mmHg MV Decel Time: 204 msec    TR Vmax:        209.00 cm/s MR Peak grad: 26.4 mmHg MR Vmax:      257.00 cm/s  SHUNTS MV E velocity: 72.00 cm/s  Systemic VTI:  0.23 m MV A velocity: 38.60 cm/s  Systemic Diam: 2.10 cm MV E/A ratio:  1.87 Jenkins Rouge MD Electronically signed by Jenkins Rouge MD Signature Date/Time: 08/17/2022/9:23:24 AM    Final    CT Angio Chest/Abd/Pel for Dissection W  and/or W/WO  Result Date: 08/16/2022 CLINICAL DATA:  Shortness of breath. Rule out acute aortic syndrome. EXAM: CT ANGIOGRAPHY CHEST, ABDOMEN AND PELVIS TECHNIQUE: Non-contrast CT of the chest was initially obtained. Multidetector CT imaging through the chest, abdomen and pelvis was performed using the standard protocol during bolus administration of intravenous contrast. Multiplanar reconstructed images and MIPs were obtained and reviewed to evaluate the vascular anatomy. RADIATION DOSE REDUCTION: This exam was performed according to the departmental dose-optimization program which includes automated exposure control, adjustment of the mA and/or kV according to patient size and/or use of iterative reconstruction technique. CONTRAST:  180m OMNIPAQUE IOHEXOL 350 MG/ML SOLN COMPARISON:  None Available. FINDINGS: CTA CHEST FINDINGS Cardiovascular:  Preferential opacification of the thoracic aorta. Pulse a shin artifact diminishes exam detail at the level of the ascending thoracic aorta and proximal arch. Humphrey evidence of thoracic aortic aneurysm or dissection. Normal heart size. Humphrey pericardial effusion. Although there is suboptimal pulmonary arterial opacification there is signs of acute pulmonary embolus within the posterior right lower lobar and segmental pulmonary arteries, image 66/6. Mediastinum/Nodes: Humphrey enlarged mediastinal, hilar, or axillary lymph nodes. Thyroid gland, trachea, and esophagus demonstrate Humphrey significant findings. Lungs/Pleura: Exam detail is diminished due to motion artifact. Ground-glass and airspace disease noted within the posterior right base, image 103/5. Likely pulmonary infarct. Subsegmental atelectasis noted in the left base. Subpleural nodule in the right middle lobe measures 3 mm, image 102/5. Musculoskeletal: Humphrey chest wall abnormality. Humphrey acute or significant osseous findings. Review of the MIP images confirms the above findings. CTA ABDOMEN AND PELVIS FINDINGS VASCULAR Aorta: Normal  caliber aorta without aneurysm, dissection, vasculitis or significant stenosis. Celiac: Patent without evidence of aneurysm, dissection, vasculitis or significant stenosis. SMA: Patent without evidence of aneurysm, dissection, vasculitis or significant stenosis. Renals: Both renal arteries are patent without evidence of aneurysm, dissection, vasculitis, fibromuscular dysplasia or significant stenosis. IMA: Patent without evidence of aneurysm, dissection, vasculitis or significant stenosis. Inflow: Patent without evidence of aneurysm, dissection, vasculitis or significant stenosis. Veins: Humphrey obvious venous abnormality within the limitations of this arterial phase study. Review of the MIP images confirms the above findings. NON-VASCULAR Hepatobiliary: Humphrey focal liver abnormality is seen. Humphrey gallstones, gallbladder wall thickening, or biliary dilatation. Pancreas: Unremarkable. Humphrey pancreatic ductal dilatation or surrounding inflammatory changes. Spleen: Normal in size without focal abnormality. Adrenals/Urinary Tract: Normal appearance of the adrenal glands. Humphrey kidney mass or hydronephrosis. Urinary bladder is unremarkable. Stomach/Bowel: Stomach appears normal. Stomach appears normal. Appendix not visualized. Humphrey bowel wall thickening, inflammation, or distension. Lymphatic: Humphrey adenopathy. Reproductive: Prostate is unremarkable. Other: Humphrey free fluid or fluid collections identified Musculoskeletal: Humphrey acute or significant osseous findings. Review of the MIP images confirms the above findings. IMPRESSION: 1. Examination is positive for acute pulmonary embolus within the posterior right lower lobar and segmental pulmonary arteries. 2. Ground-glass and airspace disease within the posterior right base is favored to represent pulmonary infarct. 3. Humphrey evidence for aortic dissection or aneurysm. 4. Humphrey acute findings within the abdomen or pelvis. 5. 3 mm right middle lobe lung nodule. There are Humphrey specific follow-up  recommendations for this age group. Electronically Signed   By: Kerby Moors M.D.   On: 08/16/2022 11:03   DG Chest 2 View  Result Date: 08/15/2022 CLINICAL DATA:  SOB EXAM: CHEST - 2 VIEW COMPARISON:  Fall [id number] FINDINGS: The heart size and mediastinal contours are within normal limits. Both lungs are clear. The visualized skeletal structures are unremarkable. IMPRESSION: Humphrey active cardiopulmonary disease. Electronically Signed   By: Sammie Bench M.D.   On: 08/15/2022 09:57   DG Chest Port 1 View  Result Date: 08/06/2022 CLINICAL DATA:  Seizure. EXAM: PORTABLE CHEST 1 VIEW COMPARISON:  None Available. FINDINGS: Lung volumes are low. The heart is normal in size for technique. Bronchovascular crowding versus vascular congestion. Humphrey confluent consolidation. Humphrey pneumothorax or large pleural effusion IMPRESSION: Low lung volumes with bronchovascular crowding versus vascular congestion. Electronically Signed   By: Keith Rake M.D.   On: 08/06/2022 01:42   MR Brain W and Wo Contrast  Result Date: 08/06/2022 CLINICAL DATA:  Seizure EXAM: MRI HEAD WITHOUT AND WITH CONTRAST TECHNIQUE: Multiplanar, multiecho pulse sequences of the brain and surrounding  structures were obtained without and with intravenous contrast. CONTRAST:  9m GADAVIST GADOBUTROL 1 MMOL/ML IV SOLN COMPARISON:  05/08/2017 FINDINGS: Brain: Humphrey acute infarct, mass effect or extra-axial collection. Humphrey acute or chronic hemorrhage. 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 The midline structures are normal. Vascular: Major flow voids are preserved. Skull and upper cervical spine: Normal calvarium and skull base. Visualized upper cervical spine and soft tissues are normal. Sinuses/Orbits:Humphrey paranasal sinus fluid levels or advanced mucosal thickening. Humphrey mastoid or middle ear effusion. Normal orbits. IMPRESSION: 1. Humphrey acute intracranial abnormality. 2. Unchanged large area of cystic encephalomalacia  in the posterior left hemisphere. Electronically Signed   By: KUlyses JarredM.D.   On: 08/06/2022 00:51   CT Head Wo Contrast  Result Date: 08/05/2022 CLINICAL DATA:  Recent seizure activity and fall with headaches and neck pain, initial encounter EXAM: CT HEAD WITHOUT CONTRAST CT CERVICAL SPINE WITHOUT CONTRAST TECHNIQUE: Multidetector CT imaging of the head and cervical spine was performed following the standard protocol without intravenous contrast. Multiplanar CT image reconstructions of the cervical spine were also generated. RADIATION DOSE REDUCTION: This exam was performed according to the departmental dose-optimization program which includes automated exposure control, adjustment of the mA and/or kV according to patient size and/or use of iterative reconstruction technique. 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 Humphrey hemorrhage, acute infarction or space-occupying mass lesion is noted. Vascular: Humphrey hyperdense vessel or unexpected calcification. Skull: Prior craniotomy on the left is again noted. Sinuses/Orbits: Humphrey acute finding. Other: None. CT CERVICAL SPINE FINDINGS Alignment: Within normal limits. Skull base and vertebrae: 7 cervical segments are well visualized. 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 This involves both the vertebral body and the posterior elements particularly on the right. Humphrey compression deformity is seen. Humphrey acute fracture or acute facet abnormality is noted. Soft tissues and spinal canal: Surrounding soft tissue structures are within normal limits. Upper chest: Visualized lung apices are unremarkable. 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 Humphrey acute abnormality noted. CT of the cervical spine: Humphrey acute abnormality noted. 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 Electronically Signed   By: MInez CatalinaM.D.   On: 08/05/2022 23:47   CT Cervical Spine Wo Contrast  Result Date: 08/05/2022 CLINICAL DATA:  Recent seizure activity and fall with headaches and neck pain, initial encounter EXAM: CT HEAD WITHOUT CONTRAST CT CERVICAL SPINE WITHOUT CONTRAST TECHNIQUE: Multidetector CT imaging of the head and cervical spine was performed following the standard protocol without intravenous contrast. Multiplanar CT image reconstructions of the cervical spine were also generated. RADIATION DOSE REDUCTION: This exam was performed according to the departmental dose-optimization program which includes automated exposure control, adjustment of the mA and/or kV according to patient size and/or use of iterative reconstruction technique. 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 Humphrey hemorrhage, acute infarction or space-occupying mass lesion is noted. Vascular: Humphrey hyperdense vessel or unexpected calcification. Skull: Prior craniotomy on the left is again noted. Sinuses/Orbits: Humphrey acute finding. Other: None. CT CERVICAL SPINE FINDINGS Alignment: Within normal limits. Skull base and vertebrae: 7 cervical segments are well visualized. 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 This involves both the vertebral body and the posterior elements particularly on the right. Humphrey compression deformity is seen. Humphrey acute fracture or acute facet abnormality is noted. Soft tissues and spinal canal: Surrounding soft tissue structures are within normal limits. Upper chest: Visualized lung apices are unremarkable. 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 Humphrey acute abnormality noted. CT of the cervical spine: Humphrey acute abnormality noted. 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 Electronically Signed   By: Inez Catalina M.D.   On: 08/05/2022 23:47    Microbiology: Results for orders placed or performed during the hospital encounter of 08/15/22  Resp panel by RT-PCR (RSV, Flu A&B, Covid) Anterior Nasal Swab     Status: None   Collection Time: 08/15/22  9:25 AM   Specimen: Anterior Nasal Swab  Result Value Ref Range Status   SARS Coronavirus 2 by RT PCR NEGATIVE NEGATIVE Final    Comment: (NOTE) SARS-CoV-2 target nucleic acids are NOT DETECTED.  The SARS-CoV-2 RNA is generally detectable in upper respiratory specimens during the acute phase of infection. The lowest concentration of SARS-CoV-2 viral copies this assay can detect is 138 copies/mL. A negative result does not preclude SARS-Cov-2 infection and should not be used as the sole basis for treatment or other patient management decisions. A negative result may occur with  improper specimen collection/handling, submission of specimen other than nasopharyngeal swab, presence of viral mutation(s) within the areas targeted by this assay, and inadequate number of viral copies(<138 copies/mL). A negative result must be combined with clinical observations, patient history, and epidemiological information. The expected result is Negative.  Fact Sheet for Patients:  EntrepreneurPulse.com.au  Fact Sheet for Healthcare Providers:  IncredibleEmployment.be  This test is Humphrey t yet approved or cleared by the [place] FDA and  has been authorized for detection and/or diagnosis of SARS-CoV-2 by FDA under an Emergency Use Authorization (EUA). This EUA will remain  in effect (meaning this test can be used) for the duration of the COVID-19 declaration under Section 564(b)(1) of the Act, 21 U.S.C.section 360bbb-3(b)(1), unless the authorization is terminated  or revoked sooner.       Influenza A by PCR NEGATIVE NEGATIVE Final   Influenza  B by PCR NEGATIVE NEGATIVE Final    Comment: (NOTE) The Xpert Xpress SARS-CoV-2/FLU/RSV plus assay is intended as an aid in the diagnosis of influenza from Nasopharyngeal swab specimens and should not be used as a sole basis for treatment. Nasal washings and aspirates are unacceptable for Xpert Xpress SARS-CoV-2/FLU/RSV testing.  Fact Sheet for Patients: EntrepreneurPulse.com.au  Fact Sheet for Healthcare Providers: IncredibleEmployment.be  This test is not yet approved or cleared by the [place] FDA and has been authorized for detection and/or diagnosis of SARS-CoV-2 by FDA under an Emergency Use Authorization (EUA). This EUA will remain in effect (meaning this test can be used) for the duration of the COVID-19 declaration under Section 564(b)(1) of the Act, 21 U.S.C. section 360bbb-3(b)(1), unless the authorization is terminated or revoked.     Resp Syncytial Virus by PCR NEGATIVE NEGATIVE Final    Comment: (NOTE) Fact Sheet for Patients: EntrepreneurPulse.com.au  Fact Sheet for Healthcare Providers: IncredibleEmployment.be  This test is not yet approved or cleared by the [place] FDA and has been authorized for detection and/or diagnosis of SARS-CoV-2 by FDA under an Emergency Use Authorization (EUA). This EUA will remain in effect (meaning this test can be used)  for the duration of the COVID-19 declaration under Section 564(b)(1) of the Act, 21 U.S.C. section 360bbb-3(b)(1), unless the authorization is terminated or revoked.  Performed at [facility], [place] [address] [place], [place] [address]     Labs: CBC: Recent Labs  Lab 08/15/22 [phone] 08/16/22 0941 08/17/22 0513  WBC 13.6* 16.6* 10.3  NEUTROABS 9.9* 13.1*  --   HGB 15.0 14.2 12.8*  HCT 43.2 42.0 38.1*  MCV 94.3 94.8 96.2  PLT 236 240 174   Basic Metabolic Panel: Recent Labs  Lab 08/15/22 0925  08/16/22 0941 08/17/22 0513  NA 136 136 137  K 4.1 4.2 4.6  CL 103 105 104  CO2 _0 GLUCOSE 104* 104* 91  BUN _1 CREATININE 1.03 1.09 1.04  CALCIUM 8.8* 8.9 9.0   Liver Function Tests: Recent Labs  Lab 08/15/22 0925 08/16/22 0941 08/17/22 0513  AST _2 ALT _3 ALKPHOS 57 67 55  BILITOT 0.5 0.8 0.6  PROT 7.4 8.3* 7.2  ALBUMIN 3.5 3.8 3.1*   CBG: Humphrey results for input(s): "GLUCAP" in the last 168 hours.  Discharge time spent: greater than 30 minutes.  Signed: Oswald Hillock, MD [facility] 08/19/2022

## 2022-08-19 NOTE — Plan of Care (Signed)

## 2022-08-19 NOTE — TOC Benefit Eligibility Note (Signed)
Patient Product/process development scientist completed.    The patient is currently admitted and upon discharge could be taking enoxaparin 80 mg/0.8 ml (Twice daily).  The current 30 day co-pay is $0.00.   The patient is insured through [employer] and [employer] [place] [employer]   Roland Earl, CPHT Pharmacy Patient Advocate Specialist [facility] Patient Advocate Team Direct Number: [phone]  Fax: [phone]

## 2022-08-19 NOTE — Discharge Instructions (Signed)
Enoxaparin injection What is this medicine? ENOXAPARIN (ee nox a PA rin) is used after knee, hip, or abdominal surgeries to prevent blood clotting. It is also used to treat existing blood clots in the lungs or in the veins. This medicine may be used for other purposes; ask your health care provider or pharmacist if you have questions. COMMON BRAND NAME(S): Lovenox  What should I tell my health care provider before I take this medicine? They need to know if you have any of these conditions: bleeding disorders, hemorrhage, or hemophilia infection of the heart or heart valves kidney or liver disease previous stroke prosthetic heart valve recent surgery or delivery of a baby ulcer in the stomach or intestine, diverticulitis, or other bowel disease an unusual or allergic reaction to enoxaparin, heparin, pork or pork products, other medicines, foods, dyes, or preservatives pregnant or trying to get pregnant breast-feeding  How should I use this medicine? This medicine is for injection under the skin. It is usually given by a health-care professional. You or a family member may be trained on how to give the injections. If you are to give yourself injections, make sure you understand how to use the syringe, measure the dose if necessary, and give the injection. To avoid bruising, do not rub the site where this medicine has been injected. Do not take your medicine more often than directed. Do not stop taking except on the advice of your doctor or health care professional. Make sure you receive a puncture-resistant container to dispose of the needles and syringes once you have finished with them. Do not reuse these items. Return the container to your doctor or health care professional for proper disposal. Talk to your pediatrician regarding the use of this medicine in children. Special care may be needed. Overdosage: If you think you have taken too much of this medicine contact a poison control center  or emergency room at once. NOTE: This medicine is only for you. Do not share this medicine with others.  See this Video for how to administer Enoxaparin: https://www.lovenox.com/patient-self-injection-video   What if I miss a dose? If you miss a dose, take it as soon as you can. If it is almost time for your next dose, take only that dose. Do not take double or extra doses.  What may interact with this medicine? aspirin and aspirin-like medicines certain medicines that treat or prevent blood clots dipyridamole NSAIDs, medicines for pain and inflammation, like ibuprofen or naproxen This list may not describe all possible interactions. Give your health care provider a list of all the medicines, herbs, non-prescription drugs, or dietary supplements you use. Also tell them if you smoke, drink alcohol, or use illegal drugs. Some items may interact with your medicine.  What should I watch for while using this medicine? Visit your healthcare professional for regular checks on your progress. You may need blood work done while you are taking this medicine. Your condition will be monitored carefully while you are receiving this medicine. It is important not to miss any appointments. If you are going to need surgery or other procedure, tell your healthcare professional that you are using this medicine. Using this medicine for a long time may weaken your bones and increase the risk of bone fractures. Avoid sports and activities that might cause injury while you are using this medicine. Severe falls or injuries can cause unseen bleeding. Be careful when using sharp tools or knives. Consider using an electric razor. Take special care brushing or   flossing your teeth. Report any injuries, bruising, or red spots on the skin to your healthcare professional. Wear a medical ID bracelet or chain. Carry a card that describes your disease and details of your medicine and dosage times.  What side effects may I  notice from receiving this medicine? Side effects that you should report to your doctor or health care professional as soon as possible: allergic reactions like skin rash, itching or hives, swelling of the face, lips, or tongue bone pain signs and symptoms of bleeding such as bloody or black, tarry stools; red or dark-brown urine; spitting up blood or brown material that looks like coffee grounds; red spots on the skin; unusual bruising or bleeding from the eye, gums, or nose signs and symptoms of a blood clot such as chest pain; shortness of breath; pain, swelling, or warmth in the leg signs and symptoms of a stroke such as changes in vision; confusion; trouble speaking or understanding; severe headaches; sudden numbness or weakness of the face, arm or leg; trouble walking; dizziness; loss of coordination Side effects that usually do not require medical attention (report to your doctor or health care professional if they continue or are bothersome): hair loss pain, redness, or irritation at site where injected This list may not describe all possible side effects. Call your doctor for medical advice about side effects. You may report side effects to FDA at 1-800-FDA-1088.  Where should I keep my medicine? Keep out of the reach of children. Store at room temperature between 15 and 30 degrees C (59 and 86 degrees F). Do not freeze. If your injections have been specially prepared, you may need to store them in the refrigerator. Ask your pharmacist. Throw away any unused medicine after the expiration date.   NOTE: This sheet is a summary. It may not cover all possible information. If you have questions about this medicine, talk to your doctor, pharmacist, or health care provider.  2020 Elsevier/Gold Standard (2017-08-06 11:25:34)

## 2022-08-19 NOTE — Progress Notes (Signed)
RN delivered meds to patient at bedside. No c/o at this time per patient. RN walked with patient to front entrance.

## 2022-08-19 NOTE — Progress Notes (Signed)
ANTICOAGULATION CONSULT NOTE - Initial Consult  Pharmacy Consult for Lovenox Indication: pulmonary embolus  No Known Allergies  Patient Measurements: Height: 6\' 3"  (190.5 cm) Weight: 93 kg (205 lb) IBW/kg (Calculated) : 84.5  Vital Signs: Temp: 98.3 F (36.8 C) (12/26 0834) Temp Source: Oral (12/26 0834) BP: 138/84 (12/26 0834) Pulse Rate: 64 (12/26 0834)  Labs: Recent Labs    08/16/22 0941 08/17/22 0513  HGB 14.2 12.8*  HCT 42.0 38.1*  PLT 240 243  CREATININE 1.09 1.04  CKTOTAL 140  --   TROPONINIHS 5  --      Estimated Creatinine Clearance: 124.1 mL/min (by C-G formula based on SCr of 1.04 mg/dL).   Medical History: Past Medical History:  Diagnosis Date   Arachnoid cyst    s/p drainage by craniotomy   Cerebral palsy (HCC)    mild   Epilepsy (HCC)    Headache    Paroxysmal atrial flutter (HCC)    brief, post sz on 09/22/11   Seizure (HCC)    Seizures (HCC)    last June 2020    Medications:  Scheduled:   carbamazepine  600 mg Oral BID   enoxaparin (LOVENOX) injection  1 mg/kg Subcutaneous Q12H   levETIRAcetam  1,500 mg Oral BID   pneumococcal 20-valent conjugate vaccine  0.5 mL Intramuscular Tomorrow-1000   Infusions:  PRN: acetaminophen **OR** acetaminophen, albuterol, oxyCODONE, prochlorperazine  Assessment: 31 yo male with history of seizures presents with shortness of breath, CTa +PE.  Pharmacy consulted to dose Lovenox.  Goal of Therapy:  Anti-Xa level 0.6-1 units/ml 4hrs after LMWH dose given Monitor platelets by anticoagulation protocol: Yes   Plan:  Continue Lovenox 1mg /kg Subq q12h Monitor CBC q72h or as per provider, signs/symptoms of bleeding F/u long term anticoag plan Would avoid DOAC due to drug interaction with carbamazepine   Thank you for allowing pharmacy to be a part of this patient's care.  26, PharmD, BCPS Clinical Pharmacist [place] Please utilize Amion for appropriate phone number to reach the  unit pharmacist [facility]) 08/19/2022 9:16 AM

## 2022-08-26 ENCOUNTER — Other Ambulatory Visit: Payer: Self-pay

## 2022-08-26 ENCOUNTER — Encounter: Payer: Self-pay | Admitting: Diagnostic Neuroimaging

## 2022-08-26 ENCOUNTER — Ambulatory Visit (INDEPENDENT_AMBULATORY_CARE_PROVIDER_SITE_OTHER): Payer: 59 | Admitting: Diagnostic Neuroimaging

## 2022-08-26 VITALS — BP 121/79 | HR 79 | Ht 75.0 in | Wt 192.0 lb

## 2022-08-26 DIAGNOSIS — G40009 Localization-related (focal) (partial) idiopathic epilepsy and epileptic syndromes with seizures of localized onset, not intractable, without status epilepticus: Secondary | ICD-10-CM | POA: Diagnosis not present

## 2022-08-26 MED ORDER — LEVETIRACETAM 750 MG PO TABS
1500.0000 mg | ORAL_TABLET | Freq: Two times a day (BID) | ORAL | 4 refills | Status: DC
  Filled 2022-08-26 – 2022-09-10 (×3): qty 120, 30d supply, fill #0
  Filled 2022-10-06 – 2022-10-10 (×3): qty 120, 30d supply, fill #1
  Filled 2022-11-25: qty 120, 30d supply, fill #2
  Filled 2023-01-03: qty 120, 30d supply, fill #3
  Filled 2023-02-16: qty 120, 30d supply, fill #4

## 2022-08-26 MED ORDER — TEGRETOL-XR 200 MG PO TB12
600.0000 mg | ORAL_TABLET | Freq: Two times a day (BID) | ORAL | 4 refills | Status: DC
  Filled 2022-08-26: qty 180, 30d supply, fill #0
  Filled 2022-10-06 – 2022-10-10 (×3): qty 540, 90d supply, fill #0
  Filled 2023-01-03 – 2023-02-16 (×2): qty 540, 90d supply, fill #1

## 2022-08-26 NOTE — Patient Instructions (Addendum)
  SEIZURE DISRORDER - continue levetiracetam 1500mg  twice a day  - continue carbamazepine XR 600mg  twice a day - consider epidiolex, vimpat, onfi in future

## 2022-08-26 NOTE — Progress Notes (Signed)
[facility]  PATIENT: Ricky Humphrey DOB: 1991-02-04  REFERRING CLINICIAN: No ref. provider found HISTORY FROM: patient  REASON FOR VISIT: follow up   HISTORICAL  CHIEF COMPLAINT:  Chief Complaint  Patient presents with   Follow-up    Patient in room #6 with wife and child. Pt here today to f/u seizure and hospital visit.    HISTORY OF PRESENT ILLNESS:   UPDATE (08/26/22, VRP): Since last visit, had breakthrough sz in Aug and Dec 2023 (possibly missed doses of meds). Then later in Dec 2023 had SOB, dx with right leg DVT and PE. Now on lovenox. Has family hx of blood clots in mother, maternal grandmother, maternal cousins, maternal aunt.   UPDATE (01/22/22, VRP): Since last visit, doing well since last year; no seizures. Recently dx'd with rotavirus, and having some nausea / vomiting.    UPDATE (01/23/21, VRP): Since last visit, doing well. Symptoms are stable. No alleviating or aggravating factors. Tolerating meds. Had 2-3 seizures in Sept 2021 (low CBZ level; may have run out of meds). Had [place] in winter 2021, but had mild sxs and now a good recovery.   REVIEW OF SYSTEMS: Full 14 system review of systems performed and negative with exception of: as per HPI.  ALLERGIES: No Known Allergies  HOME MEDICATIONS: Outpatient Medications Prior to Visit  Medication Sig Dispense Refill   albuterol (PROVENTIL) (5 MG/ML) 0.5% nebulizer solution Take 0.5 mLs (2.5 mg total) by nebulization every 6 (six) hours as needed for wheezing or shortness of breath. 20 mL 12   enoxaparin (LOVENOX) 150 MG/ML injection Inject 0.94 mLs (140 mg total) into the skin at bedtime. 30 mL 2   methocarbamol (ROBAXIN) 500 MG tablet Take 1 tablet (500 mg total) by mouth 2 (two) times daily. 20 tablet 0   Multiple Vitamins-Minerals (MULTIVITAL PO) Take 1 tablet by mouth daily.     Oxycodone HCl 10 MG TABS Take 1 tablet (10 mg total) by mouth every 6 (six) hours as needed for moderate pain.  20 tablet 0   levETIRAcetam (KEPPRA) 750 MG tablet Take 2 tablets by mouth 2 times daily. 360 tablet 4   TEGRETOL-XR 200 MG 12 hr tablet Take 3 tablets by mouth 2 times daily. 540 tablet 4   No facility-administered medications prior to visit.    PAST MEDICAL HISTORY: Past Medical History:  Diagnosis Date   Arachnoid cyst    s/p drainage by craniotomy   Cerebral palsy (HCC)    mild   Epilepsy ([place])    Headache    Paroxysmal atrial flutter (HCC)    brief, post sz on 09/22/11   Seizure ([place])    Seizures ([place])    last June 2020    PAST SURGICAL HISTORY: Past Surgical History:  Procedure Laterality Date   acrnoid cyst     HAND SURGERY  07/2019   fracture   TONSILLECTOMY AND ADENOIDECTOMY      FAMILY HISTORY: Family History  Problem Relation Age of Onset   Migraines Mother    Diabetes Mother    Asthma Brother     SOCIAL HISTORY: Social History   Socioeconomic History   Marital status: Married    Spouse name: Estill Bamberg   Number of children: 1   Years of education: [employer] education level: Some college, no degree  Occupational History   Occupation: Buyer, retail: [employer]  Tobacco Use   Smoking status: Never   Smokeless tobacco: Never  Vaping  Use   Vaping Use: Never used  Substance and Sexual Activity   Alcohol use: No    Alcohol/week: 0.0 standard drinks of alcohol   Drug use: No   Sexual activity: Not on file  Other Topics Concern   Not on file  Social History Narrative   Patient lives with a roommate.   First child, daughter born May 2019   [place] at [employer].    Caffeine Use: 2-3 cups daily   Social Determinants of Health   Financial Resource Strain: Not on file  Food Insecurity: Food Insecurity Present (08/16/2022)   Hunger Vital Sign    Worried About Running Out of Food in the Last Year: Sometimes true    Ran Out of Food in the Last Year: Sometimes true  Transportation Needs: No Transportation Needs (08/16/2022)   PRAPARE -  Hydrologist (Medical): No    Lack of Transportation (Non-Medical): No  Physical Activity: Not on file  Stress: Not on file  Social Connections: Not on file  Intimate Partner Violence: Not At Risk (08/16/2022)   Humiliation, Afraid, Rape, and Kick questionnaire    Fear of Current or Ex-Partner: No    Emotionally Abused: No    Physically Abused: No    Sexually Abused: No     PHYSICAL EXAM  GENERAL EXAM/CONSTITUTIONAL: Vitals:  Vitals:   08/26/22 1353  BP: 121/79  Pulse: 79  Weight: 192 lb (87.1 kg)  Height: 6\' 3"  (1.905 m)   Body mass index is 24 kg/m. Wt Readings from Last 3 Encounters:  08/26/22 192 lb (87.1 kg)  08/16/22 205 lb (93 kg)  08/06/22 205 lb 11 oz (93.3 kg)   Patient is in no distress; well developed, nourished and groomed; neck is supple  CARDIOVASCULAR: Examination of carotid arteries is normal; no carotid bruits Regular rate and rhythm, no murmurs Examination of peripheral vascular system by observation and palpation is normal  EYES: Ophthalmoscopic exam of optic discs and posterior segments is normal; no papilledema or hemorrhages No results found.  MUSCULOSKELETAL: Gait, strength, tone, movements noted in Neurologic exam below  NEUROLOGIC: MENTAL STATUS:      No data to display         awake, alert, oriented to person, place and time recent and remote memory intact normal attention and concentration language fluent, comprehension intact, naming intact fund of knowledge appropriate  CRANIAL NERVE:  2nd - no papilledema on fundoscopic exam 2nd, 3rd, 4th, 6th - pupils equal and reactive to light, visual fields full to confrontation, extraocular muscles intact, no nystagmus 5th - facial sensation symmetric 7th - facial strength symmetric 8th - hearing intact 9th - palate elevates symmetrically, uvula midline 11th - shoulder shrug symmetric 12th - tongue protrusion midline  MOTOR:  normal bulk and tone,  full strength in the BUE, BLE  SENSORY:  normal and symmetric to light touch  COORDINATION:  finger-nose-finger, fine finger movements normal  REFLEXES:  deep tendon reflexes present and symmetric  GAIT/STATION:  narrow based gait     DIAGNOSTIC DATA (LABS, IMAGING, TESTING) - I reviewed patient records, labs, notes, testing and imaging myself where available.  Lab Results  Component Value Date   WBC 10.3 08/17/2022   HGB 12.8 (L) 08/17/2022   HCT 38.1 (L) 08/17/2022   MCV 96.2 08/17/2022   PLT 243 08/17/2022      Component Value Date/Time   NA 137 08/17/2022 0513   NA 140 01/23/2021 1350   K 4.6  08/17/2022 0513   CL 104 08/17/2022 0513   CO2 27 08/17/2022 0513   GLUCOSE 91 08/17/2022 0513   BUN 12 08/17/2022 0513   BUN 11 01/23/2021 1350   CREATININE 1.04 08/17/2022 0513   CALCIUM 9.0 08/17/2022 0513   PROT 7.2 08/17/2022 0513   PROT 6.4 01/23/2021 1350   ALBUMIN 3.1 (L) 08/17/2022 0513   ALBUMIN 4.3 01/23/2021 1350   AST 17 08/17/2022 0513   ALT 20 08/17/2022 0513   ALKPHOS 55 08/17/2022 0513   BILITOT 0.6 08/17/2022 0513   BILITOT 0.3 01/23/2021 1350   GFRNONAA >60 08/17/2022 0513   GFRAA >60 05/05/2020 0830   No results found for: "CHOL", "HDL", "LDLCALC", "LDLDIRECT", "TRIG", "CHOLHDL" Lab Results  Component Value Date   HGBA1C 4.8 06/14/2016   No results found for: "VITAMINB12" Lab Results  Component Value Date   TSH 0.618 06/13/2016    08/17/22 BLE u/s BILATERAL:  -No evidence of popliteal cyst, bilaterally.  RIGHT:  - Findings consistent with acute deep vein thrombosis involving the right  popliteal vein, and right peroneal veins.  LEFT:  - There is no evidence of deep vein thrombosis in the lower extremity.   08/17/22 TTE 1. Left ventricular ejection fraction, by estimation, is 55 to 60%. The  left ventricle has normal function. The left ventricle has no regional  wall motion abnormalities. Left ventricular diastolic parameters were   normal.   2. Right ventricular systolic function is normal. The right ventricular  size is normal. There is normal pulmonary artery systolic pressure.   3. The mitral valve is normal in structure. Trivial mitral valve  regurgitation. No evidence of mitral stenosis.   4. The aortic valve is tricuspid. Aortic valve regurgitation is not  visualized. No aortic stenosis is present.   5. The inferior vena cava is normal in size with greater than 50%  respiratory variability, suggesting right atrial pressure of 3 mmHg.    ASSESSMENT AND PLAN  32 y.o. year old male here with:   Dx:  1. Localization-related idiopathic epilepsy and epileptic syndromes with seizures of localized onset, not intractable, without status epilepticus (HCC)     PLAN:  SEIZURE DISRORDER (breakthrough sz in Aug and Dec 2023; likely due to missed meds) - continue levetiracetam 1500mg  twice a day  - continue carbamazepine XR 600mg  twice a day - consider epidiolex, vimpat, onfi in future  DVT / PE  - follow up with PCP; consider hematology consult (due to family hx of DVT / PE) - continue lovenox  Meds ordered this encounter  Medications   levETIRAcetam (KEPPRA) 750 MG tablet    Sig: Take 2 tablets (1,500 mg total) by mouth 2 (two) times daily.    Dispense:  360 tablet    Refill:  4   TEGRETOL-XR 200 MG 12 hr tablet    Sig: Take 3 tablets (600 mg total) by mouth 2 (two) times daily.    Dispense:  540 tablet    Refill:  4   Return in about 6 months (around 02/24/2023).    , MD 08/26/2022, 3:40 PM Certified in Neurology, Neurophysiology and Neuroimaging  [facility] [address] [place], [address] [place] [phone]

## 2022-08-27 ENCOUNTER — Other Ambulatory Visit (HOSPITAL_COMMUNITY): Payer: Self-pay

## 2022-08-27 ENCOUNTER — Other Ambulatory Visit: Payer: Self-pay

## 2022-09-09 ENCOUNTER — Other Ambulatory Visit (HOSPITAL_COMMUNITY): Payer: Self-pay

## 2022-09-10 ENCOUNTER — Other Ambulatory Visit (HOSPITAL_COMMUNITY): Payer: Self-pay

## 2022-09-10 ENCOUNTER — Other Ambulatory Visit: Payer: Self-pay

## 2022-09-11 ENCOUNTER — Other Ambulatory Visit (HOSPITAL_COMMUNITY): Payer: Self-pay

## 2022-09-11 ENCOUNTER — Telehealth: Payer: Self-pay

## 2022-09-11 NOTE — Telephone Encounter (Signed)
The [place] forms received, placed on MD desk for review and signature.

## 2022-09-15 ENCOUNTER — Ambulatory Visit: Payer: Medicaid Other | Admitting: Diagnostic Neuroimaging

## 2022-09-16 ENCOUNTER — Other Ambulatory Visit (HOSPITAL_COMMUNITY): Payer: Self-pay

## 2022-09-17 ENCOUNTER — Other Ambulatory Visit: Payer: Self-pay

## 2022-09-18 ENCOUNTER — Other Ambulatory Visit (HOSPITAL_COMMUNITY): Payer: Self-pay

## 2022-09-18 NOTE — Telephone Encounter (Signed)
Contacted pt to get first day out of work/ date to return to work. She questioned why disability was sent to [place] as he originally filed for Knee complications. He then had a seizure and blood clot. Advised we can only fill out the neurologic information for which we see him for is seizures.  Forms placed in MR for pick up.

## 2022-10-06 ENCOUNTER — Other Ambulatory Visit (HOSPITAL_COMMUNITY): Payer: Self-pay

## 2022-10-07 ENCOUNTER — Other Ambulatory Visit: Payer: Self-pay

## 2022-10-08 ENCOUNTER — Encounter: Payer: Self-pay | Admitting: Diagnostic Neuroimaging

## 2022-10-08 ENCOUNTER — Encounter: Payer: Self-pay | Admitting: Pulmonary Disease

## 2022-10-08 ENCOUNTER — Ambulatory Visit (INDEPENDENT_AMBULATORY_CARE_PROVIDER_SITE_OTHER): Payer: 59 | Admitting: Pulmonary Disease

## 2022-10-08 VITALS — BP 122/80 | HR 65 | Temp 98.4°F | Ht 75.0 in | Wt 188.8 lb

## 2022-10-08 DIAGNOSIS — I824Y9 Acute embolism and thrombosis of unspecified deep veins of unspecified proximal lower extremity: Secondary | ICD-10-CM | POA: Diagnosis not present

## 2022-10-08 LAB — D-DIMER, QUANTITATIVE: D-Dimer, Quant: <0.19 mcg/mL FEU (ref <0.50)

## 2022-10-08 NOTE — Patient Instructions (Addendum)
Provoked pulmonary embolism: Check D-dimer today If that test is normal then we will have you stop Lovenox injections on February 24 However, if you still have an elevated D-dimer test today then we will continue Lovenox injections at 140 mg nightly.  For now we will plan on having you come back and see me in 3 months. However, if the D-dimer test is normal then you do not need to follow-up unless you have recurrent respiratory problems.  Claim number [id number] Fax number [phone]

## 2022-10-08 NOTE — Progress Notes (Signed)
Synopsis: Referred in 09/2022 for DVT and PE that was diagnosed in 07/2022 after he had a knee injury in 06/2022  Subjective:   PATIENT ID: Ricky Humphrey GENDER: male DOB: [birth year], MRN: [id number]   HPI  Chief Complaint  Patient presents with   Consult    Pt diagnosed with blood clots in December, had pulmonary embolism     This is a pleasant 32 year old male who comes to our clinic today for evaluation and management of a pulmonary embolism that he experienced in December 2023.  On December 12 the patient had a knee injury on the right side at work when he was pushing a heavy object.  After that he developed leg pain and swelling.  Then on December 22 he developed chest pain and shortness of breath.  He went to the emergency department and was diagnosed with a pulmonary embolism on the right.  He was started on Lovenox during hospitalization and maintained on this because of concern that oral medicines would interact with his multiple seizure medicines.  He continues to take Lovenox at home.  Leg pain and swelling has improved.  Knee injury signs and symptoms have improved significantly as well and he has regained a significant degree of mobility and no longer has pain in the knee.  He has not had any bleeding with Lovenox.  He is currently using 140 mg Lovenox nightly.  This regimen was prescribed while hospitalized.  No chest pain or shortness of breath at this point.  He is here to discuss whether or not he should come off of the Lovenox.  Record review: 07/2022 hospital discharge summary reviewed where the patient was diagnosed with PE, provoked.  Treated with lovenox rather than doac to avoid interaction with anti-epileptics  Past Medical History:  Diagnosis Date   Arachnoid cyst    s/p drainage by craniotomy   Cerebral palsy (HCC)    mild   Epilepsy ([place])    Headache    Paroxysmal atrial flutter (HCC)    brief, post sz on 09/22/11   Seizure ([place])    Seizures  ([place])    last June 2020     Family History  Problem Relation Age of Onset   Migraines Mother    Diabetes Mother    Asthma Brother      Social History   Socioeconomic History   Marital status: Married    Spouse name: Estill Bamberg   Number of children: 1   Years of education: [employer] education level: Some college, no degree  Occupational History   Occupation: Buyer, retail: [employer]  Tobacco Use   Smoking status: Never   Smokeless tobacco: Never  Vaping Use   Vaping Use: Never used  Substance and Sexual Activity   Alcohol use: No    Alcohol/week: 0.0 standard drinks of alcohol   Drug use: No   Sexual activity: Not on file  Other Topics Concern   Not on file  Social History Narrative   Patient lives with a roommate.   First child, daughter born May 2019   [place] at [facility].    Caffeine Use: 2-3 cups daily   Social Determinants of Health   Financial Resource Strain: Not on file  Food Insecurity: Food Insecurity Present (08/16/2022)   Hunger Vital Sign    Worried About Running Out of Food in the Last Year: Sometimes true    Ran Out of Food in the Last Year: Sometimes true  Transportation  Needs: No Transportation Needs (08/16/2022)   PRAPARE - Hydrologist (Medical): No    Lack of Transportation (Non-Medical): No  Physical Activity: Not on file  Stress: Not on file  Social Connections: Not on file  Intimate Partner Violence: Not At Risk (08/16/2022)   Humiliation, Afraid, Rape, and Kick questionnaire    Fear of Current or Ex-Partner: No    Emotionally Abused: No    Physically Abused: No    Sexually Abused: No     No Known Allergies   Outpatient Medications Prior to Visit  Medication Sig Dispense Refill   albuterol (PROVENTIL) (5 MG/ML) 0.5% nebulizer solution Take 0.5 mLs (2.5 mg total) by nebulization every 6 (six) hours as needed for wheezing or shortness of breath. 20 mL 12   enoxaparin (LOVENOX) 150 MG/ML injection  Inject 0.94 mLs (140 mg total) into the skin at bedtime. 30 mL 2   levETIRAcetam (KEPPRA) 750 MG tablet Take 2 tablets (1,500 mg total) by mouth 2 (two) times daily. 360 tablet 4   methocarbamol (ROBAXIN) 500 MG tablet Take 1 tablet (500 mg total) by mouth 2 (two) times daily. 20 tablet 0   Multiple Vitamins-Minerals (MULTIVITAL PO) Take 1 tablet by mouth daily.     Oxycodone HCl 10 MG TABS Take 1 tablet (10 mg total) by mouth every 6 (six) hours as needed for moderate pain. 20 tablet 0   TEGRETOL-XR 200 MG 12 hr tablet Take 3 tablets by mouth 2 times daily. 540 tablet 4   No facility-administered medications prior to visit.    Review of Systems  Constitutional:  Negative for chills, fever, malaise/fatigue and weight loss.  HENT:  Negative for congestion, nosebleeds, sinus pain and sore throat.   Eyes:  Negative for photophobia, pain and discharge.  Respiratory:  Negative for cough, hemoptysis, sputum production, shortness of breath and wheezing.   Cardiovascular:  Negative for chest pain, palpitations, orthopnea and leg swelling.  Gastrointestinal:  Negative for abdominal pain, constipation, diarrhea, nausea and vomiting.  Genitourinary:  Negative for dysuria, frequency, hematuria and urgency.  Musculoskeletal:  Negative for back pain, joint pain, myalgias and neck pain.  Skin:  Negative for itching and rash.  Neurological:  Negative for tingling, tremors, sensory change, speech change, focal weakness, seizures, weakness and headaches.  Psychiatric/Behavioral:  Negative for memory loss, substance abuse and suicidal ideas. The patient is not nervous/anxious.       Objective:  Physical Exam   Vitals:   10/08/22 1526  BP: 122/80  Pulse: 65  Temp: 98.4 F (36.9 C)  TempSrc: Oral  SpO2: 100%  Weight: 188 lb 12.8 oz (85.6 kg)  Height: 6' 3"$  (1.905 m)    Gen: well appearing HENT: OP clear, neck supple PULM: CTA B, normal effort  CV: RRR, no mgr GI: BS+, soft, nontender Derm:  no cyanosis or rash Psyche: normal mood and affect   CBC    Component Value Date/Time   WBC 10.3 08/17/2022 0513   RBC 3.96 (L) 08/17/2022 0513   HGB 12.8 (L) 08/17/2022 0513   HGB 14.5 01/23/2021 1350   HCT 38.1 (L) 08/17/2022 0513   HCT 42.5 01/23/2021 1350   PLT 243 08/17/2022 0513   PLT 242 01/23/2021 1350   MCV 96.2 08/17/2022 0513   MCV 94 01/23/2021 1350   MCH 32.3 08/17/2022 0513   MCHC 33.6 08/17/2022 0513   RDW 12.9 08/17/2022 0513   RDW 12.7 01/23/2021 1350   LYMPHSABS 1.3 08/16/2022  0941   LYMPHSABS 1.5 01/23/2021 1350   MONOABS 2.1 (H) 08/16/2022 0941   EOSABS 0.0 08/16/2022 0941   EOSABS 0.1 01/23/2021 1350   BASOSABS 0.1 08/16/2022 0941   BASOSABS 0.0 01/23/2021 1350     Chest imaging: 07/2022 CT angiogram chest > right lower lobe pulmonary embolism, no pulmonary parenchymal abnormality  PFT:  Labs:  Path:  Echo:  Heart Catheterization:       Assessment & Plan:   Deep vein thrombosis (DVT) of proximal lower extremity, unspecified chronicity, unspecified laterality (HCC) - Plan: D-Dimer, Quantitative  Discussion: 32 year old male with provoked pulmonary embolism.  Currently tolerating Lovenox well.  I talked to him about next steps for treatment.  Guideline recommendations state that we should anticoagulate for 3 to 6 months.  It sounds as if he has had near complete but not total complete resolution of the knee pain and swelling.  He is now mobile again and starting to exercise to a light degree.  Some studies have suggested that D-dimer can be predictive of recurrence of pulmonary embolism.  Unfortunately we do not have a baseline D-dimer test to go on.  I told him that we could check it today and if it is totally normal then it would be fine to stop Lovenox after 3 months of therapy which would be February 24.  However, if still elevated it may be reasonable for [place] to keep him on Lovenox for another 3 months.  Plan: Provoked pulmonary  embolism: Check D-dimer today If that test is normal then we will have you stop Lovenox injections on February 24 However, if you still have an elevated D-dimer test today then we will continue Lovenox injections at 140 mg nightly.  For now we will plan on having you come back and see me in 3 months. However, if the D-dimer test is normal then you do not need to follow-up unless you have recurrent respiratory problems.  Claim number [id number] Fax number [phone]  Immunizations: Immunization History  Administered Date(s) Administered   Influenza Split 09/23/2011   Influenza,inj,Quad PF,6+ Mos 06/14/2016, 05/09/2017, 08/19/2022   Tdap 06/14/2016     Current Outpatient Medications:    albuterol (PROVENTIL) (5 MG/ML) 0.5% nebulizer solution, Take 0.5 mLs (2.5 mg total) by nebulization every 6 (six) hours as needed for wheezing or shortness of breath., Disp: 20 mL, Rfl: 12   enoxaparin (LOVENOX) 150 MG/ML injection, Inject 0.94 mLs (140 mg total) into the skin at bedtime., Disp: 30 mL, Rfl: 2   levETIRAcetam (KEPPRA) 750 MG tablet, Take 2 tablets (1,500 mg total) by mouth 2 (two) times daily., Disp: 360 tablet, Rfl: 4   methocarbamol (ROBAXIN) 500 MG tablet, Take 1 tablet (500 mg total) by mouth 2 (two) times daily., Disp: 20 tablet, Rfl: 0   Multiple Vitamins-Minerals (MULTIVITAL PO), Take 1 tablet by mouth daily., Disp: , Rfl:    Oxycodone HCl 10 MG TABS, Take 1 tablet (10 mg total) by mouth every 6 (six) hours as needed for moderate pain., Disp: 20 tablet, Rfl: 0   TEGRETOL-XR 200 MG 12 hr tablet, Take 3 tablets by mouth 2 times daily., Disp: 540 tablet, Rfl: 4

## 2022-10-10 ENCOUNTER — Other Ambulatory Visit: Payer: Self-pay

## 2022-10-10 ENCOUNTER — Other Ambulatory Visit (HOSPITAL_COMMUNITY): Payer: Self-pay

## 2022-10-10 NOTE — Progress Notes (Signed)
Spoke with Ricky Humphrey and notified of results per Dr. Lake Bells Ricky Humphrey verbalized understanding and denied any questions. He is asking if we can write him a letter to return to work and if he needs any restrictions when he returns. Please advise, thanks!

## 2022-10-14 NOTE — Progress Notes (Signed)
Ricky Doom, MD  Rosana Berger, CMA OK to send a letter that says he can return to work on 2/24, no restrictions    Tried calling the pt and there was no answer and line rings and then turns to busy tone. Unable to leave a msg. Will call back.

## 2022-12-17 ENCOUNTER — Telehealth: Payer: Self-pay | Admitting: Diagnostic Neuroimaging

## 2022-12-17 NOTE — Telephone Encounter (Signed)
Sent mychart msg informing pt of appointment change due to provider template change 

## 2023-01-05 ENCOUNTER — Other Ambulatory Visit: Payer: Self-pay

## 2023-01-05 ENCOUNTER — Other Ambulatory Visit (HOSPITAL_COMMUNITY): Payer: Self-pay

## 2023-01-06 ENCOUNTER — Other Ambulatory Visit: Payer: Self-pay

## 2023-01-06 ENCOUNTER — Other Ambulatory Visit (HOSPITAL_COMMUNITY): Payer: Self-pay

## 2023-01-14 ENCOUNTER — Other Ambulatory Visit (HOSPITAL_COMMUNITY): Payer: Self-pay

## 2023-02-21 ENCOUNTER — Other Ambulatory Visit (HOSPITAL_COMMUNITY): Payer: Self-pay

## 2023-03-05 ENCOUNTER — Ambulatory Visit: Payer: 59 | Admitting: Diagnostic Neuroimaging

## 2023-03-09 ENCOUNTER — Ambulatory Visit: Payer: 59 | Admitting: Diagnostic Neuroimaging

## 2023-03-09 ENCOUNTER — Encounter: Payer: Self-pay | Admitting: Diagnostic Neuroimaging

## 2023-03-09 ENCOUNTER — Other Ambulatory Visit (HOSPITAL_COMMUNITY): Payer: Self-pay

## 2023-03-09 VITALS — BP 120/64 | HR 67 | Ht 75.0 in | Wt 181.0 lb

## 2023-03-09 DIAGNOSIS — G40009 Localization-related (focal) (partial) idiopathic epilepsy and epileptic syndromes with seizures of localized onset, not intractable, without status epilepticus: Secondary | ICD-10-CM

## 2023-03-09 DIAGNOSIS — R519 Headache, unspecified: Secondary | ICD-10-CM | POA: Diagnosis not present

## 2023-03-09 MED ORDER — LEVETIRACETAM 750 MG PO TABS
1500.0000 mg | ORAL_TABLET | Freq: Two times a day (BID) | ORAL | 4 refills | Status: DC
  Filled 2023-03-09: qty 360, 90d supply, fill #0
  Filled 2023-04-09: qty 120, 30d supply, fill #0
  Filled 2023-05-19: qty 120, 30d supply, fill #1
  Filled 2023-06-15: qty 120, 30d supply, fill #2
  Filled 2023-07-29: qty 120, 30d supply, fill #3
  Filled 2023-09-10: qty 120, 30d supply, fill #4
  Filled 2023-10-06: qty 120, 30d supply, fill #5
  Filled 2023-12-01: qty 120, 30d supply, fill #6
  Filled 2024-01-02 – 2024-01-13 (×3): qty 120, 30d supply, fill #7
  Filled 2024-02-03 – 2024-02-05 (×2): qty 120, 30d supply, fill #8

## 2023-03-09 MED ORDER — TEGRETOL-XR 200 MG PO TB12
600.0000 mg | ORAL_TABLET | Freq: Two times a day (BID) | ORAL | 4 refills | Status: DC
  Filled 2023-03-09 – 2023-06-15 (×2): qty 540, 90d supply, fill #0
  Filled 2023-10-06: qty 540, 90d supply, fill #1
  Filled 2024-02-03: qty 540, 90d supply, fill #2

## 2023-03-09 NOTE — Patient Instructions (Signed)
  NEW ONSET HEADACHES (worsening; daily; since May 2024) - check MRI brain w/wo - consider topiramate  SEIZURE DISRORDER (breakthrough sz in Aug and Dec 2023) - continue levetiracetam 1500mg  twice a day  - continue carbamazepine XR 600mg  twice a day - consider epidiolex, vimpat, onfi in future

## 2023-03-09 NOTE — Progress Notes (Signed)
[facility]  PATIENT: Ricky Humphrey DOB: 1991/01/09  REFERRING CLINICIAN: Nathaneil Canary, PA-C HISTORY FROM: patient  REASON FOR VISIT: follow up   HISTORICAL  CHIEF COMPLAINT:  Chief Complaint  Patient presents with   Follow-up    Rm 7, pt is alone Follow up on seizures. States has not had any seizures since January/2024. States doing well overall.  Pt states he has been having headaches daily.  States headaches come with light sensitivity.     HISTORY OF PRESENT ILLNESS:   UPDATE (03/09/23, VRP): Since last visit, doing well, except onset of headaches in May 2024. Daily, general HA. No nausea. Some sens to light. No seizures.   UPDATE (08/26/22, VRP): Since last visit, had breakthrough sz in Aug and Dec 2023 (possibly missed doses of meds). Then later in Dec 2023 had SOB, dx with right leg DVT and PE. Now on lovenox. Has family hx of blood clots in mother, maternal grandmother, maternal cousins, maternal aunt.   UPDATE (01/22/22, VRP): Since last visit, doing well since last year; no seizures. Recently dx'd with rotavirus, and having some nausea / vomiting.    UPDATE (01/23/21, VRP): Since last visit, doing well. Symptoms are stable. No alleviating or aggravating factors. Tolerating meds. Had 2-3 seizures in Sept 2021 (low CBZ level; may have run out of meds). Had COVID in winter 2021, but had mild sxs and now a good recovery.   REVIEW OF SYSTEMS: Full 14 system review of systems performed and negative with exception of: as per HPI.  ALLERGIES: No Known Allergies  HOME MEDICATIONS: Outpatient Medications Prior to Visit  Medication Sig Dispense Refill   albuterol (PROVENTIL) (5 MG/ML) 0.5% nebulizer solution Take 0.5 mLs (2.5 mg total) by nebulization every 6 (six) hours as needed for wheezing or shortness of breath. 20 mL 12   levETIRAcetam (KEPPRA) 750 MG tablet Take 2 tablets (1,500 mg total) by mouth 2 (two) times daily. 360 tablet 4    TEGRETOL-XR 200 MG 12 hr tablet Take 3 tablets (600 mg total) by mouth 2 (two) times daily. 540 tablet 4   enoxaparin (LOVENOX) 150 MG/ML injection Inject 0.94 mLs (140 mg total) into the skin at bedtime. 30 mL 2   methocarbamol (ROBAXIN) 500 MG tablet Take 1 tablet (500 mg total) by mouth 2 (two) times daily. (Patient not taking: Reported on 03/09/2023) 20 tablet 0   Multiple Vitamins-Minerals (MULTIVITAL PO) Take 1 tablet by mouth daily.     Oxycodone HCl 10 MG TABS Take 1 tablet (10 mg total) by mouth every 6 (six) hours as needed for moderate pain. 20 tablet 0   No facility-administered medications prior to visit.    PAST MEDICAL HISTORY: Past Medical History:  Diagnosis Date   Arachnoid cyst    s/p drainage by craniotomy   Cerebral palsy (HCC)    mild   Epilepsy (HCC)    Headache    Paroxysmal atrial flutter (HCC)    brief, post sz on 09/22/11   Seizure (HCC)    Seizures (HCC)    last June 2020    PAST SURGICAL HISTORY: Past Surgical History:  Procedure Laterality Date   acrnoid cyst     HAND SURGERY  07/2019   fracture   TONSILLECTOMY AND ADENOIDECTOMY      FAMILY HISTORY: Family History  Problem Relation Age of Onset   Migraines Mother    Diabetes Mother    Asthma Brother     SOCIAL HISTORY: Social History  Socioeconomic History   Marital status: Married    Spouse name: Marchelle Folks   Number of children: 1   Years of education: [employer] education level: Some college, no degree  Occupational History   Occupation: Event organiser: [employer]  Tobacco Use   Smoking status: Never   Smokeless tobacco: Never  Vaping Use   Vaping status: Never Used  Substance and Sexual Activity   Alcohol use: No    Alcohol/week: 0.0 standard drinks of alcohol   Drug use: No   Sexual activity: Not on file  Other Topics Concern   Not on file  Social History Narrative   Patient lives with a roommate.   First child, daughter born May 2019   College at [employer].     Caffeine Use: 2-3 cups daily   Social Determinants of Health   Financial Resource Strain: Low Risk  (08/29/2022)   Received from [facility], [facility]   Overall Financial Resource Strain (CARDIA)    Difficulty of Paying Living Expenses: Not hard at all  Food Insecurity: No Food Insecurity (08/29/2022)   Received from [facility], [facility]   Hunger Vital Sign    Worried About Running Out of Food in the Last Year: Never true    Ran Out of Food in the Last Year: Never true  Recent Concern: Food Insecurity - Food Insecurity Present (08/16/2022)   Hunger Vital Sign    Worried About Running Out of Food in the Last Year: Sometimes true    Ran Out of Food in the Last Year: Sometimes true  Transportation Needs: No Transportation Needs (08/29/2022)   Received from [employer], [facility]   PRAPARE - Transportation    Lack of Transportation (Medical): No    Lack of Transportation (Non-Medical): No  Physical Activity: Not on file  Stress: Not on file  Social Connections: Unknown (12/31/2021)   Received from [facility], [facility]   Social Network    Social Network: Not on file  Intimate Partner Violence: Not At Risk (08/16/2022)   Humiliation, Afraid, Rape, and Kick questionnaire    Fear of Current or Ex-Partner: No    Emotionally Abused: No    Physically Abused: No    Sexually Abused: No     PHYSICAL EXAM  GENERAL EXAM/CONSTITUTIONAL: Vitals:  Vitals:   03/09/23 1421  BP: 120/64  Pulse: 67  Weight: 181 lb (82.1 kg)  Height: 6\' 3"  (1.905 m)   Body mass index is 22.62 kg/m. Wt Readings from Last 3 Encounters:  03/09/23 181 lb (82.1 kg)  10/08/22 188 lb 12.8 oz (85.6 kg)  08/26/22 192 lb (87.1 kg)   Patient is in no distress; well developed, nourished and groomed; neck is supple  CARDIOVASCULAR: Examination of carotid arteries is normal; no carotid bruits Regular rate and rhythm, no murmurs Examination of peripheral vascular system by observation and  palpation is normal  EYES: Ophthalmoscopic exam of optic discs and posterior segments is normal; no papilledema or hemorrhages No results found.  MUSCULOSKELETAL: Gait, strength, tone, movements noted in Neurologic exam below  NEUROLOGIC: MENTAL STATUS:      No data to display         awake, alert, oriented to person, place and time recent and remote memory intact normal attention and concentration language fluent, comprehension intact, naming intact fund of knowledge appropriate  CRANIAL NERVE:  2nd - no papilledema on fundoscopic exam 2nd, 3rd, 4th, 6th - pupils equal and reactive to  light, visual fields full to confrontation, extraocular muscles intact, no nystagmus 5th - facial sensation symmetric 7th - facial strength symmetric 8th - hearing intact 9th - palate elevates symmetrically, uvula midline 11th - shoulder shrug symmetric 12th - tongue protrusion midline  MOTOR:  normal bulk and tone, full strength in the BUE, BLE  SENSORY:  normal and symmetric to light touch  COORDINATION:  finger-nose-finger, fine finger movements normal  REFLEXES:  deep tendon reflexes present and symmetric  GAIT/STATION:  narrow based gait     DIAGNOSTIC DATA (LABS, IMAGING, TESTING) - I reviewed patient records, labs, notes, testing and imaging myself where available.  Lab Results  Component Value Date   WBC 10.3 08/17/2022   HGB 12.8 (L) 08/17/2022   HCT 38.1 (L) 08/17/2022   MCV 96.2 08/17/2022   PLT 243 08/17/2022      Component Value Date/Time   NA 137 08/17/2022 0513   NA 140 01/23/2021 1350   K 4.6 08/17/2022 0513   CL 104 08/17/2022 0513   CO2 27 08/17/2022 0513   GLUCOSE 91 08/17/2022 0513   BUN 12 08/17/2022 0513   BUN 11 01/23/2021 1350   CREATININE 1.04 08/17/2022 0513   CALCIUM 9.0 08/17/2022 0513   PROT 7.2 08/17/2022 0513   PROT 6.4 01/23/2021 1350   ALBUMIN 3.1 (L) 08/17/2022 0513   ALBUMIN 4.3 01/23/2021 1350   AST 17 08/17/2022 0513    ALT 20 08/17/2022 0513   ALKPHOS 55 08/17/2022 0513   BILITOT 0.6 08/17/2022 0513   BILITOT 0.3 01/23/2021 1350   GFRNONAA >60 08/17/2022 0513   GFRAA >60 05/05/2020 0830   No results found for: "CHOL", "HDL", "LDLCALC", "LDLDIRECT", "TRIG", "CHOLHDL" Lab Results  Component Value Date   HGBA1C 4.8 06/14/2016   No results found for: "VITAMINB12" Lab Results  Component Value Date   TSH 0.618 06/13/2016    08/17/22 BLE u/s BILATERAL:  -No evidence of popliteal cyst, bilaterally.  RIGHT:  - Findings consistent with acute deep vein thrombosis involving the right  popliteal vein, and right peroneal veins.  LEFT:  - There is no evidence of deep vein thrombosis in the lower extremity.   08/17/22 TTE 1. Left ventricular ejection fraction, by estimation, is 55 to 60%. The  left ventricle has normal function. The left ventricle has no regional  wall motion abnormalities. Left ventricular diastolic parameters were  normal.   2. Right ventricular systolic function is normal. The right ventricular  size is normal. There is normal pulmonary artery systolic pressure.   3. The mitral valve is normal in structure. Trivial mitral valve  regurgitation. No evidence of mitral stenosis.   4. The aortic valve is tricuspid. Aortic valve regurgitation is not  visualized. No aortic stenosis is present.   5. The inferior vena cava is normal in size with greater than 50%  respiratory variability, suggesting right atrial pressure of 3 mmHg.    ASSESSMENT AND PLAN  32 y.o. year old male here with:   Dx:  1. Localization-related idiopathic epilepsy and epileptic syndromes with seizures of localized onset, not intractable, without status epilepticus (HCC)   2. New onset headache      PLAN:  NEW ONSET HEADACHES (worsening; daily; since May 2024) - check MRI brain w/wo - consider topiramate  SEIZURE DISRORDER (breakthrough sz in Aug and Dec 2023; likely due to missed meds) - continue  levetiracetam 1500mg  twice a day  - continue carbamazepine XR 600mg  twice a day - consider epidiolex, vimpat, onfi  in future  DVT / PE  - lovenox completed (end of Feb 2024)  Meds ordered this encounter  Medications   levETIRAcetam (KEPPRA) 750 MG tablet    Sig: Take 2 tablets (1,500 mg total) by mouth 2 (two) times daily.    Dispense:  360 tablet    Refill:  4   TEGRETOL-XR 200 MG 12 hr tablet    Sig: Take 3 tablets (600 mg total) by mouth 2 (two) times daily.    Dispense:  540 tablet    Refill:  4   Return in about 1 year (around 03/08/2024).    Suanne Marker, MD 03/09/2023, 2:53 PM Certified in Neurology, Neurophysiology and Neuroimaging  [facility] [address] [place], [place] [address] [phone]

## 2023-03-10 ENCOUNTER — Other Ambulatory Visit (HOSPITAL_COMMUNITY): Payer: Self-pay

## 2023-03-10 ENCOUNTER — Telehealth: Payer: Self-pay | Admitting: Diagnostic Neuroimaging

## 2023-03-10 NOTE — Telephone Encounter (Signed)
[employer] Berkley Harvey: [id number] exp. 03/10/23-8/30/24Rolene Arbour medicaid NPR sent to GI [phone]

## 2023-04-09 ENCOUNTER — Other Ambulatory Visit (HOSPITAL_COMMUNITY): Payer: Self-pay

## 2023-04-18 ENCOUNTER — Other Ambulatory Visit: Payer: 59

## 2023-04-19 ENCOUNTER — Ambulatory Visit
Admission: RE | Admit: 2023-04-19 | Discharge: 2023-04-19 | Disposition: A | Discharge status: Home / Self Care | Payer: 59 | Source: Ambulatory Visit | Attending: Diagnostic Neuroimaging | Admitting: Diagnostic Neuroimaging

## 2023-04-19 DIAGNOSIS — G40009 Localization-related (focal) (partial) idiopathic epilepsy and epileptic syndromes with seizures of localized onset, not intractable, without status epilepticus: Secondary | ICD-10-CM | POA: Diagnosis not present

## 2023-04-19 DIAGNOSIS — R519 Headache, unspecified: Secondary | ICD-10-CM

## 2023-04-19 MED ORDER — GADOPICLENOL 0.5 MMOL/ML IV SOLN
9.0000 mL | Freq: Once | INTRAVENOUS | Status: AC | PRN
  Administered 2023-04-19: 9 mL via INTRAVENOUS

## 2023-04-29 ENCOUNTER — Telehealth: Payer: Self-pay

## 2023-04-29 NOTE — Telephone Encounter (Signed)
Called patient to informed him per Dr Dohmeier "Stable imaging results. No new findings. -VRP" Pt verbalized understanding. Pt had no questions at this time but was encouraged to call back if questions arise.

## 2023-04-29 NOTE — Telephone Encounter (Signed)
-----   Message from Suanne Marker sent at 04/28/2023  5:21 PM EDT ----- Stable imaging results. No new findings. -VRP

## 2023-05-06 ENCOUNTER — Encounter (HOSPITAL_COMMUNITY): Payer: Self-pay

## 2023-05-06 ENCOUNTER — Other Ambulatory Visit: Payer: Self-pay

## 2023-05-06 ENCOUNTER — Emergency Department (HOSPITAL_COMMUNITY): Payer: 59

## 2023-05-06 ENCOUNTER — Emergency Department (HOSPITAL_COMMUNITY)
Admission: EM | Admit: 2023-05-06 | Discharge: 2023-05-06 | Disposition: A | Discharge status: Home / Self Care | Payer: 59 | Attending: Emergency Medicine | Admitting: Emergency Medicine

## 2023-05-06 DIAGNOSIS — S00502A Unspecified superficial injury of oral cavity, initial encounter: Secondary | ICD-10-CM | POA: Insufficient documentation

## 2023-05-06 DIAGNOSIS — R7989 Other specified abnormal findings of blood chemistry: Secondary | ICD-10-CM | POA: Insufficient documentation

## 2023-05-06 DIAGNOSIS — X58XXXA Exposure to other specified factors, initial encounter: Secondary | ICD-10-CM | POA: Diagnosis not present

## 2023-05-06 DIAGNOSIS — R569 Unspecified convulsions: Secondary | ICD-10-CM | POA: Insufficient documentation

## 2023-05-06 LAB — CBC WITH DIFFERENTIAL/PLATELET
Abs Immature Granulocytes: 0.06 10*3/uL (ref 0.00–0.07)
Basophils Absolute: 0 10*3/uL (ref 0.0–0.1)
Basophils Relative: 1 %
Eosinophils Absolute: 0.2 10*3/uL (ref 0.0–0.5)
Eosinophils Relative: 3 %
HCT: 43.9 % (ref 39.0–52.0)
Hemoglobin: 14.4 g/dL (ref 13.0–17.0)
Immature Granulocytes: 1 %
Lymphocytes Relative: 18 %
Lymphs Abs: 1.2 10*3/uL (ref 0.7–4.0)
MCH: 33.1 pg (ref 26.0–34.0)
MCHC: 32.8 g/dL (ref 30.0–36.0)
MCV: 100.9 fL — ABNORMAL HIGH (ref 80.0–100.0)
Monocytes Absolute: 0.6 10*3/uL (ref 0.1–1.0)
Monocytes Relative: 10 %
Neutro Abs: 4.5 10*3/uL (ref 1.7–7.7)
Neutrophils Relative %: 67 %
Platelets: 195 10*3/uL (ref 150–400)
RBC: 4.35 MIL/uL (ref 4.22–5.81)
RDW: 12.6 % (ref 11.5–15.5)
WBC: 6.6 10*3/uL (ref 4.0–10.5)
nRBC: 0 % (ref 0.0–0.2)

## 2023-05-06 LAB — CARBAMAZEPINE LEVEL, TOTAL: Carbamazepine Lvl: 2 ug/mL — ABNORMAL LOW (ref 4.0–12.0)

## 2023-05-06 LAB — BASIC METABOLIC PANEL
Anion gap: 8 (ref 5–15)
BUN: 11 mg/dL (ref 6–20)
CO2: 21 mmol/L — ABNORMAL LOW (ref 22–32)
Calcium: 8.4 mg/dL — ABNORMAL LOW (ref 8.9–10.3)
Chloride: 108 mmol/L (ref 98–111)
Creatinine, Ser: 1.35 mg/dL — ABNORMAL HIGH (ref 0.61–1.24)
GFR, Estimated: >60 mL/min (ref >60)
Glucose, Bld: 78 mg/dL (ref 70–99)
Potassium: 4 mmol/L (ref 3.5–5.1)
Sodium: 137 mmol/L (ref 135–145)

## 2023-05-06 MED ORDER — BUTAMBEN-TETRACAINE-BENZOCAINE 2-2-14 % EX AERO
1.0000 | INHALATION_SPRAY | Freq: Once | CUTANEOUS | Status: AC
  Administered 2023-05-06: 1 via TOPICAL
  Filled 2023-05-06: qty 20

## 2023-05-06 MED ORDER — LEVETIRACETAM 500 MG PO TABS
1500.0000 mg | ORAL_TABLET | Freq: Once | ORAL | Status: AC
  Administered 2023-05-06: 1500 mg via ORAL
  Filled 2023-05-06: qty 3

## 2023-05-06 MED ORDER — CARBAMAZEPINE ER 200 MG PO TB12
600.0000 mg | ORAL_TABLET | Freq: Once | ORAL | Status: AC
  Administered 2023-05-06: 600 mg via ORAL
  Filled 2023-05-06: qty 3

## 2023-05-06 NOTE — ED Notes (Signed)
Pt has areas observed to tongue where he bit down on it during seizures.

## 2023-05-06 NOTE — ED Notes (Signed)
Pt discharged home. Discharge information discussed. No s/s of distress observed during discharge. 

## 2023-05-06 NOTE — Discharge Instructions (Addendum)
You were seen in the emergency department for evaluation of seizure.  You had some signs of tongue trauma.  Your lab work showed your Tegretol level to be mildly low.  Your Keppra level was still pending at time of discharge.  Please update your neurologist and see if they have any recommendations on medication adjustments.  Return to the emergency department if any worsening or concerning symptoms.

## 2023-05-06 NOTE — ED Notes (Signed)
Left foot observed with callus area which is painful to touch, but no redness observed.

## 2023-05-06 NOTE — ED Provider Notes (Signed)
[place] EMERGENCY DEPARTMENT AT [facility] Provider Note   CSN: [id number] Arrival date & time: 05/06/23  1029     History  No chief complaint on file.   Ricky Humphrey is a 32 y.o. male.  He has a history of seizure disorder and is fairly well-controlled, last seizure over 7 months ago.  He said he was at home in bed and next thing he remembers he was in the ambulance.  He said his wife was home so she probably called the ambulance.  He denies any complaints right now.  He said he has been compliant with his medications.  No recent illness.  On review of prior medical records he just saw neurology in July and had an MRI done and August that showed a stable left parietal cyst with no acute change  The history is provided by the patient and the EMS personnel.  Seizures Seizure activity on arrival: no   Episode characteristics: generalized shaking   Postictal symptoms: confusion and somnolence   Return to baseline: yes   Timing:  Once Progression:  Resolved Recent head injury:  No recent head injuries PTA treatment:  None History of seizures: yes        Home Medications Prior to Admission medications   Medication Sig Start Date End Date Taking? Authorizing Provider  albuterol (PROVENTIL) (5 MG/ML) 0.5% nebulizer solution Take 0.5 mLs (2.5 mg total) by nebulization every 6 (six) hours as needed for wheezing or shortness of breath. 08/15/22   Peter Garter, PA  levETIRAcetam (KEPPRA) 750 MG tablet Take 2 tablets (1,500 mg total) by mouth 2 (two) times daily. 03/09/23 06/01/24  Penumalli, Glenford Bayley, MD  TEGRETOL-XR 200 MG 12 hr tablet Take 3 tablets (600 mg total) by mouth 2 (two) times daily. 03/09/23 06/01/24  Penumalli, Glenford Bayley, MD      Allergies    Patient has no known allergies.    Review of Systems   Review of Systems  Constitutional:  Negative for fever.  HENT:  Negative for sore throat.   Respiratory:  Negative for shortness of breath.    Cardiovascular:  Negative for chest pain.  Gastrointestinal:  Negative for abdominal pain.  Genitourinary:  Negative for dysuria.  Skin:  Negative for rash.  Neurological:  Positive for seizures.    Physical Exam Updated Vital Signs BP 127/89 (BP Location: Right Arm)   Pulse 68   Temp 97.8 F (36.6 C) (Oral)   Resp 16   Ht 6\' 3"  (1.905 m)   Wt 82.6 kg   SpO2 100%   BMI 22.75 kg/m  Physical Exam Vitals and nursing note reviewed.  Constitutional:      General: He is not in acute distress.    Appearance: Normal appearance. He is well-developed.  HENT:     Head: Normocephalic and atraumatic.     Mouth/Throat:     Comments: He has some dried blood around his face and some signs of trauma to the lateral right side of his tongue. Eyes:     Conjunctiva/sclera: Conjunctivae normal.  Cardiovascular:     Rate and Rhythm: Normal rate and regular rhythm.     Heart sounds: No murmur heard. Pulmonary:     Effort: Pulmonary effort is normal. No respiratory distress.     Breath sounds: Normal breath sounds.  Abdominal:     Palpations: Abdomen is soft.     Tenderness: There is no abdominal tenderness. There is no guarding or rebound.  Musculoskeletal:        General: No swelling.     Cervical back: Neck supple.  Skin:    General: Skin is warm and dry.     Capillary Refill: Capillary refill takes less than 2 seconds.     Comments: He has a painful sore on the bottom of his left foot.  On exam it looks to be a plantar wart.  He thinks there is a foreign body there so we will check an x-ray.  Neurological:     General: No focal deficit present.     Mental Status: He is alert.     Sensory: No sensory deficit.     Motor: No weakness.     ED Results / Procedures / Treatments   Labs (all labs ordered are listed, but only abnormal results are displayed) Labs Reviewed  BASIC METABOLIC PANEL - Abnormal; Notable for the following components:      Result Value   CO2 21 (*)     Creatinine, Ser 1.35 (*)    Calcium 8.4 (*)    All other components within normal limits  CBC WITH DIFFERENTIAL/PLATELET - Abnormal; Notable for the following components:   MCV 100.9 (*)    All other components within normal limits  CARBAMAZEPINE LEVEL, TOTAL - Abnormal; Notable for the following components:   Carbamazepine Lvl 2.0 (*)    All other components within normal limits  LEVETIRACETAM LEVEL    EKG EKG Interpretation Date/Time:  Wednesday May 06 2023 10:46:01 EDT Ventricular Rate:  63 PR Interval:  187 QRS Duration:  89 QT Interval:  408 QTC Calculation: 418 R Axis:   76  Text Interpretation: Sinus rhythm ST elevation suggests acute pericarditis No significant change since prior 12/23 Confirmed by Meridee Score [phone]) on 05/06/2023 10:59:09 AM  Radiology DG Foot Complete Left  Result Date: 05/06/2023 CLINICAL DATA:  Plantar wound.  Question foreign body. EXAM: LEFT FOOT - COMPLETE 3+ VIEW COMPARISON:  None Available. FINDINGS: Three views of the left foot. No evidence of fracture or dislocation. There is no evidence of arthropathy or other focal bone abnormality. Soft tissues are unremarkable. No radiopaque foreign body. IMPRESSION: Negative left foot radiographs. Electronically Signed   By: Orvan Falconer M.D.   On: 05/06/2023 12:25    Procedures Procedures    Medications Ordered in ED Medications - No data to display  ED Course/ Medical Decision Making/ A&P Clinical Course as of 05/06/23 1814  Wed May 06, 2023  1228 X-ray left foot does not show any obvious retained foreign body. [MB]    Clinical Course User Index [MB] Terrilee Files, MD                                 Medical Decision Making Amount and/or Complexity of Data Reviewed Labs: ordered. Radiology: ordered.  Risk Prescription drug management.   This patient complains of seizure; this involves an extensive number of treatment Options and is a complaint that carries with it a  high risk of complications and morbidity. The differential includes seizure, trauma, subtherapeutic on medication, infection  I ordered, reviewed and interpreted labs, which included CBC with normal white count normal hemoglobin, chemistries of mildly low bicarb elevated creatinine, carbamazepine subtherapeutic I ordered medication oral carbamazepine and Keppra and reviewed PMP when indicated. I ordered imaging studies which included x-rays left foot and I independently    visualized and interpreted imaging which showed  no acute fracture or foreign body Additional history obtained from patient's wife and EMS Previous records obtained and reviewed in epic including prior neurology notes Cardiac monitoring reviewed, sinus rhythm Social determinants considered, no significant barriers Critical Interventions: None  After the interventions stated above, I reevaluated the patient and found patient to be awake alert no distress Admission and further testing considered, no indications for admission or further workup at this time.  Recommended close follow-up with his neurology team.  Return instructions discussed         Final Clinical Impression(s) / ED Diagnoses Final diagnoses:  Seizure [facility])    Rx / DC Orders ED Discharge Orders     None         Terrilee Files, MD 05/06/23 1816

## 2023-05-06 NOTE — ED Triage Notes (Signed)
The pt was [facility]. The pts wife called due to seizure activity and taking a long time to recover from post ictal state. The patient does have a PMHx of seizures and takes tegretol and Keppra with compliance. Wife reports seizure activity last 1 minute apart and it was only 2.18g RFA, no meds administered. Urine sample at bedside. VS B/P 124/65, P 68, O2 Sats 99%RA, CBG 127.

## 2023-05-10 ENCOUNTER — Emergency Department (HOSPITAL_BASED_OUTPATIENT_CLINIC_OR_DEPARTMENT_OTHER): Payer: 59

## 2023-05-10 ENCOUNTER — Other Ambulatory Visit: Payer: Self-pay

## 2023-05-10 ENCOUNTER — Encounter (HOSPITAL_BASED_OUTPATIENT_CLINIC_OR_DEPARTMENT_OTHER): Payer: Self-pay

## 2023-05-10 ENCOUNTER — Emergency Department (HOSPITAL_BASED_OUTPATIENT_CLINIC_OR_DEPARTMENT_OTHER)
Admission: EM | Admit: 2023-05-10 | Discharge: 2023-05-10 | Disposition: A | Discharge status: Home / Self Care | Payer: 59

## 2023-05-10 ENCOUNTER — Emergency Department (HOSPITAL_COMMUNITY): Payer: 59

## 2023-05-10 DIAGNOSIS — Z91148 Patient's other noncompliance with medication regimen for other reason: Secondary | ICD-10-CM | POA: Diagnosis not present

## 2023-05-10 DIAGNOSIS — R42 Dizziness and giddiness: Secondary | ICD-10-CM | POA: Insufficient documentation

## 2023-05-10 DIAGNOSIS — R519 Headache, unspecified: Secondary | ICD-10-CM | POA: Diagnosis not present

## 2023-05-10 DIAGNOSIS — Z20822 Contact with and (suspected) exposure to covid-19: Secondary | ICD-10-CM | POA: Insufficient documentation

## 2023-05-10 DIAGNOSIS — R5381 Other malaise: Secondary | ICD-10-CM | POA: Diagnosis not present

## 2023-05-10 DIAGNOSIS — R569 Unspecified convulsions: Secondary | ICD-10-CM | POA: Insufficient documentation

## 2023-05-10 DIAGNOSIS — R0789 Other chest pain: Secondary | ICD-10-CM | POA: Insufficient documentation

## 2023-05-10 LAB — COMPREHENSIVE METABOLIC PANEL
ALT: 40 U/L (ref 0–44)
AST: 34 U/L (ref 15–41)
Albumin: 3.5 g/dL (ref 3.5–5.0)
Alkaline Phosphatase: 55 U/L (ref 38–126)
Anion gap: 4 — ABNORMAL LOW (ref 5–15)
BUN: 13 mg/dL (ref 6–20)
CO2: 27 mmol/L (ref 22–32)
Calcium: 8.5 mg/dL — ABNORMAL LOW (ref 8.9–10.3)
Chloride: 106 mmol/L (ref 98–111)
Creatinine, Ser: 1.02 mg/dL (ref 0.61–1.24)
GFR, Estimated: >60 mL/min (ref >60)
Glucose, Bld: 74 mg/dL (ref 70–99)
Potassium: 4.2 mmol/L (ref 3.5–5.1)
Sodium: 137 mmol/L (ref 135–145)
Total Bilirubin: 0.3 mg/dL (ref 0.3–1.2)
Total Protein: 6.4 g/dL — ABNORMAL LOW (ref 6.5–8.1)

## 2023-05-10 LAB — CBC
HCT: 42.5 % (ref 39.0–52.0)
Hemoglobin: 14.6 g/dL (ref 13.0–17.0)
MCH: 32.5 pg (ref 26.0–34.0)
MCHC: 34.4 g/dL (ref 30.0–36.0)
MCV: 94.7 fL (ref 80.0–100.0)
Platelets: 228 10*3/uL (ref 150–400)
RBC: 4.49 MIL/uL (ref 4.22–5.81)
RDW: 12.3 % (ref 11.5–15.5)
WBC: 7.3 10*3/uL (ref 4.0–10.5)
nRBC: 0 % (ref 0.0–0.2)

## 2023-05-10 LAB — RESP PANEL BY RT-PCR (RSV, FLU A&B, COVID)  RVPGX2
Influenza A by PCR: NEGATIVE
Influenza B by PCR: NEGATIVE
Resp Syncytial Virus by PCR: NEGATIVE
SARS Coronavirus 2 by RT PCR: NEGATIVE

## 2023-05-10 LAB — LIPASE, BLOOD: Lipase: 48 U/L (ref 11–51)

## 2023-05-10 LAB — MAGNESIUM: Magnesium: 1.7 mg/dL (ref 1.7–2.4)

## 2023-05-10 MED ORDER — GADOBUTROL 1 MMOL/ML IV SOLN
9.0000 mL | Freq: Once | INTRAVENOUS | Status: AC | PRN
  Administered 2023-05-10: 9 mL via INTRAVENOUS

## 2023-05-10 NOTE — ED Provider Notes (Signed)
[place] EMERGENCY DEPARTMENT AT [facility] Provider Note   CSN: [id number] Arrival date & time: 05/10/23  1046     History  Chief Complaint  Patient presents with   Seizures   Dizziness    Ricky Humphrey is a 32 y.o. male.  This is a 32 year old male with past history of subarachnoid cyst, seizures, atrial fibrillation here as a transfer from [facility].  Prior physician consulted neurology who recommended transfer here for MRI.   Seizures Dizziness      Home Medications Prior to Admission medications   Medication Sig Start Date End Date Taking? Authorizing Provider  albuterol (PROVENTIL) (5 MG/ML) 0.5% nebulizer solution Take 0.5 mLs (2.5 mg total) by nebulization every 6 (six) hours as needed for wheezing or shortness of breath. 08/15/22   Peter Garter, PA  levETIRAcetam (KEPPRA) 750 MG tablet Take 2 tablets (1,500 mg total) by mouth 2 (two) times daily. 03/09/23 06/01/24  Penumalli, Glenford Bayley, MD  TEGRETOL-XR 200 MG 12 hr tablet Take 3 tablets (600 mg total) by mouth 2 (two) times daily. 03/09/23 06/01/24  Penumalli, Glenford Bayley, MD      Allergies    Acetaminophen and Ibuprofen    Review of Systems   Review of Systems  Neurological:  Positive for dizziness and seizures.    Physical Exam Updated Vital Signs BP 128/80 (BP Location: Left Arm)   Pulse (!) 52   Temp 97.9 F (36.6 C) (Oral)   Resp 20   Ht 6\' 3"  (1.905 m)   Wt 82.6 kg   SpO2 100%   BMI 22.76 kg/m  Physical Exam Vitals reviewed.  Neurological:     General: No focal deficit present.     Mental Status: He is alert. Mental status is at baseline.     Gait: Gait normal.     ED Results / Procedures / Treatments   Labs (all labs ordered are listed, but only abnormal results are displayed) Labs Reviewed  COMPREHENSIVE METABOLIC PANEL - Abnormal; Notable for the following components:      Result Value   Calcium 8.5 (*)    Total Protein 6.4 (*)    Anion gap 4 (*)    All  other components within normal limits  RESP PANEL BY RT-PCR (RSV, FLU A&B, COVID)  RVPGX2  CBC  MAGNESIUM  LIPASE, BLOOD    EKG None  Radiology MR Brain W and Wo Contrast  Result Date: 05/10/2023 CLINICAL DATA:  Stroke, follow up EXAM: MRI HEAD WITHOUT AND WITH CONTRAST TECHNIQUE: Multiplanar, multiecho pulse sequences of the brain and surrounding structures were obtained without and with intravenous contrast. CONTRAST:  9mL GADAVIST GADOBUTROL 1 MMOL/ML IV SOLN COMPARISON:  Outside MRI head 04/19/2023 FINDINGS: Motion limited study. Brain: No acute infarction, hemorrhage, hydrocephalus, extra-axial collection or mass lesion. Similar chronic area of cystic encephalomalacia in the posterior left cerebral hemisphere. No pathologic enhancement. Vascular: Major arterial flow voids are maintained. Skull and upper cervical spine: Normal marrow signal. Sinuses/Orbits: Clear sinuses.  No acute orbital findings. Other: No mastoid effusions. IMPRESSION: 1. No evidence of acute intracranial abnormality. 2. Similar chronic area of cystic encephalomalacia in the posterior left cerebral hemisphere. Electronically Signed   By: Feliberto Harts M.D.   On: 05/10/2023 17:13   CT Head Wo Contrast  Result Date: 05/10/2023 CLINICAL DATA:  Headache.  Dizziness.  Seizure 4 days ago. EXAM: CT HEAD WITHOUT CONTRAST TECHNIQUE: Contiguous axial images were obtained from the base of the skull through the  vertex without intravenous contrast. RADIATION DOSE REDUCTION: This exam was performed according to the departmental dose-optimization program which includes automated exposure control, adjustment of the mA and/or kV according to patient size and/or use of iterative reconstruction technique. COMPARISON:  08/05/2022.  MRI, 04/19/2023. FINDINGS: Brain: No evidence of acute infarction, hemorrhage, hydrocephalus, extra-axial collection or mass lesion/mass effect. Focal cystic encephalomalacia centered on the posterior left  frontal and adjacent left parietal lobes, underlying a craniotomy flap, stable. Vascular: No hyperdense vessel or unexpected calcification. Skull: Left parietal craniotomy flap, stable. No fracture. No skull lesion. Sinuses/Orbits: Globes and orbits are unremarkable. Mild ethmoid sinus mucosal thickening. Other: None. IMPRESSION: 1. No acute intracranial abnormalities.  No change from prior exams. Electronically Signed   By: Amie Portland M.D.   On: 05/10/2023 11:59   DG Chest Portable 1 View  Result Date: 05/10/2023 CLINICAL DATA:  Dizziness, headache.  Chest pain. EXAM: PORTABLE CHEST 1 VIEW COMPARISON:  08/15/2022 FINDINGS: The lungs appear clear. Cardiac and mediastinal contours normal. No blunting of the costophrenic angles. No bony abnormality identified. IMPRESSION: 1. No active cardiopulmonary disease is radiographically apparent. Electronically Signed   By: Gaylyn Rong M.D.   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  Overlying left frontoparietal craniotomy changes. -No acute findings. Overall no change compared to 08/06/2022. " [TY]  1317 Spoke with nuerology who recommended MRI w/ and w/o.  [TY]    Clinical Course User Index [TY] Coral Spikes, DO                                 Medical Decision Making 32 year old male transferred here for MRI.  Plan-have ordered MRI.  Will reach out to neurology pending result.  No neurological deficits on my exam.  Reassessment-no acute findings on MRI.  Patient would like to go home.  Will have him  follow-up with his neurologist outpatient.  Amount and/or Complexity of Data Reviewed Labs: ordered. Radiology: ordered. ECG/medicine tests: ordered.  Risk Prescription drug management.           Final Clinical Impression(s) / ED Diagnoses Final diagnoses:  Vertigo  Seizures (HCC)    Rx / DC Orders ED Discharge Orders     None         Arletha Pili, DO 05/10/23 1742

## 2023-05-10 NOTE — ED Notes (Signed)
Pt updated on POC; family at bedside; SZ precautions;VSS and call light in reach.

## 2023-05-10 NOTE — ED Triage Notes (Signed)
Patient arrives with complaints of dizziness and persistent headache since he had a seizure 4 days ago.  Patient has been feeling off balance as well. Reports that he is compliant with his seizure medications at home.  Rates pain a 7/10.

## 2023-05-10 NOTE — ED Provider Notes (Signed)
[place] EMERGENCY DEPARTMENT AT [facility] Provider Note   CSN: [id number] Arrival date & time: 05/10/23  1046     History  Chief Complaint  Patient presents with   Seizures   Dizziness    Ricky Humphrey is a 32 y.o. male.  32 year old male presenting emergency department after having a seizure on 9/11.  He reports he does not feel like he is back to baseline continues to have global headache, generalized malaise.  Feels like he is dizzy and off balance.  Wife reports that he is stumbled just walking a few times.  Also complains of some chest discomfort as well yesterday, none currently.  No numbness tingling changes in sensation.  Reports being compliant with his medications since then.  No further seizure activity.   Seizures Dizziness      Home Medications Prior to Admission medications   Medication Sig Start Date End Date Taking? Authorizing Provider  albuterol (PROVENTIL) (5 MG/ML) 0.5% nebulizer solution Take 0.5 mLs (2.5 mg total) by nebulization every 6 (six) hours as needed for wheezing or shortness of breath. 08/15/22   Peter Garter, PA  levETIRAcetam (KEPPRA) 750 MG tablet Take 2 tablets (1,500 mg total) by mouth 2 (two) times daily. 03/09/23 06/01/24  Penumalli, Glenford Bayley, MD  TEGRETOL-XR 200 MG 12 hr tablet Take 3 tablets (600 mg total) by mouth 2 (two) times daily. 03/09/23 06/01/24  Penumalli, Glenford Bayley, MD      Allergies    Acetaminophen and Ibuprofen    Review of Systems   Review of Systems  Neurological:  Positive for dizziness and seizures.    Physical Exam Updated Vital Signs BP 127/78   Pulse (!) 57   Temp 98.4 F (36.9 C) (Oral)   Resp 17   Ht 6\' 3"  (1.905 m)   Wt 82.6 kg   SpO2 100%   BMI 22.76 kg/m  Physical Exam Vitals and nursing note reviewed.  Constitutional:      General: He is not in acute distress. HENT:     Head: Normocephalic.     Nose: Nose normal.     Mouth/Throat:     Mouth: Mucous membranes are  moist.  Eyes:     Conjunctiva/sclera: Conjunctivae normal.  Cardiovascular:     Rate and Rhythm: Normal rate and regular rhythm.  Pulmonary:     Effort: Pulmonary effort is normal.     Breath sounds: Normal breath sounds.  Abdominal:     General: Abdomen is flat. There is no distension.     Tenderness: There is no abdominal tenderness.  Musculoskeletal:        General: Normal range of motion.  Skin:    General: Skin is warm.  Neurological:     General: No focal deficit present.     Mental Status: He is alert and oriented to person, place, and time.     Cranial Nerves: No cranial nerve deficit.     Sensory: No sensory deficit.     Motor: No weakness.     Coordination: Coordination normal.     Gait: Gait normal.     Comments: Negative Romberg  Psychiatric:        Mood and Affect: Mood normal.        Behavior: Behavior normal.     ED Results / Procedures / Treatments   Labs (all labs ordered are listed, but only abnormal results are displayed) Labs Reviewed  COMPREHENSIVE METABOLIC PANEL - Abnormal; Notable for the following  components:      Result Value   Calcium 8.5 (*)    Total Protein 6.4 (*)    Anion gap 4 (*)    All other components within normal limits  RESP PANEL BY RT-PCR (RSV, FLU A&B, COVID)  RVPGX2  CBC  MAGNESIUM  LIPASE, BLOOD    EKG None  Radiology CT Head Wo Contrast  Result Date: 05/10/2023 CLINICAL DATA:  Headache.  Dizziness.  Seizure 4 days ago. EXAM: CT HEAD WITHOUT CONTRAST TECHNIQUE: Contiguous axial images were obtained from the base of the skull through the vertex without intravenous contrast. RADIATION DOSE REDUCTION: This exam was performed according to the departmental dose-optimization program which includes automated exposure control, adjustment of the mA and/or kV according to patient size and/or use of iterative reconstruction technique. COMPARISON:  08/05/2022.  MRI, 04/19/2023. FINDINGS: Brain: No evidence of acute infarction,  hemorrhage, hydrocephalus, extra-axial collection or mass lesion/mass effect. Focal cystic encephalomalacia centered on the posterior left frontal and adjacent left parietal lobes, underlying a craniotomy flap, stable. Vascular: No hyperdense vessel or unexpected calcification. Skull: Left parietal craniotomy flap, stable. No fracture. No skull lesion. Sinuses/Orbits: Globes and orbits are unremarkable. Mild ethmoid sinus mucosal thickening. Other: None. IMPRESSION: 1. No acute intracranial abnormalities.  No change from prior exams. Electronically Signed   By: Amie Portland M.D.   On: 05/10/2023 11:59   DG Chest Portable 1 View  Result Date: 05/10/2023 CLINICAL DATA:  Dizziness, headache.  Chest pain. EXAM: PORTABLE CHEST 1 VIEW COMPARISON:  08/15/2022 FINDINGS: The lungs appear clear. Cardiac and mediastinal contours normal. No blunting of the costophrenic angles. No bony abnormality identified. IMPRESSION: 1. No active cardiopulmonary disease is radiographically apparent. Electronically Signed   By: Gaylyn Rong M.D.   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  Overlying left frontoparietal craniotomy changes. -No acute findings. Overall no change compared to 08/06/2022. " [TY]  1317 Spoke with nuerology who recommended MRI w/ and w/o.  [TY]    Clinical Course User Index [TY] Coral Spikes, DO                                 Medical Decision Making Is a 32 year old male with complicated past medical history to include subarachnoid cyst status post drainage, seizures, A-fib.  He presented today for reevaluation after having seizure  on 911.  He is continue to have persistent headache and dizziness with being off balance.  Physical exam reassuring with no localizing neurodeficits.  No further seizure activity noted.  He seemingly ambulated in the room with steady gait, but describes the balance issue as somebody is pulling him down to the ground from the side.  Broad workup largely reassuring no fever tachycardia leukocytosis to suggest systemic infection.  Moving his neck freely.  No signs of Angiulli rotation.  Comprehensive panel with no significant metabolic derangements.  Normal kidney function.  No transaminitis to suggest hepatobiliary disease.  Magnesium not low.  Flu COVID-negative.  Case discussed with neurology recommending MRI with and without contrast given his history of  arachnoid cyst. Accepted by Dr. Rodena Medin for transfer to [facility].   Amount and/or Complexity of Data Reviewed Independent Historian: spouse    Details: Notes patient seemingly stumbling yesterday External Data Reviewed:     Details: See ED course; recent MRI Labs: ordered. Radiology: ordered. ECG/medicine tests: ordered.  Risk Decision regarding hospitalization. Diagnosis or treatment significantly limited by social determinants of health. Risk Details: Poor health literacy          Final Clinical Impression(s) / ED Diagnoses Final diagnoses:  None    Rx / DC Orders ED Discharge Orders     None         Coral Spikes, DO 05/10/23 1437

## 2023-05-10 NOTE — ED Notes (Signed)
Pt provided bag lunch

## 2023-05-10 NOTE — ED Notes (Signed)
Pt able to ambulate to restroom without assistance. Advised his coordination has improved but still feel drowsy. Pt assisted back to bed; call light in reach.

## 2023-05-10 NOTE — Discharge Instructions (Addendum)
Your MRI that was done was normal.  At this time, I do not of a clear cause for what is causing your symptoms.  I would like you to continue taking all your medications and follow-up with your neurologist.

## 2023-05-10 NOTE — ED Notes (Signed)
[facility] called for ED to Ed tx via [employer]

## 2023-05-10 NOTE — ED Notes (Signed)
Pt requesting food. RN notified provider

## 2023-05-14 LAB — LEVETIRACETAM LEVEL: Levetiracetam Lvl: <2 ug/mL — ABNORMAL LOW (ref 10.0–40.0)

## 2023-05-21 ENCOUNTER — Other Ambulatory Visit (HOSPITAL_COMMUNITY): Payer: Self-pay

## 2023-06-15 ENCOUNTER — Other Ambulatory Visit: Payer: Self-pay

## 2023-06-15 ENCOUNTER — Other Ambulatory Visit (HOSPITAL_COMMUNITY): Payer: Self-pay

## 2023-06-22 ENCOUNTER — Other Ambulatory Visit (HOSPITAL_COMMUNITY): Payer: Self-pay

## 2023-07-29 ENCOUNTER — Other Ambulatory Visit: Payer: Self-pay

## 2023-07-29 ENCOUNTER — Other Ambulatory Visit (HOSPITAL_COMMUNITY): Payer: Self-pay

## 2023-09-14 ENCOUNTER — Other Ambulatory Visit (HOSPITAL_COMMUNITY): Payer: Self-pay

## 2023-10-05 DIAGNOSIS — K56609 Unspecified intestinal obstruction, unspecified as to partial versus complete obstruction: Secondary | ICD-10-CM | POA: Insufficient documentation

## 2023-10-07 ENCOUNTER — Other Ambulatory Visit: Payer: Self-pay

## 2023-10-09 ENCOUNTER — Other Ambulatory Visit: Payer: Self-pay

## 2023-10-09 ENCOUNTER — Encounter: Payer: Self-pay | Admitting: Pharmacist

## 2023-10-13 ENCOUNTER — Other Ambulatory Visit: Payer: Self-pay

## 2024-01-08 ENCOUNTER — Other Ambulatory Visit (HOSPITAL_COMMUNITY): Payer: Self-pay

## 2024-01-12 ENCOUNTER — Other Ambulatory Visit (HOSPITAL_COMMUNITY): Payer: Self-pay

## 2024-01-13 ENCOUNTER — Other Ambulatory Visit (HOSPITAL_COMMUNITY): Payer: Self-pay

## 2024-02-03 ENCOUNTER — Other Ambulatory Visit: Payer: Self-pay

## 2024-02-03 ENCOUNTER — Other Ambulatory Visit (HOSPITAL_COMMUNITY): Payer: Self-pay

## 2024-02-04 ENCOUNTER — Other Ambulatory Visit: Payer: Self-pay

## 2024-02-05 ENCOUNTER — Other Ambulatory Visit: Payer: Self-pay

## 2024-02-05 ENCOUNTER — Other Ambulatory Visit (HOSPITAL_COMMUNITY): Payer: Self-pay

## 2024-03-14 ENCOUNTER — Ambulatory Visit: Payer: 59 | Admitting: Diagnostic Neuroimaging

## 2024-03-15 ENCOUNTER — Other Ambulatory Visit (HOSPITAL_COMMUNITY): Payer: Self-pay

## 2024-04-04 ENCOUNTER — Other Ambulatory Visit: Payer: Self-pay | Admitting: Diagnostic Neuroimaging

## 2024-04-04 ENCOUNTER — Other Ambulatory Visit (HOSPITAL_COMMUNITY): Payer: Self-pay

## 2024-04-07 ENCOUNTER — Other Ambulatory Visit (HOSPITAL_BASED_OUTPATIENT_CLINIC_OR_DEPARTMENT_OTHER): Payer: Self-pay

## 2024-04-07 MED ORDER — LEVETIRACETAM 750 MG PO TABS
1500.0000 mg | ORAL_TABLET | Freq: Two times a day (BID) | ORAL | 0 refills | Status: DC
  Filled 2024-04-07: qty 120, 30d supply, fill #0
  Filled 2024-05-30: qty 120, 30d supply, fill #1
  Filled 2024-07-13: qty 120, 30d supply, fill #2

## 2024-04-08 ENCOUNTER — Other Ambulatory Visit: Payer: Self-pay

## 2024-04-09 ENCOUNTER — Other Ambulatory Visit (HOSPITAL_COMMUNITY): Payer: Self-pay

## 2024-04-09 ENCOUNTER — Other Ambulatory Visit: Payer: Self-pay | Admitting: Diagnostic Neuroimaging

## 2024-04-11 ENCOUNTER — Other Ambulatory Visit: Payer: Self-pay

## 2024-04-11 ENCOUNTER — Other Ambulatory Visit (HOSPITAL_COMMUNITY): Payer: Self-pay

## 2024-04-11 MED ORDER — TEGRETOL-XR 200 MG PO TB12
600.0000 mg | ORAL_TABLET | Freq: Two times a day (BID) | ORAL | 0 refills | Status: DC
  Filled 2024-04-11: qty 540, 90d supply, fill #0

## 2024-05-30 ENCOUNTER — Other Ambulatory Visit: Payer: Self-pay

## 2024-05-30 ENCOUNTER — Other Ambulatory Visit (HOSPITAL_COMMUNITY): Payer: Self-pay

## 2024-06-01 ENCOUNTER — Other Ambulatory Visit: Payer: Self-pay

## 2024-07-08 ENCOUNTER — Other Ambulatory Visit (HOSPITAL_COMMUNITY): Payer: Self-pay

## 2024-07-13 ENCOUNTER — Other Ambulatory Visit (HOSPITAL_COMMUNITY): Payer: Self-pay

## 2024-08-02 ENCOUNTER — Observation Stay (HOSPITAL_COMMUNITY): Admission: EM | Admit: 2024-08-02 | Discharge: 2024-08-06 | Disposition: A

## 2024-08-02 ENCOUNTER — Other Ambulatory Visit: Payer: Self-pay

## 2024-08-02 ENCOUNTER — Emergency Department (HOSPITAL_COMMUNITY)

## 2024-08-02 ENCOUNTER — Encounter (HOSPITAL_COMMUNITY): Payer: Self-pay | Admitting: Emergency Medicine

## 2024-08-02 DIAGNOSIS — G809 Cerebral palsy, unspecified: Secondary | ICD-10-CM | POA: Diagnosis not present

## 2024-08-02 DIAGNOSIS — Z79899 Other long term (current) drug therapy: Secondary | ICD-10-CM | POA: Diagnosis not present

## 2024-08-02 DIAGNOSIS — R569 Unspecified convulsions: Secondary | ICD-10-CM | POA: Insufficient documentation

## 2024-08-02 DIAGNOSIS — I2694 Multiple subsegmental pulmonary emboli without acute cor pulmonale: Principal | ICD-10-CM

## 2024-08-02 DIAGNOSIS — I2699 Other pulmonary embolism without acute cor pulmonale: Secondary | ICD-10-CM | POA: Diagnosis not present

## 2024-08-02 DIAGNOSIS — Z789 Other specified health status: Secondary | ICD-10-CM

## 2024-08-02 DIAGNOSIS — R0602 Shortness of breath: Secondary | ICD-10-CM | POA: Diagnosis present

## 2024-08-02 LAB — CBC
HCT: 42.2 % (ref 39.0–52.0)
Hemoglobin: 14.3 g/dL (ref 13.0–17.0)
MCH: 33.1 pg (ref 26.0–34.0)
MCHC: 33.9 g/dL (ref 30.0–36.0)
MCV: 97.7 fL (ref 80.0–100.0)
Platelets: 196 K/uL (ref 150–400)
RBC: 4.32 MIL/uL (ref 4.22–5.81)
RDW: 12.5 % (ref 11.5–15.5)
WBC: 11.2 K/uL — ABNORMAL HIGH (ref 4.0–10.5)
nRBC: 0 % (ref 0.0–0.2)

## 2024-08-02 LAB — TROPONIN I (HIGH SENSITIVITY)
Troponin I (High Sensitivity): 6 ng/L (ref <18)
Troponin I (High Sensitivity): 5 ng/L (ref <18)

## 2024-08-02 LAB — BASIC METABOLIC PANEL WITH GFR
Anion gap: 6 (ref 5–15)
BUN: 15 mg/dL (ref 6–20)
CO2: 26 mmol/L (ref 22–32)
Calcium: 8.9 mg/dL (ref 8.9–10.3)
Chloride: 105 mmol/L (ref 98–111)
Creatinine, Ser: 1.14 mg/dL (ref 0.61–1.24)
GFR, Estimated: >60 mL/min (ref >60)
Glucose, Bld: 98 mg/dL (ref 70–99)
Potassium: 4.5 mmol/L (ref 3.5–5.1)
Sodium: 137 mmol/L (ref 135–145)

## 2024-08-02 LAB — D-DIMER, QUANTITATIVE: D-Dimer, Quant: 1.3 ug{FEU}/mL — ABNORMAL HIGH (ref 0.00–0.50)

## 2024-08-02 LAB — RESP PANEL BY RT-PCR (RSV, FLU A&B, COVID)  RVPGX2
Influenza A by PCR: NEGATIVE
Influenza B by PCR: NEGATIVE
Resp Syncytial Virus by PCR: NEGATIVE
SARS Coronavirus 2 by RT PCR: NEGATIVE

## 2024-08-02 LAB — BRAIN NATRIURETIC PEPTIDE: B Natriuretic Peptide: 16.2 pg/mL (ref 0.0–100.0)

## 2024-08-02 MED ORDER — ALBUTEROL SULFATE (2.5 MG/3ML) 0.083% IN NEBU
2.5000 mg | INHALATION_SOLUTION | Freq: Four times a day (QID) | RESPIRATORY_TRACT | Status: DC | PRN
  Administered 2024-08-02: 2.5 mg via RESPIRATORY_TRACT
  Filled 2024-08-02: qty 3

## 2024-08-02 MED ORDER — LEVETIRACETAM 500 MG PO TABS
1500.0000 mg | ORAL_TABLET | Freq: Two times a day (BID) | ORAL | Status: DC
  Administered 2024-08-02 – 2024-08-06 (×8): 1500 mg via ORAL
  Filled 2024-08-02 (×8): qty 3

## 2024-08-02 MED ORDER — HYDROMORPHONE HCL 1 MG/ML IJ SOLN
1.0000 mg | INTRAMUSCULAR | Status: DC | PRN
  Administered 2024-08-02 – 2024-08-05 (×11): 1 mg via INTRAVENOUS
  Filled 2024-08-02 (×11): qty 1

## 2024-08-02 MED ORDER — OXYCODONE HCL 5 MG PO TABS
5.0000 mg | ORAL_TABLET | Freq: Once | ORAL | Status: AC
  Administered 2024-08-02: 5 mg via ORAL
  Filled 2024-08-02: qty 1

## 2024-08-02 MED ORDER — MORPHINE SULFATE (PF) 4 MG/ML IV SOLN
4.0000 mg | Freq: Once | INTRAVENOUS | Status: AC
  Administered 2024-08-02: 4 mg via INTRAVENOUS
  Filled 2024-08-02: qty 1

## 2024-08-02 MED ORDER — OXYCODONE HCL 5 MG PO TABS
5.0000 mg | ORAL_TABLET | ORAL | Status: DC | PRN
  Administered 2024-08-02: 5 mg via ORAL
  Filled 2024-08-02: qty 1

## 2024-08-02 MED ORDER — DROPERIDOL 2.5 MG/ML IJ SOLN
1.2500 mg | Freq: Once | INTRAMUSCULAR | Status: AC
  Administered 2024-08-02: 1.25 mg via INTRAVENOUS
  Filled 2024-08-02: qty 2

## 2024-08-02 MED ORDER — IOHEXOL 350 MG/ML SOLN
75.0000 mL | Freq: Once | INTRAVENOUS | Status: AC | PRN
  Administered 2024-08-02: 75 mL via INTRAVENOUS

## 2024-08-02 MED ORDER — LIDOCAINE 5 % EX PTCH
1.0000 | MEDICATED_PATCH | CUTANEOUS | Status: DC
  Administered 2024-08-02: 1 via TRANSDERMAL
  Filled 2024-08-02: qty 1

## 2024-08-02 MED ORDER — CYCLOBENZAPRINE HCL 5 MG PO TABS
5.0000 mg | ORAL_TABLET | Freq: Three times a day (TID) | ORAL | Status: DC | PRN
  Administered 2024-08-03 – 2024-08-05 (×6): 5 mg via ORAL
  Filled 2024-08-02 (×6): qty 1

## 2024-08-02 MED ORDER — CARBAMAZEPINE ER 200 MG PO TB12
600.0000 mg | ORAL_TABLET | Freq: Two times a day (BID) | ORAL | Status: DC
  Administered 2024-08-02 – 2024-08-06 (×8): 600 mg via ORAL
  Filled 2024-08-02 (×9): qty 3

## 2024-08-02 MED ORDER — ENOXAPARIN SODIUM 100 MG/ML IJ SOSY
90.0000 mg | PREFILLED_SYRINGE | Freq: Two times a day (BID) | INTRAMUSCULAR | Status: DC
  Administered 2024-08-02 – 2024-08-03 (×2): 90 mg via SUBCUTANEOUS
  Filled 2024-08-02 (×4): qty 0.9

## 2024-08-02 NOTE — Plan of Care (Addendum)
 FMTS Interim Progress Note  S: Seen at bedside with wife and daughter.  Patient is try to get comfortable in bed due to right sided chest pain and shortness of breath.  Wife feels like he has been having more short of breath since she has been here this evening.  O: BP 130/72   Pulse 75   Temp 99.5 F (37.5 C) (Oral)   Resp (!) 22   Ht 6' 3 (1.905 m)   Wt 83.9 kg   SpO2 98%   BMI 23.12 kg/m   General: Alert and oriented, sitting up in bed, uncomfortable appearing HEENT: NCAT, EOM grossly normal, midline nasal septum Cardiac: RRR, no m/r/g appreciated Respiratory: CTAB, breathing and speaking comfortably on RA, clutching around right side of chest due to pain Extremities: Moves all extremities grossly equally Neurological: No gross focal deficit  A/P: Acute pulmonary embolism Continue continuous cardiac monitoring.  Continue pain control with oxycodone  and hydromorphone .  Unfortunately patient has had adverse reaction to Tylenol  in the past, so for other multimodal pain management I will add on Flexeril  and a lidocaine  patch for his chest.  Reassuringly vitals remained stable and my exam is reassuring.  Can administer 2 L O2 for comfort. Continue anticoagulation.  We will hold off on coagulopathy labs given acute thrombosis and recent administration of anticoagulants would likely make these inaccurate at this time.  Tharon Lung, MD 08/02/2024, 8:29 PM PGY-3, [facility] pager [phone]

## 2024-08-02 NOTE — ED Provider Notes (Signed)
 [place] EMERGENCY DEPARTMENT AT [facility] Provider Note  CSN: [id number] Arrival date & time: 08/02/24 1017  Chief Complaint(s) Shortness of Breath  HPI Ricky Humphrey is a 33 y.o. male who is here today for shortness of breath, right sided pain that started last night.  Patient had a pulmonary embolism in February, states this feels similar.  Patient stated that he only had a few weeks of anticoagulation, was admitted in [place].  He has not been taking anticoagulation since.   Past Medical History Past Medical History:  Diagnosis Date   Arachnoid cyst    s/p drainage by craniotomy   Cerebral palsy (HCC)    mild   Epilepsy (HCC)    Headache    Paroxysmal atrial flutter (HCC)    brief, post sz on 09/22/11   Seizure (HCC)    Seizures (HCC)    last June 2020   Patient Active Problem List   Diagnosis Date Noted   Pulmonary embolism (HCC) 08/18/2022   Acute pulmonary embolism (HCC) 08/16/2022   Abnormal x-ray of knee 08/16/2022   Right knee pain 08/16/2022   Breakthrough seizure (HCC) 08/06/2022   Marijuana abuse 08/06/2022   Traumatic rhabdomyolysis, initial encounter 08/06/2022   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 Authorizing Provider  albuterol  (PROVENTIL ) (5 MG/ML) 0.5% nebulizer solution Take 0.5 mLs (2.5 mg total) by nebulization every 6 (six) hours as needed for wheezing or shortness of breath. 08/15/22   Silver Wonda DELENA, PA  levETIRAcetam  (KEPPRA ) 750 MG tablet Take 2 tablets (1,500 mg total) by mouth 2 (two) times daily. 04/07/24 07/01/25  Penumalli, Vikram R, MD  TEGRETOL -XR 200 MG 12 hr tablet  Take 3 tablets (600 mg total) by mouth 2 (two) times daily. 04/11/24 07/05/25  Margaret Eduard SAUNDERS, MD                                                                                                                                    Past Surgical History Past Surgical History:  Procedure Laterality Date   acrnoid cyst     HAND SURGERY  07/2019   fracture   TONSILLECTOMY AND ADENOIDECTOMY     Family History Family History  Problem Relation Age of Onset   Migraines Mother    Diabetes Mother    Asthma Brother     Social History Social History   Tobacco Use   Smoking status: Never   Smokeless tobacco: Never  Vaping Use   Vaping status: Never Used  Substance Use Topics   Alcohol use: No    Alcohol/week: 0.0 standard drinks of alcohol  Drug use: No   Allergies Acetaminophen  and Ibuprofen   Review of Systems Review of Systems  Physical Exam Vital Signs  I have reviewed the triage vital signs BP (!) 138/90 (BP Location: Right Arm)   Pulse 76   Temp 98.4 F (36.9 C) (Oral)   Resp (!) 22   Ht 6' 3 (1.905 m)   Wt 83.9 kg   SpO2 100%   BMI 23.12 kg/m   Physical Exam Vitals and nursing note reviewed.  Cardiovascular:     Rate and Rhythm: Normal rate.  Pulmonary:     Effort: Pulmonary effort is normal.     Breath sounds: No decreased breath sounds, wheezing, rhonchi or rales.  Chest:     Chest wall: No mass.  Abdominal:     Palpations: Abdomen is soft.  Neurological:     General: No focal deficit present.     Mental Status: He is alert.     ED Results and Treatments Labs (all labs ordered are listed, but only abnormal results are displayed) Labs Reviewed  CBC - Abnormal; Notable for the following components:      Result Value   WBC 11.2 (*)    All other components within normal limits  D-DIMER, QUANTITATIVE - Abnormal; Notable for the following components:   D-Dimer, Quant 1.30 (*)    All other components within normal limits  RESP PANEL BY RT-PCR  (RSV, FLU A&B, COVID)  RVPGX2  BASIC METABOLIC PANEL WITH GFR  BRAIN NATRIURETIC PEPTIDE  TROPONIN I (HIGH SENSITIVITY)  TROPONIN I (HIGH SENSITIVITY)                                                                                                                          Radiology   Pertinent labs & imaging results that were available during my care of the patient were reviewed by me and considered in my medical decision making (see MDM for details).  Medications Ordered in ED Medications  morphine  (PF) 4 MG/ML injection 4 mg (4 mg Intravenous Given 08/02/24 1127)  droperidol  (INAPSINE ) 2.5 MG/ML injection 1.25 mg (1.25 mg Intravenous Given 08/02/24 1223)  iohexol  (OMNIPAQUE ) 350 MG/ML injection 75 mL (75 mLs Intravenous Contrast Given 08/02/24 1249)  Procedures .Critical Care  Performed by: Mannie Fairy DASEN, DO Authorized by: Mannie Fairy DASEN, DO   Critical care provider statement:    Critical care time (minutes):  33   Critical care was necessary to treat or prevent imminent or life-threatening deterioration of the following conditions:  Respiratory failure and circulatory failure   Critical care was time spent personally by me on the following activities:  Development of treatment plan with patient or surrogate, discussions with consultants, evaluation of patient's response to treatment, examination of patient, ordering and review of laboratory studies, ordering and review of radiographic studies, ordering and performing treatments and interventions, pulse oximetry, re-evaluation of patient's condition and review of old charts   (including critical care time)  Medical Decision Making / ED Course   This patient presents to the ED for concern of shortness of breath and chest pain, this involves an extensive number of treatment options, and is a  complaint that carries with it a high risk of complications and morbidity.  The differential diagnosis includes pulmonary embolus, pneumonia, consider ACS, less likely dissection.  MDM: With history, do of concern for PE.  Ordered D-dimer on the patient which was elevated.  No tachycardia, he is tachypneic, no evidence of right heart strain on EKG.  BNP and troponin normal.  Patient CTA does show extensive pulmonary emboli.  Given the extent to this, patient will require admission despite absence of heart strain.  The clot burden is significant enough that patient would be at higher risk of deterioration if he were to go home on oral anticoagulation.   Additional history obtained:  -External records from outside source obtained and reviewed including: Chart review including previous notes, labs, imaging, consultation notes   Lab Tests: -I ordered, reviewed, and interpreted labs.   The pertinent results include:   Labs Reviewed  CBC - Abnormal; Notable for the following components:      Result Value   WBC 11.2 (*)    All other components within normal limits  D-DIMER, QUANTITATIVE - Abnormal; Notable for the following components:   D-Dimer, Quant 1.30 (*)    All other components within normal limits  RESP PANEL BY RT-PCR (RSV, FLU A&B, COVID)  RVPGX2  BASIC METABOLIC PANEL WITH GFR  BRAIN NATRIURETIC PEPTIDE  TROPONIN I (HIGH SENSITIVITY)  TROPONIN I (HIGH SENSITIVITY)      EKG   EKG Interpretation Date/Time:  Tuesday August 02 2024 10:53:10 EST Ventricular Rate:  72 PR Interval:  170 QRS Duration:  88 QT Interval:  340 QTC Calculation: 372 R Axis:   88  Text Interpretation: Normal sinus rhythm Normal ECG When compared with ECG of 10-May-2023 11:45, PREVIOUS ECG IS PRESENT similar to prior Confirmed by Elnor Savant ([id number]) on 08/02/2024 10:59:29 AM         Imaging Studies ordered: I ordered imaging studies including chest x-ray, CTA chest, CT abdomen pelvis I  independently visualized and interpreted imaging. I agree with the radiologist interpretation   Medicines ordered and prescription drug management: Meds ordered this encounter  Medications   morphine  (PF) 4 MG/ML injection 4 mg   droperidol  (INAPSINE ) 2.5 MG/ML injection 1.25 mg   iohexol  (OMNIPAQUE ) 350 MG/ML injection 75 mL    -I have reviewed the patients home medicines and have made adjustments as needed  Critical interventions Large pulmonary emboli  Cardiac Monitoring: The patient was maintained on a cardiac monitor.  I personally viewed and interpreted the cardiac monitored which showed an underlying rhythm of: Normal  sinus rhythm    Reevaluation: After the interventions noted above, I reevaluated the patient and found that they have :improved  Co morbidities that complicate the patient evaluation  Past Medical History:  Diagnosis Date   Arachnoid cyst    s/p drainage by craniotomy   Cerebral palsy (HCC)    mild   Epilepsy (HCC)    Headache    Paroxysmal atrial flutter (HCC)    brief, post sz on 09/22/11   Seizure (HCC)    Seizures (HCC)    last June 2020        Final Clinical Impression(s) / ED Diagnoses Final diagnoses:  Multiple subsegmental pulmonary emboli without acute cor pulmonale (HCC)     @PCDICTATION @    Mannie Pac T, DO 08/02/24 1320

## 2024-08-02 NOTE — Assessment & Plan Note (Addendum)
 Acute, unprovoked, with history of reportedly provoked PE (questionable given patient had knee injury with some swelling but otherwise unremarkable Wells score). Tachypnea mildly improved with analgesia. Previously referred to hematology but never established care. Repeat unprovoked VTE <45, will pursue hypercoagulable work up. - CTA PE found extensive acute PE of right lung involving middle and lower lobe/segmental arteries anterior segmental artery - Lovenox  90 mg twice daily, will discuss with pharmacy given complex medication interaction. - Pain: Oxycodone  5 mg every 4 hours as needed moderate pain, Dilaudid  1 mg IV every 4 hours as needed severe pain.  Reportedly allergic to acetaminophen  - AM CBC, factor V, antithrombin, protein c, protein s, antiphospholipid antibody, ANA, fractionated electrophoresis

## 2024-08-02 NOTE — ED Triage Notes (Signed)
 PT presents for pain and SOB on the right side since last night. Pt has hx of PE on same side in February of this year.  Pt is not on blood thinners at this time.

## 2024-08-02 NOTE — ED Notes (Signed)
 CCMD notified to monitor patient

## 2024-08-02 NOTE — Hospital Course (Addendum)
 Ricky Humphrey is a 33 y.o.male with a history of seizures and previous PE 2023 who was admitted to the family medicine teaching Service at [facility] for acute PE. His hospital course is detailed below:  Acute pulmonary embolisms Presented to ED with sudden onset pleuritic chest pain with SOB that started last night.  Initial workup found elevated D-dimer and CTA PE found multiple right middle/lower lobe segmental arteries. Determined to be unprovoked. Patient was started on Lovenox  for VTE treatment given complex medication interaction from carbamazepine . Multimodal pain management during the course of hospitalization. With previous history of PE in mother and with personal history of 2 PEs, will need close outpatient hematology follow-up for hypercoagulability evaluation and potentially lifelong anticoagulation. Patient discharged on lovenox  80 mg SQ BID.   Other chronic conditions were medically managed with home medications and formulary alternatives as necessary (epilepsy)  PCP Follow-up Recommendations: Ensure Heme/Onc referral for unprovoked PE.  Ensure pt is consistently taking lovenox  Ensure neurology follow-up for adjusting antiepileptics given complex medication interaction. Repeat UA outpatient for microcytic hematuria Outpatient podiatry referral for severe pes planus and plantar ulcer.

## 2024-08-02 NOTE — Assessment & Plan Note (Signed)
 Epilepsy: Continue Keppra  1500 mg twice daily and carbamazepine  XR 600 mg twice daily

## 2024-08-02 NOTE — ED Notes (Signed)
 Patient transported to CT

## 2024-08-02 NOTE — H&P (Addendum)
 Hospital Admission History and Physical Service Pager: [phone]  Patient name: [name] Medical record number: [id number] Date of Birth: [birth year] Age: 33 y.o. Gender: male  Primary Care Provider: Joesph Cedar, NP [facility] Consultants: None Code Status:  Full Preferred Emergency Contact:   Contact Information     Name Relation Home Work Mobile   [place] Spouse   [phone]   Hayes,Marcella Mother [phone]  [phone]      Other Contacts   None on File      Chief Complaint: SOB, pleuritic chest pain  Differential and Medical Decision Making:  Ricky Humphrey is a 33 y.o. male presenting with past medical history of seizure disorder, cerebral palsy, PE 07/2022, history of atrial flutter and presenting with worsening shortness of breath and pleuritic chest pain.  Differential for this patient's presentation of this includes: Acute pulmonary embolism, elevated D-dimer and CTA finding multiple clots, pleuritic chest pain, shortness of breath with tachypnea. ACS, acute onset chest pain, less likely given no viral prodrome, right sided chest pain, no radiation or diaphoresis, troponins and BNP WNL Pneumothorax, acute onset right sided pleuritic pain with shortness of breath, less likely given CXR unremarkable, no SpO2 needed.  Assessment & Plan Pulmonary embolism (HCC) Acute, unprovoked, with history of reportedly provoked PE (questionable given patient had knee injury with some swelling but otherwise unremarkable Wells score). Tachypnea mildly improved with analgesia. Previously referred to hematology but never established care. Repeat unprovoked VTE <45, will pursue hypercoagulable work up. - CTA PE found extensive acute PE of right lung involving middle and lower lobe/segmental arteries anterior segmental artery - Lovenox  90 mg twice daily, will discuss with pharmacy given complex medication interaction. - Pain: Oxycodone  5 mg every 4  hours as needed moderate pain, Dilaudid  1 mg IV every 4 hours as needed severe pain.  Reportedly allergic to acetaminophen  - AM CBC, factor V, antithrombin, protein c, protein s, antiphospholipid antibody, ANA, fractionated electrophoresis Chronic health problem Epilepsy: Continue Keppra  1500 mg twice daily and carbamazepine  XR 600 mg twice daily   FEN/GI: Regular diet VTE Prophylaxis: Lovenox   Disposition: Home  History of Present Illness:  Ricky Humphrey is a 33 y.o. male with PMHx of questionably provoked PE 07/2022, presenting with acute shortness of breath and pleuritic right-sided chest pain starting last night.  Reports pain feels similar to previous PE 07/2022.  Denies recent surgery, immobilization, prolonged travel, sickness.  Denies fevers, chills, nausea, vomiting, abdominal pain, diaphoresis, leg swelling or leg pain.    States that his mother side of the family has problems with blood clots, but was unsure what the diagnosis was.  Last had a seizure early this year, has been compliant with medications.  In the ED, presented with severe pleuritic chest pain, splinting, and tachypnea, was given 4 mg morphine  IV, initial labs remarkable for D-dimer 1.30, troponins 6 and 5, was then sent for CTA PE which found multiple right-sided PEs of middle and lower lobe lobar/subsegmental a with no CT evidence of right heart strain.  Pertinent Past Medical History: Epilepsy Previous PE 07/2022 Cerebral palsy Remainder reviewed in history tab.   Pertinent Past Surgical History: Hand surgery 07/2019  Remainder reviewed in history tab.  Pertinent Social History: Tobacco use: No  alcohol use: No Other Substance use: Cannabis, daily  Pertinent Family History: Mother and multiple maternal relatives with clotting disorders, could not clarify which type or other related diagnoses .   Important Outpatient Medications: Keppra  1500 mg twice  daily Carbamazepine  XR 600 mg twice  daily  Objective: BP (!) 147/90   Pulse 72   Temp 98.4 F (36.9 C) (Oral)   Resp 20   Ht 6' 3 (1.905 m)   Wt 83.9 kg   SpO2 92%   BMI 23.12 kg/m  Exam: General: Ill-appearing, splinting right side of chest Cardiovascular: Regular rate rhythm, normal S1/S2 no murmurs rubs or gallops, 2+ pulses bilaterally Respiratory: Diminished air exchange, clear to auscultation bilaterally, no wheezes or crackles, no work of breathing Gastrointestinal: Active bowel sounds, soft, nondistended, nontender, no guarding Skin: Warm and dry, well-perfused Extremities: Bilateral lower extremities nonedematous, nontender, nonerythematous   Labs:  CBC BMET  Recent Labs  Lab 08/02/24 1041  WBC 11.2*  HGB 14.3  HCT 42.2  PLT 196   Recent Labs  Lab 08/02/24 1041  NA 137  K 4.5  CL 105  CO2 26  BUN 15  CREATININE 1.14  GLUCOSE 98  CALCIUM  8.9      CXR IMPRESSION: 1. No acute cardiopulmonary process.  CTA PE IMPRESSION: 1. Extensive acute pulmonary emboli within the right lung involving right middle and right lower lobe lobar/segmental arteries and the anterior segmental artery of the right upper lobe; no emboli identified in the left lung 2. No CT evidence of right heart strain; main pulmonary artery caliber within normal limits 3. Dependent bilateral lower lobe ground-glass and reticular opacities, likely atelectasis; nondependent lungs are clear  CT abdomen pelvis with contrast IMPRESSION: 1. No acute findings in the abdomen or pelvis.  Lorrane Pac, MD 08/02/2024, 1:40 PM PGY-1, [facility] Family Medicine  FPTS Intern pager: [phone], text pages welcome Secure chat group [facility] Teaching Service

## 2024-08-03 ENCOUNTER — Other Ambulatory Visit (HOSPITAL_COMMUNITY): Payer: Self-pay

## 2024-08-03 ENCOUNTER — Inpatient Hospital Stay (HOSPITAL_COMMUNITY)

## 2024-08-03 ENCOUNTER — Telehealth (HOSPITAL_COMMUNITY): Payer: Self-pay | Admitting: Pharmacy Technician

## 2024-08-03 DIAGNOSIS — R569 Unspecified convulsions: Secondary | ICD-10-CM

## 2024-08-03 DIAGNOSIS — R1031 Right lower quadrant pain: Secondary | ICD-10-CM

## 2024-08-03 DIAGNOSIS — R3129 Other microscopic hematuria: Secondary | ICD-10-CM

## 2024-08-03 LAB — URINALYSIS, ROUTINE W REFLEX MICROSCOPIC
Bacteria, UA: NONE SEEN
Bilirubin Urine: NEGATIVE
Glucose, UA: NEGATIVE mg/dL
Ketones, ur: NEGATIVE mg/dL
Leukocytes,Ua: NEGATIVE
Nitrite: NEGATIVE
Protein, ur: 30 mg/dL — AB
Specific Gravity, Urine: 1.036 — ABNORMAL HIGH (ref 1.005–1.030)
pH: 5 (ref 5.0–8.0)

## 2024-08-03 LAB — CBC
HCT: 41.3 % (ref 39.0–52.0)
Hemoglobin: 13.9 g/dL (ref 13.0–17.0)
MCH: 32.5 pg (ref 26.0–34.0)
MCHC: 33.7 g/dL (ref 30.0–36.0)
MCV: 96.5 fL (ref 80.0–100.0)
Platelets: 191 K/uL (ref 150–400)
RBC: 4.28 MIL/uL (ref 4.22–5.81)
RDW: 12.6 % (ref 11.5–15.5)
WBC: 14.8 K/uL — ABNORMAL HIGH (ref 4.0–10.5)
nRBC: 0 % (ref 0.0–0.2)

## 2024-08-03 LAB — COMPREHENSIVE METABOLIC PANEL WITH GFR
ALT: 23 U/L (ref 0–44)
AST: 22 U/L (ref 15–41)
Albumin: 3.2 g/dL — ABNORMAL LOW (ref 3.5–5.0)
Alkaline Phosphatase: 53 U/L (ref 38–126)
Anion gap: 6 (ref 5–15)
BUN: 10 mg/dL (ref 6–20)
CO2: 26 mmol/L (ref 22–32)
Calcium: 8.8 mg/dL — ABNORMAL LOW (ref 8.9–10.3)
Chloride: 104 mmol/L (ref 98–111)
Creatinine, Ser: 1.2 mg/dL (ref 0.61–1.24)
GFR, Estimated: >60 mL/min (ref >60)
Glucose, Bld: 100 mg/dL — ABNORMAL HIGH (ref 70–99)
Potassium: 4.2 mmol/L (ref 3.5–5.1)
Sodium: 136 mmol/L (ref 135–145)
Total Bilirubin: 1.1 mg/dL (ref 0.0–1.2)
Total Protein: 6.4 g/dL — ABNORMAL LOW (ref 6.5–8.1)

## 2024-08-03 LAB — HIV ANTIBODY (ROUTINE TESTING W REFLEX): HIV Screen 4th Generation wRfx: NONREACTIVE

## 2024-08-03 MED ORDER — SODIUM CHLORIDE 0.9 % IV SOLN: INTRAVENOUS | Status: AC

## 2024-08-03 MED ORDER — LIDOCAINE 5 % EX PTCH
2.0000 | MEDICATED_PATCH | CUTANEOUS | Status: DC
  Administered 2024-08-03 – 2024-08-05 (×3): 2 via TRANSDERMAL
  Filled 2024-08-03 (×3): qty 2

## 2024-08-03 MED ORDER — POLYETHYLENE GLYCOL 3350 17 G PO PACK
17.0000 g | PACK | Freq: Every day | ORAL | Status: DC
  Administered 2024-08-03 – 2024-08-04 (×2): 17 g via ORAL
  Filled 2024-08-03 (×2): qty 1

## 2024-08-03 MED ORDER — IOHEXOL 350 MG/ML SOLN
75.0000 mL | Freq: Once | INTRAVENOUS | Status: AC | PRN
  Administered 2024-08-03: 75 mL via INTRAVENOUS

## 2024-08-03 MED ORDER — OXYCODONE HCL 5 MG PO TABS
10.0000 mg | ORAL_TABLET | Freq: Four times a day (QID) | ORAL | Status: DC
  Administered 2024-08-03 – 2024-08-06 (×13): 10 mg via ORAL
  Filled 2024-08-03 (×13): qty 2

## 2024-08-03 MED ORDER — HYDROMORPHONE HCL 1 MG/ML IJ SOLN
1.0000 mg | Freq: Once | INTRAMUSCULAR | Status: AC
  Administered 2024-08-03: 1 mg via INTRAVENOUS
  Filled 2024-08-03: qty 1

## 2024-08-03 MED ORDER — SENNA 8.6 MG PO TABS
1.0000 | ORAL_TABLET | Freq: Every day | ORAL | Status: DC
  Administered 2024-08-03 – 2024-08-05 (×3): 8.6 mg via ORAL
  Filled 2024-08-03 (×3): qty 1

## 2024-08-03 MED ORDER — ENOXAPARIN SODIUM 80 MG/0.8ML IJ SOSY
80.0000 mg | PREFILLED_SYRINGE | Freq: Two times a day (BID) | INTRAMUSCULAR | Status: DC
  Administered 2024-08-03 – 2024-08-05 (×6): 80 mg via SUBCUTANEOUS
  Filled 2024-08-03 (×7): qty 0.8

## 2024-08-03 MED ORDER — OXYCODONE HCL 5 MG PO TABS
7.5000 mg | ORAL_TABLET | ORAL | Status: DC | PRN
  Administered 2024-08-03: 7.5 mg via ORAL
  Filled 2024-08-03: qty 2

## 2024-08-03 MED ORDER — ACETAMINOPHEN 160 MG/5ML PO SOLN
1000.0000 mg | Freq: Four times a day (QID) | ORAL | Status: DC
  Administered 2024-08-03 – 2024-08-04 (×4): 1000 mg via ORAL
  Filled 2024-08-03 (×6): qty 40.6

## 2024-08-03 NOTE — Assessment & Plan Note (Signed)
 Acute onset worsening last night.  No previous surgical history, worsening leukocytosis from yesterday, not tolerating diet, be concerning for appendicitis.  Could also be referred pain from resolving PE.  CTAP yesterday found no acute findings, patient reports symptoms of significantly worsened since then. - CTAP with contrast pending, can consider GEN surge consult if change in imaging - Continue close clinical monitoring - AM CBC

## 2024-08-03 NOTE — Telephone Encounter (Signed)
 Patient Product/process Development Scientist completed.    The patient is insured through [facility] [place] Medicaid.     Ran test claim for enoxaparin  80 mg/0.8 ml and the current 30 day co-pay is $0.00.  Ran test claim for enoxaparin  120 mg/0.8 ml and the current 30 day co-pay is $0.00.  This test claim was processed through [facility]- copay amounts may vary at other pharmacies due to pharmacy/plan contracts, or as the patient moves through the different stages of their insurance plan.     Reyes Sharps, CPHT Pharmacy Technician Patient Advocate Specialist Lead [facility] Patient Advocate Team Direct Number: [phone]  Fax: [phone]

## 2024-08-03 NOTE — Plan of Care (Signed)
 FMTS Interim Progress Note  S: Seen at bedside for nighttime rounds.  Patient is comfortable.  He feels the pain slowly returning in his right upper chest and is wondering when his next dose of pain medication is.  His aunt and mother were on the line and shared a family history of PE and his sister as well as a history of difficult clotting and multiple other family members.  O: BP 118/84 (BP Location: Right Arm)   Pulse 77   Temp 99.3 F (37.4 C) (Oral)   Resp 19   Ht 6' 3 (1.905 m)   Wt 83.9 kg   SpO2 100%   BMI 23.12 kg/m   General: Alert, no acute distress Respiratory: Breathing speaking comfortably on room air, using incentive spirometry throughout exam Abdominal: Nondistended Neurologic: No gross focal deficit  A/P: Pulmonary embolism Right chest/right lower quadrant abdominal pain Pain overall controlled with current regimen.  CTAP earlier today given increased right lower quadrant pain without focal findings.  Mildly elevated white count and slight elevation in temperature are likely secondary to PE and associated atelectasis.  Continue incentive spirometry.  Can consider right upper quadrant ultrasound if pain worsens overnight to further evaluate for gallbladder pathology.  Of note, long-term, patient will likely need to be on anticoagulation, though this will complicate testing for an inherited coagulopathy based on his extensive family history.  However, he will need to follow-up with hematology outpatient regardless.  Tharon Lung, MD 08/03/2024, 9:01 PM PGY-3, [facility] Family Medicine Service pager [phone]

## 2024-08-03 NOTE — Progress Notes (Signed)
° °  Brief Progress Note   _____________________________________________________________________________________________________________  Patient Name: Ricky Humphrey Patient DOB: August 03, 1991 Date: @TODAY @      Data: Reviewed vital signs, labs, and notes.    Action: No action required at this time. Currently no tele beds available.    Response:    _____________________________________________________________________________________________________________  [facility] RN Expeditor Hanson Medeiros S Riyad Keena Please contact us  directly via secure chat (search for [facility]) or by calling us  at [phone] [facility]).

## 2024-08-03 NOTE — Progress Notes (Signed)
 Daily Progress Note Intern Pager: [phone]  Patient name: Ricky Humphrey Medical record number: [id number] Date of birth: [birth year] Age: 33 y.o. Gender: male  Primary Care Provider: Emilio Joesph DEL, PA-C Consultants: None Code Status: Full code  Pt Overview and Major Events to Date:  12/9-admitted   Medical Decision Making:  Ricky Humphrey is a 33 year old with PMHx of epilepsy on Keppra  and carbamazepine  XR, and previous PE 2023 admitted for multiple acute pulmonary emboli.  Worse pain this morning, new RLQ abdominal pain concerning for appendicitis pending workup. Assessment & Plan Pulmonary embolism (HCC) Poor pain control, minor improvement with new regimen started overnight.  Still intermittently tachypneic, otherwise VSS.  Has been getting as needed Dilaudid  every 4 hours. - Lovenox  90 mg twice daily, will discuss with pharmacy given complex medication interaction. - Pain:  Lidocaine  patch every 12 hours will increase to two patches  - continue cyclobenzaprine  5 mg 3 times daily as needed - oxycodone  5 mg every 4 hours as needed moderate pain - Dilaudid  1 mg IV every 4 hours as needed severe pain - Trial acetaminophen  1000 mg, reportedly allergic but has received on previous hospital admissions. - Urinalysis with 30 protein, pending follow-up microalbumin/creatinine ratio - AM CBC Microscopic hematuria Found to have incidental hemoglobin on UA, moderate hemoglobin and 6-10 RBCs.  Likely incidental, in the context of starting Lovenox  with no urinary symptoms. - Repeat UA outpatient Right lower quadrant abdominal pain Acute onset worsening last night.  No previous surgical history, worsening leukocytosis from yesterday, not tolerating diet, be concerning for appendicitis.  Could also be referred pain from resolving PE.  CTAP yesterday found no acute findings, patient reports symptoms of significantly worsened since then. - CTAP with contrast pending, can  consider GEN surge consult if change in imaging - Continue close clinical monitoring - AM CBC Chronic health problem Epilepsy: Continue Keppra  1500 mg twice daily and carbamazepine  XR 600 mg twice daily  FEN/GI: Regular diet PPx: Lovenox  Dispo:Home pending clinical improvement . Barriers include pain.   Subjective:  Saw patient at bedside, reports that his pain has been very severe, analgesia very effective 2 out of 10 pain with Dilaudid  but only lasting about 30 minutes before it wears off.  Was in 10 out of 10 pain when I saw him, reported the pain was primarily right lower quadrant, though also endorses pain is spreading on his right anterior chest to back.  Has not been able to tolerate diet this morning.  Reports he has not had a bowel movement in the last 2 days.  Also endorses labored breathing, though has been able to move more air with analgesia.  Objective: Temp:  [98.4 F (36.9 C)-100.2 F (37.9 C)] 100.2 F (37.9 C) (12/10 0247) Pulse Rate:  [65-94] 94 (12/10 0710) Resp:  [15-35] 22 (12/10 0710) BP: (121-147)/(70-90) 145/81 (12/10 0710) SpO2:  [92 %-100 %] 97 % (12/10 0710) Weight:  [83.9 kg] 83.9 kg (12/09 1129) Physical Exam: General: Well-appearing, in moderate distress Cardiovascular: Regular rate rhythm, no murmurs rubs or gallops, normal S1/S2, cap refill < 2 secs Respiratory: Clear to auscultation bilaterally, better air exchange from previous exam, no wheezes, crackles, rhonchi Abdomen: Hypoactive bowel sounds, exquisitely tender McBurney's point, + Rovsing sign, + guarding, no rebound tenderness Extremities: Bilateral lower extremities nonedematous, nontender  Laboratory: Most recent CBC Lab Results  Component Value Date   WBC 14.8 (H) 08/03/2024   HGB 13.9 08/03/2024   HCT 41.3 08/03/2024  MCV 96.5 08/03/2024   PLT 191 08/03/2024   Most recent BMP    Latest Ref Rng & Units 08/03/2024    1:15 AM  BMP  Glucose 70 - 99 mg/dL 899   BUN 6 - 20 mg/dL 10    Creatinine 9.38 - 1.24 mg/dL 8.79   Sodium 864 - 854 mmol/L 136   Potassium 3.5 - 5.1 mmol/L 4.2   Chloride 98 - 111 mmol/L 104   CO2 22 - 32 mmol/L 26   Calcium  8.9 - 10.3 mg/dL 8.8     Other pertinent labs urinalysis moderate hemoglobin, and the the 30 protein, 6-10 RBCs  Imaging/Diagnostic Tests: CTAP with contrast pending  Lorrane Pac, MD 08/03/2024, 7:16 AM  PGY-1, [facility] FPTS Intern pager: [phone], text pages welcome Secure chat group [facility] Teaching Service

## 2024-08-03 NOTE — Assessment & Plan Note (Addendum)
 Found to have incidental hemoglobin on UA, moderate hemoglobin and 6-10 RBCs.  Likely incidental, in the context of starting Lovenox  with no urinary symptoms. - Repeat UA outpatient

## 2024-08-03 NOTE — Plan of Care (Signed)

## 2024-08-03 NOTE — Assessment & Plan Note (Signed)
 Epilepsy: Continue Keppra  1500 mg twice daily and carbamazepine  XR 600 mg twice daily

## 2024-08-03 NOTE — Progress Notes (Signed)
 Patient arrived to unit via stretcherfrom ED.  Alert and oriented x4.  Current pain level 8/10 to right lung fields radiating to back.  Skin intact except for posterior left foot.  Patient advises of pain when ambulating.  Per pt, he is unable to put weight on the bottom of his foot.  Walks on the side of his foot.  Advises of stepping in glass approximately 1 year ago.  Photo taken by Charge Nurse, Artist, RN.  Dr. Elicia notified.  Clarified with Dr. Zheng that no telemetry was needed at this time.  Incentive spirometer ordered.  This RN educated patient.  Patient able to demonstrate back to this RN.

## 2024-08-03 NOTE — Assessment & Plan Note (Addendum)
 Poor pain control, minor improvement with new regimen started overnight.  Still intermittently tachypneic, otherwise VSS.  Has been getting as needed Dilaudid  every 4 hours. - Lovenox  90 mg twice daily, will discuss with pharmacy given complex medication interaction. - Pain:  Lidocaine  patch every 12 hours will increase to two patches  - continue cyclobenzaprine  5 mg 3 times daily as needed - oxycodone  5 mg every 4 hours as needed moderate pain - Dilaudid  1 mg IV every 4 hours as needed severe pain - Trial acetaminophen  1000 mg, reportedly allergic but has received on previous hospital admissions. - Urinalysis with 30 protein, pending follow-up microalbumin/creatinine ratio - AM CBC

## 2024-08-04 LAB — BASIC METABOLIC PANEL WITH GFR
Anion gap: 6 (ref 5–15)
BUN: 11 mg/dL (ref 6–20)
CO2: 25 mmol/L (ref 22–32)
Calcium: 8.3 mg/dL — ABNORMAL LOW (ref 8.9–10.3)
Chloride: 102 mmol/L (ref 98–111)
Creatinine, Ser: 0.87 mg/dL (ref 0.61–1.24)
GFR, Estimated: >60 mL/min (ref >60)
Glucose, Bld: 89 mg/dL (ref 70–99)
Potassium: 4 mmol/L (ref 3.5–5.1)
Sodium: 133 mmol/L — ABNORMAL LOW (ref 135–145)

## 2024-08-04 LAB — CBC
HCT: 40 % (ref 39.0–52.0)
Hemoglobin: 13.7 g/dL (ref 13.0–17.0)
MCH: 33.3 pg (ref 26.0–34.0)
MCHC: 34.3 g/dL (ref 30.0–36.0)
MCV: 97.3 fL (ref 80.0–100.0)
Platelets: 181 K/uL (ref 150–400)
RBC: 4.11 MIL/uL — ABNORMAL LOW (ref 4.22–5.81)
RDW: 12.5 % (ref 11.5–15.5)
WBC: 12.9 K/uL — ABNORMAL HIGH (ref 4.0–10.5)
nRBC: 0 % (ref 0.0–0.2)

## 2024-08-04 MED ORDER — POLYETHYLENE GLYCOL 3350 17 G PO PACK
17.0000 g | PACK | Freq: Two times a day (BID) | ORAL | Status: DC
  Administered 2024-08-04 – 2024-08-06 (×2): 17 g via ORAL
  Filled 2024-08-04 (×4): qty 1

## 2024-08-04 MED ORDER — ACETAMINOPHEN 500 MG PO TABS
1000.0000 mg | ORAL_TABLET | Freq: Four times a day (QID) | ORAL | Status: DC
  Administered 2024-08-04 – 2024-08-06 (×7): 1000 mg via ORAL
  Filled 2024-08-04 (×8): qty 2

## 2024-08-04 NOTE — Assessment & Plan Note (Addendum)
 Found to have incidental hemoglobin on UA, moderate hemoglobin and 6-10 RBCs.  Likely incidental, in the context of starting Lovenox  with no urinary symptoms. - Repeat UA outpatient

## 2024-08-04 NOTE — Progress Notes (Signed)
 Daily Progress Note Intern Pager: [phone]  Patient name: Ricky Humphrey Medical record number: [id number] Date of birth: [birth year] Age: 33 y.o. Gender: male  Primary Care Provider: Emilio Joesph DEL, PA-C Consultants: None Code Status: Full code   Pt Overview and Major Events to Date:  12/9-admitted  Medical Decision Making:  KHANH TANORI is a 33 year old with PMHx of epilepsy on Keppra  and carbamazepine  XR, and previous PE 2023 admitted for multiple acute pulmonary emboli.  Pain control gradually improving today, will continue treatment and monitor closely, hopeful that anticoagulation will continue to help improve analgesia. Assessment & Plan Pulmonary embolism (HCC) VSS improving, no more tachypnea.  Took 3 as needed Dilaudid 's over the last 24 hours.  Reports current pain regiment does bring pain to a tolerable level.  Encouraged to try Flexeril  prior to Dilaudid .  Some atelectasis seen on imaging - incentive spirometry, encourage patient to continue using as much as tolerated - Lovenox  90 mg subcu twice daily, given plan for at least 39-month treatment of Lovenox , will plan to transition to Lovenox  126 mg subcu daily - Outpatient neurology follow-up to adjust antiepileptic - Pain: - 2 Lidocaine  patches every 12 hours  - continue cyclobenzaprine  5 mg 3 times daily as needed - oxycodone  5 mg every 4 hours as needed moderate pain - Dilaudid  1 mg IV every 4 hours as needed severe pain - Acetaminophen  1000 mg every 6 hours - Urinalysis with 30 protein, pending follow-up microalbumin/creatinine ratio - Lab holiday Microscopic hematuria (Resolved: 08/04/2024) Found to have incidental hemoglobin on UA, moderate hemoglobin and 6-10 RBCs.  Likely incidental, in the context of starting Lovenox  with no urinary symptoms. - Repeat UA outpatient Right lower quadrant abdominal pain Much improved from yesterday, likely referred diaphragmatic pain from PE.  CTAP found no  acute intra-abdominal findings.  Tolerating more diet today, benign abdominal exam. - Continue monitoring for change in abdominal exam Chronic health problem Epilepsy: Continue Keppra  1500 mg twice daily and carbamazepine  XR 600 mg twice daily   FEN/GI: Regular diet PPx: Lovenox  80 mg twice daily Dispo:Home pending clinical improvement . Barriers include pain.   Subjective:  Saw patient at bedside, no acute events overnight.  Reports that he is feeling slightly more comfortable from yesterday.  Was able to sleep more over the last 24 hours than day prior.  Currently reports pain is 8 out of 10, still located right mid thoracic back and chest.  Pain is 6-7 out of 10 when on just the p.o. regiment.  Has been doing some incentive spirometry, has not been using much as it hurts to breathe deeply.  Objective: Temp:  [97.8 F (36.6 C)-99.3 F (37.4 C)] 98.5 F (36.9 C) (12/11 0748) Pulse Rate:  [71-86] 77 (12/11 0748) Resp:  [18-30] 18 (12/11 0748) BP: (118-139)/(60-85) 125/76 (12/11 0748) SpO2:  [96 %-100 %] 99 % (12/11 0748) Physical Exam: General: Well-appearing, resting comfortably in bed Cardiovascular: Regular rate, normal S1/S2 no murmurs rubs or gallops Respiratory: Clear to auscultation bilaterally, decreased air exchange, nontender to palpation Abdomen: Active bowel sounds, nondistended, nontender to light or deep palpation, no rebound or guarding Extremities: Nonedematous, nontender bilateral lower extremities  Laboratory: Most recent CBC Lab Results  Component Value Date   WBC 12.9 (H) 08/04/2024   HGB 13.7 08/04/2024   HCT 40.0 08/04/2024   MCV 97.3 08/04/2024   PLT 181 08/04/2024   Most recent BMP    Latest Ref Rng & Units 08/04/2024  4:34 AM  BMP  Glucose 70 - 99 mg/dL 89   BUN 6 - 20 mg/dL 11   Creatinine 9.38 - 1.24 mg/dL 9.12   Sodium 864 - 854 mmol/L 133   Potassium 3.5 - 5.1 mmol/L 4.0   Chloride 98 - 111 mmol/L 102   CO2 22 - 32 mmol/L 25   Calcium   8.9 - 10.3 mg/dL 8.3      Imaging/Diagnostic Tests: CTAP with contrast  IMPRESSION: 1. Increased right lower lobe opacity concerning for worsening pneumonia or atelectasis with minimal right pleural effusion. 2. Right middle lobe 4 mm subpleural pulmonary nodule; no routine follow-up imaging is recommended per Fleischner Society Guidelines for an incidental solid nodule 5 mm in a patient without specified high-risk features.  Lorrane Pac, MD 08/04/2024, 8:41 AM  PGY-1, [facility] FPTS Intern pager: [phone], text pages welcome Secure chat group [facility] Teaching Service

## 2024-08-04 NOTE — Assessment & Plan Note (Addendum)
 Much improved from yesterday, likely referred diaphragmatic pain from PE.  CTAP found no acute intra-abdominal findings.  Tolerating more diet today, benign abdominal exam. - Continue monitoring for change in abdominal exam

## 2024-08-04 NOTE — Assessment & Plan Note (Addendum)
 VSS improving, no more tachypnea.  Took 3 as needed Dilaudid 's over the last 24 hours.  Reports current pain regiment does bring pain to a tolerable level.  Encouraged to try Flexeril  prior to Dilaudid .  Some atelectasis seen on imaging - incentive spirometry, encourage patient to continue using as much as tolerated - Lovenox  90 mg subcu twice daily, given plan for at least 33-month treatment of Lovenox , will plan to transition to Lovenox  126 mg subcu daily - Outpatient neurology follow-up to adjust antiepileptic - Pain: - 2 Lidocaine  patches every 12 hours  - continue cyclobenzaprine  5 mg 3 times daily as needed - oxycodone  5 mg every 4 hours as needed moderate pain - Dilaudid  1 mg IV every 4 hours as needed severe pain - Acetaminophen  1000 mg every 6 hours - Urinalysis with 30 protein, pending follow-up microalbumin/creatinine ratio - Lab holiday

## 2024-08-04 NOTE — Assessment & Plan Note (Addendum)
 Epilepsy: Continue Keppra  1500 mg twice daily and carbamazepine  XR 600 mg twice daily

## 2024-08-04 NOTE — Plan of Care (Signed)
°  Problem: Education: Goal: Knowledge of General Education information will improve Description: Including pain rating scale, medication(s)/side effects and non-pharmacologic comfort measures Outcome: Progressing   Problem: Health Behavior/Discharge Planning: Goal: Ability to manage health-related needs will improve Outcome: Progressing   Problem: Clinical Measurements: Goal: Ability to maintain clinical measurements within normal limits will improve Outcome: Progressing Goal: Will remain free from infection Outcome: Progressing Goal: Diagnostic test results will improve Outcome: Progressing Goal: Respiratory complications will improve Outcome: Progressing Goal: Cardiovascular complication will be avoided Outcome: Progressing   Problem: Nutrition: Goal: Adequate nutrition will be maintained Outcome: Progressing   Problem: Coping: Goal: Level of anxiety will decrease Outcome: Progressing   Problem: Safety: Goal: Ability to remain free from injury will improve Outcome: Progressing   Problem: Pain Managment: Goal: General experience of comfort will improve and/or be controlled Outcome: Progressing   Problem: Skin Integrity: Goal: Risk for impaired skin integrity will decrease Outcome: Progressing

## 2024-08-05 DIAGNOSIS — I2694 Multiple subsegmental pulmonary emboli without acute cor pulmonale: Secondary | ICD-10-CM | POA: Diagnosis not present

## 2024-08-05 MED ORDER — SENNA 8.6 MG PO TABS
1.0000 | ORAL_TABLET | Freq: Two times a day (BID) | ORAL | Status: DC
  Administered 2024-08-05 – 2024-08-06 (×2): 8.6 mg via ORAL
  Filled 2024-08-05 (×2): qty 1

## 2024-08-05 MED ORDER — OXYCODONE HCL 5 MG PO TABS: 5.0000 mg | ORAL_TABLET | Freq: Four times a day (QID) | ORAL | Status: DC | PRN

## 2024-08-05 NOTE — Progress Notes (Signed)
 CSW spoke with pt regarding SDOH: housing, utilities.  Pt reports he currently is living in a mobile home that he owns on a lot that he rents.  Pt is currently employed at [employer], paying his mortgage and his utility bills.  Pt reports he had a previous hospitalization where he ended up out of work for a while, lost his housing, was homeless for a period of time.  He is concerned about this repeating.  Pt unclear how his current hospitalization will affect his employment at this time.  CSW discussed resources in the community for housing and financial assistance, list of resources provided to pt if needed in the future. Cathlyn Ferry, MSW, LCSW 12/12/20252:57 PM

## 2024-08-05 NOTE — Progress Notes (Signed)
° ° ° °  Daily Progress Note Intern Pager: [phone]  Patient name: Ricky Humphrey Medical record number: [id number] Date of birth: 02-14-1991 Age: 33 y.o. Gender: male  Primary Care Provider: Emilio Joesph DEL, PA-C Consultants: None Code Status: Full code    Pt Overview and Major Events to Date:  12/9-admitted   Medical Decision Making:   TOBI LEINWEBER is a 33 year old with PMHx of epilepsy on Keppra  and carbamazepine  XR, and previous PE 2023 admitted for multiple acute pulmonary emboli.  Pain gradually improving, though still moderate severity. Assessment & Plan Pulmonary embolism (HCC) Vital signs stable, received 3 as needed Dilaudid 's over last 24 hours.  States pain is gradually improving. - incentive spirometry - Continue Lovenox  80 mg twice daily, plan to transition to Lovenox  126 mg subcu daily tomorrow - Pain: - 2 Lidocaine  patches every 12 hours  - continue cyclobenzaprine  5 mg 3 times daily as needed - oxycodone  10 mg every 6 hours - Oxycodone  5 mg every 6 hours as needed breakthrough pain - Acetaminophen  1000 mg every 6 hours -MiraLAX  17 g twice daily, senna 8.6 mg twice daily - Lab holiday Right lower quadrant abdominal pain (Resolved: 08/05/2024) Abdomen much improved, pain was likely referred from PE - Continue monitoring for change in abdominal exam Chronic health problem Epilepsy: Continue Keppra  1500 mg twice daily and carbamazepine  XR 600 mg twice daily  FEN/GI: Regular diet PPx: Lovenox  80 mg twice daily Dispo:Home tomorrow. Barriers include needing IV pain meds.   Subjective:  Saw patient at bedside, reports that his pain has been gradually improving, now baseline pain of around 6 out of 10.  Has been ambulating a little more, still has not had a bowel movement since prior to admission.  Denies any nausea or vomiting, has been able to eat okay.  From a tree more more each day.  Objective: Temp:  [98.4 F (36.9 C)-98.9 F (37.2 C)] 98.4 F  (36.9 C) (12/12 0745) Pulse Rate:  [72-84] 80 (12/12 0745) Resp:  [18-19] 18 (12/12 0745) BP: (119-123)/(64-88) 121/64 (12/12 0745) SpO2:  [97 %-100 %] 100 % (12/12 0745) Physical Exam: General: Resting comfortably in bed, no acute distress Cardiovascular: Regular rate rhythm, no murmurs rubs or gallops Respiratory: Clear to auscultation bilaterally, no wheezes or crackles Abdomen: Active bowel sounds, nondistended, nontender to light or deep palpation Extremities: Nonedematous, nontender bilateral lower extremities Foot: Left foot 1 cm ulceration of distal metatarsal bony process, prominent pes planus  Laboratory: Most recent CBC Lab Results  Component Value Date   WBC 12.9 (H) 08/04/2024   HGB 13.7 08/04/2024   HCT 40.0 08/04/2024   MCV 97.3 08/04/2024   PLT 181 08/04/2024   Most recent BMP    Latest Ref Rng & Units 08/04/2024    4:34 AM  BMP  Glucose 70 - 99 mg/dL 89   BUN 6 - 20 mg/dL 11   Creatinine 9.38 - 1.24 mg/dL 9.12   Sodium 864 - 854 mmol/L 133   Potassium 3.5 - 5.1 mmol/L 4.0   Chloride 98 - 111 mmol/L 102   CO2 22 - 32 mmol/L 25   Calcium  8.9 - 10.3 mg/dL 8.3     Other pertinent labs none  Imaging/Diagnostic Tests: No new imaging  Lorrane Pac, MD 08/05/2024, 8:19 AM  PGY-1, [place] [facility] FPTS Intern pager: [phone], text pages welcome Secure chat group [facility] Teaching Service

## 2024-08-05 NOTE — Assessment & Plan Note (Addendum)
 Abdomen much improved, pain was likely referred from PE - Continue monitoring for change in abdominal exam

## 2024-08-05 NOTE — Plan of Care (Signed)

## 2024-08-05 NOTE — Assessment & Plan Note (Addendum)
 Epilepsy: Continue Keppra  1500 mg twice daily and carbamazepine  XR 600 mg twice daily

## 2024-08-05 NOTE — Assessment & Plan Note (Signed)
 He remains vitally stable and pain is managed with oral medications. Has not required any PRN opioids in the last 24 hours.  - continue incentive spirometry - Transition to Lovenox  126 mg subcu daily  - Pain: - 2 Lidocaine  patches every 12 hours  - continue cyclobenzaprine  5 mg 3 times daily as needed - oxycodone  10 mg every 6 hours - Oxycodone  5 mg every 6 hours as needed breakthrough pain - Acetaminophen  1000 mg every 6 hours - continue bowel regimen: patient refused MiraLAX  17 g twice daily, did take senna 8.6 mg twice daily - Lab holiday

## 2024-08-05 NOTE — Progress Notes (Signed)
° ° ° °  Daily Progress Note Intern Pager: [phone]  Patient name: Ricky Humphrey Medical record number: [id number] Date of birth: 01/31/1991 Age: 33 y.o. Gender: male  Primary Care Provider: Emilio Joesph DEL, PA-C Consultants: none Code Status: full  Pt Overview and Major Events to Date:  12/9-admitted   Assessment and Plan:  Ricky Humphrey is a 33 year old with PMHx of epilepsy and previous PE 2023 admitted for acute, unprovoked PE. *** Assessment & Plan Pulmonary embolism (HCC) ***he remains vitally stable and pain is managed with oral medications. Has not*** required any PRN opioids in the last 24 hours.  - continue incentive spirometry - Transition to Lovenox  126 mg subcu daily  - Pain: - 2 Lidocaine  patches every 12 hours  - continue cyclobenzaprine  5 mg 3 times daily as needed - oxycodone  10 mg every 6 hours - Oxycodone  5 mg every 6 hours as needed breakthrough pain - Acetaminophen  1000 mg every 6 hours - continue bowel regimen: patient refused MiraLAX  17 g twice daily, did take senna 8.6 mg twice daily - Lab holiday Chronic health problem Epilepsy: Continue Keppra  1500 mg twice daily and carbamazepine  XR 600 mg twice daily  FEN/GI: regular PPx: therapeutic lovenox  Dispo:Home pending clinical improvement .   Subjective:  ***  Objective: Temp:  [98.4 F (36.9 C)-99.4 F (37.4 C)] 99.4 F (37.4 C) (12/12 2019) Pulse Rate:  [72-84] 84 (12/12 2019) Resp:  [16-19] 16 (12/12 2019) BP: (119-130)/(64-76) 130/76 (12/12 2019) SpO2:  [96 %-100 %] 96 % (12/12 2019) Physical Exam: General: *** Cardiovascular: *** Respiratory: *** Abdomen: *** Extremities: ***  Laboratory: None  Imaging/Diagnostic Tests: None  Alena Morrison, Audreyana Huntsberry, MD 08/05/2024, 10:59 PM  PGY-1, [facility] FPTS Intern pager: [phone], text pages welcome Secure chat group [facility] Teaching Service

## 2024-08-05 NOTE — Assessment & Plan Note (Addendum)
 Vital signs stable, received 3 as needed Dilaudid 's over last 24 hours.  States pain is gradually improving. - incentive spirometry - Continue Lovenox  80 mg twice daily, plan to transition to Lovenox  126 mg subcu daily tomorrow - Pain: - 2 Lidocaine  patches every 12 hours  - continue cyclobenzaprine  5 mg 3 times daily as needed - oxycodone  10 mg every 6 hours - Oxycodone  5 mg every 6 hours as needed breakthrough pain - Acetaminophen  1000 mg every 6 hours -MiraLAX  17 g twice daily, senna 8.6 mg twice daily - Lab holiday

## 2024-08-05 NOTE — Plan of Care (Signed)

## 2024-08-05 NOTE — Discharge Instructions (Addendum)
 Thank you for letting us  care for you during your stay.  You were admitted to the [facility] Medicine Teaching Service.   You were admitted for an unprovoked pulmonary embolism. You were treated with a blood thinner during your admission. It is important that you continue to take lovenox  daily to treat and prevent further PE. - Inject Lovenox  80mg  twice a day to treat your blood clots and prevent new ones - We recommend follow up with hematology/oncology outpatient for further workup of now two pulmonary embolisms.   Additionally, please follow up with your neurologist to discuss your antiepileptic medications and medication options that do not interact with potential blood thinners that may be recommended by hematology.   Please follow up with your primary care physician within 1 week.   If your symptoms worsen or return, please return to the hospital.  Please let us  know if you have questions about your stay at [facility].

## 2024-08-05 NOTE — Assessment & Plan Note (Signed)
 Epilepsy: Continue Keppra  1500 mg twice daily and carbamazepine  XR 600 mg twice daily

## 2024-08-05 NOTE — Discharge Summary (Incomplete)
 Family Medicine Teaching [facility] Discharge Summary  Patient name: Ricky Humphrey Medical record number: [id number] Date of birth: May 03, 1991 Age: 33 y.o. Gender: male Date of Admission: 08/02/2024  Date of Discharge: 08/06/24 Admitting Physician: Twyla Nearing, MD  Primary Care Provider: Emilio Joesph DEL, PA-C Consultants: none   Indication for Hospitalization: unprovoked PE  Discharge Diagnoses/Problem List:  Principal Problem:   Pulmonary embolism [facility]) Active Problems:   Seizures [facility])   Brief Hospital Course:  Ricky Humphrey is a 33 y.o.male with a history of seizures and previous PE 2023 who was admitted to the family medicine teaching Service at [facility] for acute PE. His hospital course is detailed below:  Acute pulmonary embolisms Presented to ED with sudden onset pleuritic chest pain with SOB that started last night.  Initial workup found elevated D-dimer and CTA PE found multiple right middle/lower lobe segmental arteries. Determined to be unprovoked. Patient was started on Lovenox  for VTE treatment given complex medication interaction from carbamazepine . Multimodal pain management during the course of hospitalization. With previous history of PE in mother and with personal history of 2 PEs, will need close outpatient hematology follow-up for hypercoagulability evaluation and potentially lifelong anticoagulation. Patient discharged on lovenox  80 mg SQ BID.   Other chronic conditions were medically managed with home medications and formulary alternatives as necessary (epilepsy)  PCP Follow-up Recommendations: Ensure Heme/Onc referral for unprovoked PE.  Ensure pt is consistently taking lovenox  Ensure neurology follow-up for adjusting antiepileptics given complex medication interaction. Repeat UA outpatient for microcytic hematuria Outpatient podiatry referral for severe pes planus and plantar ulcer.   Results/Tests Pending at Time of Discharge:   Unresulted Labs (From admission, onward)    None       Disposition: home  Discharge Condition: stable  Discharge Exam:  Vitals:   08/06/24 0355 08/06/24 0812  BP: 124/73 130/79  Pulse: 63 73  Resp: 15 17  Temp: 98.4 F (36.9 C) 99 F (37.2 C)  SpO2: 97% 99%   General: well appearing male sleeping comfortably in bed Cardiovascular: RRR, no murmurs, 2+ radial pulse Respiratory: CTAB, no increased work of breathing on room air Abdomen: soft, nontender, no distension Extremities: calves symmetric, no pain to palpation, no peripheral edema  Significant Procedures: none  Significant Labs and Imaging:   CTA PE 1. Extensive acute pulmonary emboli within the right lung involving right middle and right lower lobe lobar/segmental arteries and the anterior segmental artery of the right upper lobe; no emboli identified in the left lung 2. No CT evidence of right heart strain; main pulmonary artery caliber within normal limits 3. Dependent bilateral lower lobe ground-glass and reticular opacities, likely atelectasis; nondependent lungs are clear   CT abm pelvis 1. Increased right lower lobe opacity concerning for worsening pneumonia or atelectasis with minimal right pleural effusion. 2. Right middle lobe 4 mm subpleural pulmonary nodule; no routine follow-up imaging is recommended per Fleischner Society Guidelines for an incidental solid nodule 5 mm in a patient without specified high-risk features.   Discharge Medications:  Allergies as of 08/06/2024       Reactions   Motrin  [ibuprofen ] Other (See Comments)   Unknown reaction   Tylenol  [acetaminophen ] Other (See Comments)   Seizures        Medication List     TAKE these medications    acetaminophen  500 MG tablet Commonly known as: TYLENOL  Take 2 tablets (1,000 mg total) by mouth every 6 (six) hours as needed.   cyclobenzaprine  5 MG  tablet Commonly known as: FLEXERIL  Take 1 tablet (5 mg total) by mouth 3  (three) times daily as needed for up to 5 days for muscle spasms (chest wall pain).   enoxaparin  80 MG/0.8ML injection Commonly known as: LOVENOX  Inject 0.8 mLs (80 mg total) into the skin 2 (two) times daily.   levETIRAcetam  750 MG tablet Commonly known as: KEPPRA  Take 2 tablets (1,500 mg total) by mouth 2 (two) times daily.   lidocaine  5 % Commonly known as: LIDODERM  Place 2 patches onto the skin daily. Remove & Discard patch within 12 hours or as directed by MD   oxyCODONE  5 MG immediate release tablet Commonly known as: Oxy IR/ROXICODONE  Take 1-2 tablets (5-10 mg total) by mouth every 6 (six) hours as needed for breakthrough pain.   senna 8.6 MG Tabs tablet Commonly known as: SENOKOT Take 1 tablet (8.6 mg total) by mouth 2 (two) times daily for 10 days.   TEGretol -XR 200 MG 12 hr tablet Generic drug: carbamazepine  Take 3 tablets (600 mg total) by mouth 2 (two) times daily.        Discharge Instructions: Please refer to Patient Instructions section of EMR for full details.  Patient was counseled important signs and symptoms that should prompt return to medical care, changes in medications, dietary instructions, activity restrictions, and follow up appointments.   Follow-Up Appointments:  Follow-up Information     Emilio Joesph DEL, PA-C. Schedule an appointment as soon as possible for a visit in 1 week(s).   Specialty: Physician Assistant Contact information: [address] [place] [address] [phone]                 Elio Art MD PGY-1, [facility] Family [facility]

## 2024-08-06 ENCOUNTER — Other Ambulatory Visit: Payer: Self-pay | Admitting: Family Medicine

## 2024-08-06 ENCOUNTER — Other Ambulatory Visit (HOSPITAL_COMMUNITY): Payer: Self-pay

## 2024-08-06 MED ORDER — ACETAMINOPHEN 500 MG PO TABS: 1000.0000 mg | ORAL_TABLET | Freq: Four times a day (QID) | ORAL | Status: AC | PRN

## 2024-08-06 MED ORDER — SENNA 8.6 MG PO TABS
1.0000 | ORAL_TABLET | Freq: Two times a day (BID) | ORAL | 0 refills | Status: AC
  Filled 2024-08-06: qty 20, 10d supply, fill #0

## 2024-08-06 MED ORDER — OXYCODONE HCL 5 MG PO TABS
5.0000 mg | ORAL_TABLET | Freq: Four times a day (QID) | ORAL | 0 refills | Status: DC | PRN
  Filled 2024-08-06: qty 30, 5d supply, fill #0

## 2024-08-06 MED ORDER — ENOXAPARIN SODIUM 80 MG/0.8ML IJ SOSY
80.0000 mg | PREFILLED_SYRINGE | Freq: Two times a day (BID) | INTRAMUSCULAR | Status: DC
  Administered 2024-08-06: 80 mg via SUBCUTANEOUS
  Filled 2024-08-06 (×2): qty 0.8

## 2024-08-06 MED ORDER — ENOXAPARIN SODIUM 120 MG/0.8ML IJ SOSY
120.0000 mg | PREFILLED_SYRINGE | Freq: Every day | INTRAMUSCULAR | Status: DC
  Filled 2024-08-06: qty 0.8

## 2024-08-06 MED ORDER — CYCLOBENZAPRINE HCL 5 MG PO TABS
5.0000 mg | ORAL_TABLET | Freq: Three times a day (TID) | ORAL | 0 refills | Status: AC | PRN
  Filled 2024-08-06: qty 15, 5d supply, fill #0

## 2024-08-06 MED ORDER — LIDOCAINE 5 % EX PTCH
2.0000 | MEDICATED_PATCH | CUTANEOUS | 0 refills | Status: AC
  Filled 2024-08-06: qty 30, 15d supply, fill #0

## 2024-08-06 MED ORDER — ENOXAPARIN SODIUM 80 MG/0.8ML IJ SOSY
80.0000 mg | PREFILLED_SYRINGE | Freq: Two times a day (BID) | INTRAMUSCULAR | 0 refills | Status: AC
  Filled 2024-08-06: qty 48, 30d supply, fill #0

## 2024-08-06 MED ORDER — OXYCODONE HCL 5 MG PO TABS
5.0000 mg | ORAL_TABLET | Freq: Four times a day (QID) | ORAL | 0 refills | Status: AC | PRN
  Filled 2024-08-06: qty 24, 5d supply, fill #0

## 2024-08-06 NOTE — Plan of Care (Signed)

## 2024-08-06 NOTE — Plan of Care (Signed)

## 2024-09-01 ENCOUNTER — Other Ambulatory Visit: Payer: Self-pay | Admitting: Diagnostic Neuroimaging

## 2024-09-01 ENCOUNTER — Other Ambulatory Visit (HOSPITAL_COMMUNITY): Payer: Self-pay

## 2024-09-01 NOTE — Telephone Encounter (Signed)
 Patient has an appointment for 09/05/24 for medication refills. Has not been seen since July of 2024

## 2024-09-01 NOTE — Patient Instructions (Signed)
 Below is our plan:  We will continue levetiractam 1500mg  and Tegretol  BRAND 600mg  twice daily .   Please make sure you are consistent with timing of seizure medication. I recommend annual visit with primary care provider (PCP) for complete physical and routine blood work. I recommend daily intake of vitamin D (400-800iu) and calcium  (800-1000mg ) for bone health. Discuss Dexa screening with PCP.   According to [place] law, you can not drive unless you are seizure / syncope free for at least 6 months and under physician's care.  Please maintain precautions. Do not participate in activities where a loss of awareness could harm you or someone else. No swimming alone, no tub bathing, no hot tubs, no driving, no operating motorized vehicles (cars, ATVs, motocycles, etc), lawnmowers, power tools or firearms. No standing at heights, such as rooftops, ladders or stairs. Avoid hot objects such as stoves, heaters, open fires. Wear a helmet when riding a bicycle, scooter, skateboard, etc. and avoid areas of traffic. Set your water heater to 120 degrees or less.  SUDEP is the sudden, unexpected death of someone with epilepsy, who was otherwise healthy. In SUDEP cases, no other cause of death is found when an autopsy is done. Each year, more than 1 in 1,000 people with epilepsy die from SUDEP. This is the leading cause of death in people with uncontrolled seizures. Until further answers are available, the best way to prevent SUDEP is to lower your risk by controlling seizures. Research has found that people with all types of epilepsy that experience convulsive seizures can be at risk.  Please make sure you are staying well hydrated. I recommend 50-60 ounces daily. Well balanced diet and regular exercise encouraged. Consistent sleep schedule with 6-8 hours recommended.   Please continue follow up with care team as directed.   Follow up with me or Dr Margaret in 1 year   You may receive a survey regarding today's  visit. I encourage you to leave honest feed back as I do use this information to improve patient care. Thank you for seeing me today!

## 2024-09-01 NOTE — Progress Notes (Unsigned)
 "   No chief complaint on file.   HISTORY OF PRESENT ILLNESS:  09/01/2024 ALL:  Ricky Humphrey is a 34 y.o. male here today for follow up for seizures. He was last seen by Dr Margaret 02/2023. He continues lev 1500mg  and Tegretol  BRAND 600mg  BID.    HISTORY (copied from Dr Chancy previous note)  UPDATE (03/09/23, VRP): Since last visit, doing well, except onset of headaches in May 2024. Daily, general HA. No nausea. Some sens to light. No seizures.    UPDATE (08/26/22, VRP): Since last visit, had breakthrough sz in Aug and Dec 2023 (possibly missed doses of meds). Then later in Dec 2023 had SOB, dx with right leg DVT and PE. Now on lovenox . Has family hx of blood clots in mother, maternal grandmother, maternal cousins, maternal aunt.    UPDATE (01/22/22, VRP): Since last visit, doing well since last year; no seizures. Recently dx'd with rotavirus, and having some nausea / vomiting.    UPDATE (01/23/21, VRP): Since last visit, doing well. Symptoms are stable. No alleviating or aggravating factors. Tolerating meds. Had 2-3 seizures in Sept 2021 (low CBZ level; may have run out of meds). Had COVID in winter 2021, but had mild sxs and now a good recovery.   REVIEW OF SYSTEMS: Out of a complete 14 system review of symptoms, the patient complains only of the following symptoms, and all other reviewed systems are negative.   ALLERGIES: Allergies[1]   HOME MEDICATIONS: Outpatient Medications Prior to Visit  Medication Sig Dispense Refill   acetaminophen  (TYLENOL ) 500 MG tablet Take 2 tablets (1,000 mg total) by mouth every 6 (six) hours as needed.     enoxaparin  (LOVENOX ) 80 MG/0.8ML injection Inject 0.8 mLs (80 mg total) into the skin 2 (two) times daily. 48 mL 0   levETIRAcetam  (KEPPRA ) 750 MG tablet Take 2 tablets (1,500 mg total) by mouth 2 (two) times daily. 360 tablet 0   lidocaine  (LIDODERM ) 5 % Place 2 patches onto the skin daily. Remove & Discard patch within 12 hours or as  directed by MD 30 patch 0   oxyCODONE  (OXY IR/ROXICODONE ) 5 MG immediate release tablet Take 1-2 tablets (5-10 mg total) by mouth every 6 (six) hours as needed for breakthrough pain. 24 tablet 0   TEGRETOL -XR 200 MG 12 hr tablet Take 3 tablets (600 mg total) by mouth 2 (two) times daily. 540 tablet 0   No facility-administered medications prior to visit.     PAST MEDICAL HISTORY: Past Medical History:  Diagnosis Date   Arachnoid cyst    s/p drainage by craniotomy   Cerebral palsy (HCC)    mild   Epilepsy (HCC)    Headache    Paroxysmal atrial flutter (HCC)    brief, post sz on 09/22/11   Seizure (HCC)    Seizures (HCC)    last June 2020     PAST SURGICAL HISTORY: Past Surgical History:  Procedure Laterality Date   acrnoid cyst     HAND SURGERY  07/2019   fracture   TONSILLECTOMY AND ADENOIDECTOMY       FAMILY HISTORY: Family History  Problem Relation Age of Onset   Migraines Mother    Diabetes Mother    Clotting disorder Mother    Asthma Brother      SOCIAL HISTORY: Social History   Socioeconomic History   Marital status: Married    Spouse name: Ricky Humphrey   Number of children: 1   Years of education: Automotive Engineer  Highest education level: Some college, no degree  Occupational History   Occupation: Event Organiser: [employer]  Tobacco Use   Smoking status: Never   Smokeless tobacco: Never  Vaping Use   Vaping status: Never Used  Substance and Sexual Activity   Alcohol use: No    Alcohol/week: 0.0 standard drinks of alcohol   Drug use: No   Sexual activity: Not on file  Other Topics Concern   Not on file  Social History Narrative   Patient lives with a roommate.   First child, daughter born May 2019   College at [employer].    Caffeine Use: 2-3 cups daily   Social Drivers of Health   Tobacco Use: Low Risk (08/22/2024)   Received from [facility]   Patient History    Smoking Tobacco Use: Never    Smokeless Tobacco Use: Never    Passive Exposure:  Never  Financial Resource Strain: Low Risk (08/10/2024)   Received from [facility]   Overall Financial Resource Strain (CARDIA)    How hard is it for you to pay for the very basics like food, housing, medical care, and heating?: Not hard at all  Food Insecurity: No Food Insecurity (08/10/2024)   Received from [facility]   Epic    Within the past 12 months, you worried that your food would run out before you got the money to buy more.: Never true    Within the past 12 months, the food you bought just didn't last and you didn't have money to get more.: Never true  Transportation Needs: No Transportation Needs (08/10/2024)   Received from [facility]    In the past 12 months, has lack of transportation kept you from medical appointments or from getting medications?: No    In the past 12 months, has lack of transportation kept you from meetings, work, or from getting things needed for daily living?: No  Physical Activity: Not on file  Stress: No Stress Concern Present (10/05/2023)   Received from [facility] - Occupational Stress Questionnaire    Feeling of Stress : Not at all  Social Connections: Not on file  Intimate Partner Violence: Not At Risk (08/03/2024)   Epic    Fear of Current or Ex-Partner: No    Emotionally Abused: No    Physically Abused: No    Sexually Abused: No  Depression (PHQ2-9): Not on file  Alcohol Screen: Not on file  Housing: Low Risk (08/10/2024)   Received from [facility]    In the last 12 months, was there a time when you were not able to pay the mortgage or rent on time?: No    In the past 12 months, how many times have you moved where you were living?: 0    At any time in the past 12 months, were you homeless or living in a shelter (including now)?: No  Recent Concern: Housing - High Risk (08/03/2024)   Epic    Unable to Pay for Housing in the Last Year: Yes    Number of Times Moved in  the Last Year: 2    Homeless in the Last Year: Yes  Utilities: Not At Risk (08/10/2024)   [facility]    In the past 12 months has the electric, gas, oil, or water company threatened to shut off services in your home?: No  Recent Concern:  Utilities - At Risk (08/03/2024)   Epic    Threatened with loss of utilities: Yes  Health Literacy: Not on file     PHYSICAL EXAM  There were no vitals filed for this visit. There is no height or weight on file to calculate BMI.  Generalized: Well developed, in no acute distress  Cardiology: normal rate and rhythm, no murmur auscultated  Respiratory: clear to auscultation bilaterally    Neurological examination  Mentation: Alert oriented to time, place, history taking. Follows all commands speech and language fluent Cranial nerve II-XII: Pupils were equal round reactive to light. Extraocular movements were full, visual field were full on confrontational test. Facial sensation and strength were normal. Uvula tongue midline. Head turning and shoulder shrug  were normal and symmetric. Motor: The motor testing reveals 5 over 5 strength of all 4 extremities. Good symmetric motor tone is noted throughout.  Sensory: Sensory testing is intact to soft touch on all 4 extremities. No evidence of extinction is noted.  Coordination: Cerebellar testing reveals good finger-nose-finger and heel-to-shin bilaterally.  Gait and station: Gait is normal. Tandem gait is normal. Romberg is negative. No drift is seen.  Reflexes: Deep tendon reflexes are symmetric and normal bilaterally.    DIAGNOSTIC DATA (LABS, IMAGING, TESTING) - I reviewed patient records, labs, notes, testing and imaging myself where available.  Lab Results  Component Value Date   WBC 12.9 (H) 08/04/2024   HGB 13.7 08/04/2024   HCT 40.0 08/04/2024   MCV 97.3 08/04/2024   PLT 181 08/04/2024      Component Value Date/Time   NA 133 (L) 08/04/2024 0434   NA 140  01/23/2021 1350   K 4.0 08/04/2024 0434   CL 102 08/04/2024 0434   CO2 25 08/04/2024 0434   GLUCOSE 89 08/04/2024 0434   BUN 11 08/04/2024 0434   BUN 11 01/23/2021 1350   CREATININE 0.87 08/04/2024 0434   CALCIUM  8.3 (L) 08/04/2024 0434   PROT 6.4 (L) 08/03/2024 0115   PROT 6.4 01/23/2021 1350   ALBUMIN 3.2 (L) 08/03/2024 0115   ALBUMIN 4.3 01/23/2021 1350   AST 22 08/03/2024 0115   ALT 23 08/03/2024 0115   ALKPHOS 53 08/03/2024 0115   BILITOT 1.1 08/03/2024 0115   BILITOT 0.3 01/23/2021 1350   GFRNONAA >60 08/04/2024 0434   GFRAA >60 05/05/2020 0830   No results found for: CHOL, HDL, LDLCALC, LDLDIRECT, TRIG, CHOLHDL Lab Results  Component Value Date   HGBA1C 4.8 06/14/2016   No results found for: [id number] Lab Results  Component Value Date   TSH 0.618 06/13/2016        No data to display               No data to display           ASSESSMENT AND PLAN  34 y.o. year old male  has a past medical history of Arachnoid cyst, Cerebral palsy (HCC), Epilepsy (HCC), Headache, Paroxysmal atrial flutter (HCC), Seizure (HCC), and Seizures (HCC). here with    No diagnosis found.  Ricky Humphrey ***.  Healthy lifestyle habits encouraged. *** will follow up with PCP as directed. *** will return to see me in ***, sooner if needed. *** verbalizes understanding and agreement with this plan.   No orders of the defined types were placed in this encounter.    No orders of the defined types were placed in this encounter.    Greig Forbes, MSN, FNP-C 09/01/2024, 9:58 PM  [facility] [address] [place], [place] [address] [phone]     [1]  Allergies Allergen Reactions   Motrin  [Ibuprofen ] Other (See Comments)    Unknown reaction   Tylenol  [Acetaminophen ] Other (See Comments)    Seizures   "

## 2024-09-05 ENCOUNTER — Ambulatory Visit: Admitting: Family Medicine

## 2024-09-05 ENCOUNTER — Encounter: Payer: Self-pay | Admitting: Family Medicine

## 2024-09-05 VITALS — BP 139/92 | HR 100 | Ht 75.0 in | Wt 194.5 lb

## 2024-09-05 DIAGNOSIS — G40009 Localization-related (focal) (partial) idiopathic epilepsy and epileptic syndromes with seizures of localized onset, not intractable, without status epilepticus: Secondary | ICD-10-CM

## 2024-09-05 DIAGNOSIS — E785 Hyperlipidemia, unspecified: Secondary | ICD-10-CM | POA: Insufficient documentation

## 2024-09-05 MED ORDER — LEVETIRACETAM 750 MG PO TABS: 1500.0000 mg | ORAL_TABLET | Freq: Two times a day (BID) | ORAL | 3 refills | Status: DC

## 2024-09-05 MED ORDER — TEGRETOL-XR 200 MG PO TB12: 600.0000 mg | ORAL_TABLET | Freq: Two times a day (BID) | ORAL | 3 refills | Status: DC

## 2024-09-06 LAB — COMPREHENSIVE METABOLIC PANEL WITH GFR
ALT: 88 IU/L — ABNORMAL HIGH (ref 0–44)
AST: 50 IU/L — ABNORMAL HIGH (ref 0–40)
Albumin: 3.7 g/dL — ABNORMAL LOW (ref 4.1–5.1)
Alkaline Phosphatase: 82 IU/L (ref 47–123)
BUN/Creatinine Ratio: 17 (ref 9–20)
BUN: 16 mg/dL (ref 6–20)
Bilirubin Total: <0.2 mg/dL (ref 0.0–1.2)
CO2: 23 mmol/L (ref 20–29)
Calcium: 9.1 mg/dL (ref 8.7–10.2)
Chloride: 105 mmol/L (ref 96–106)
Creatinine, Ser: 0.96 mg/dL (ref 0.76–1.27)
Globulin, Total: 2.9 g/dL (ref 1.5–4.5)
Glucose: 87 mg/dL (ref 70–99)
Potassium: 4.6 mmol/L (ref 3.5–5.2)
Sodium: 141 mmol/L (ref 134–144)
Total Protein: 6.6 g/dL (ref 6.0–8.5)
eGFR: 108 mL/min/1.73

## 2024-09-06 LAB — CBC WITH DIFFERENTIAL/PLATELET
Basophils Absolute: 0.1 x10E3/uL (ref 0.0–0.2)
Basos: 1 %
EOS (ABSOLUTE): 0.2 x10E3/uL (ref 0.0–0.4)
Eos: 4 %
Hematocrit: 43.1 % (ref 37.5–51.0)
Hemoglobin: 14.3 g/dL (ref 13.0–17.7)
Immature Grans (Abs): 0.1 x10E3/uL (ref 0.0–0.1)
Immature Granulocytes: 1 %
Lymphocytes Absolute: 1.7 x10E3/uL (ref 0.7–3.1)
Lymphs: 27 %
MCH: 32.6 pg (ref 26.6–33.0)
MCHC: 33.2 g/dL (ref 31.5–35.7)
MCV: 98 fL — ABNORMAL HIGH (ref 79–97)
Monocytes Absolute: 0.6 x10E3/uL (ref 0.1–0.9)
Monocytes: 10 %
Neutrophils Absolute: 3.7 x10E3/uL (ref 1.4–7.0)
Neutrophils: 57 %
Platelets: 281 x10E3/uL (ref 150–450)
RBC: 4.38 x10E6/uL (ref 4.14–5.80)
RDW: 12.8 % (ref 11.6–15.4)
WBC: 6.4 x10E3/uL (ref 3.4–10.8)

## 2024-09-06 LAB — CARBAMAZEPINE LEVEL, TOTAL: Carbamazepine (Tegretol), S: 6.8 ug/mL (ref 4.0–12.0)

## 2024-09-06 LAB — LEVETIRACETAM LEVEL: Levetiracetam Lvl: 9.8 ug/mL — ABNORMAL LOW (ref 10.0–40.0)

## 2024-09-09 ENCOUNTER — Other Ambulatory Visit: Payer: Self-pay | Admitting: Oncology

## 2024-09-09 ENCOUNTER — Other Ambulatory Visit: Payer: Self-pay

## 2024-09-09 ENCOUNTER — Other Ambulatory Visit (HOSPITAL_COMMUNITY): Payer: Self-pay

## 2024-09-09 ENCOUNTER — Ambulatory Visit: Payer: Self-pay | Admitting: Family Medicine

## 2024-09-09 MED ORDER — ENOXAPARIN SODIUM 120 MG/0.8ML IJ SOSY
120.0000 mg | PREFILLED_SYRINGE | Freq: Every day | INTRAMUSCULAR | 5 refills | Status: AC
  Filled 2024-09-09: qty 24, 30d supply, fill #0

## 2024-09-09 NOTE — Telephone Encounter (Signed)
 Refills for the Tegretol  and Keppra  need to be sent to his neurology provider.  I work in hematology and saw him for a DVT at [facility].

## 2024-09-13 ENCOUNTER — Other Ambulatory Visit (HOSPITAL_COMMUNITY): Payer: Self-pay

## 2024-09-13 ENCOUNTER — Other Ambulatory Visit: Payer: Self-pay | Admitting: Diagnostic Neuroimaging

## 2024-09-13 ENCOUNTER — Telehealth (HOSPITAL_COMMUNITY): Payer: Self-pay

## 2024-09-13 ENCOUNTER — Other Ambulatory Visit: Payer: Self-pay | Admitting: Oncology

## 2024-09-13 MED ORDER — LEVETIRACETAM 750 MG PO TABS
1500.0000 mg | ORAL_TABLET | Freq: Two times a day (BID) | ORAL | 3 refills | Status: AC
  Filled 2024-09-13: qty 120, 30d supply, fill #0

## 2024-09-13 MED ORDER — TEGRETOL-XR 200 MG PO TB12
600.0000 mg | ORAL_TABLET | Freq: Two times a day (BID) | ORAL | 3 refills | Status: DC
  Filled 2024-09-13: qty 180, 30d supply, fill #0

## 2024-09-13 MED ORDER — CARBAMAZEPINE ER 200 MG PO TB12
600.0000 mg | ORAL_TABLET | Freq: Two times a day (BID) | ORAL | 3 refills | Status: AC
  Filled 2024-09-13: qty 180, 30d supply, fill #0

## 2024-09-13 NOTE — Addendum Note (Signed)
 Addended by: Carole Doner-JACKSON, Saman Umstead L on: 09/13/2024 04:53 PM   Modules accepted: Orders

## 2024-09-13 NOTE — Telephone Encounter (Signed)
 Last office visit : 09/05/24 Next office visit :  09/25/25 Per last office note :  labs are stable from a seizure standpoint. Your liver enzymes are mildly elevated. Please continue to follow this closely through your PCP. Continue current treatment plan.    Needed medications switched from [facility] to [facility]

## 2024-09-13 NOTE — Telephone Encounter (Signed)
 Pt called  to follow up about medication refill  Informed that medication refill is being work   Cyclobenzaprine  HCl     levETIRAcetam  (KEPPRA ) 750 MG tablet   Pt medication  is to be to Pharmacy at [facility]

## 2024-09-13 NOTE — Telephone Encounter (Signed)
 Kelsey from [facility] called to follow up about Pt medication refill . Informed  that  I just spoke to PT . Larraine stated that  Pt is out of medication  as of today  and needs refill.I also informed that  we did receive  request MD is working on request

## 2024-09-13 NOTE — Telephone Encounter (Signed)
 Patient has been notified that medications have been sent into [facility].

## 2024-09-14 ENCOUNTER — Other Ambulatory Visit: Payer: Self-pay

## 2024-09-14 ENCOUNTER — Telehealth: Payer: Self-pay | Admitting: Family Medicine

## 2024-09-14 ENCOUNTER — Other Ambulatory Visit (HOSPITAL_COMMUNITY): Payer: Self-pay

## 2024-09-14 ENCOUNTER — Other Ambulatory Visit (HOSPITAL_BASED_OUTPATIENT_CLINIC_OR_DEPARTMENT_OTHER): Payer: Self-pay

## 2024-09-14 MED ORDER — CARBAMAZEPINE ER 200 MG PO TB12
600.0000 mg | ORAL_TABLET | Freq: Two times a day (BID) | ORAL | 3 refills | Status: AC
  Filled 2024-09-14: qty 204, 34d supply, fill #0

## 2024-09-14 NOTE — Addendum Note (Signed)
 Addended by: Cheyla Duchemin-JACKSON, Arline Ketter L on: 09/14/2024 02:26 PM   Modules accepted: Orders

## 2024-09-14 NOTE — Telephone Encounter (Signed)
 [facility]/ [place]) Patient requesting the generic brand for carbamazepine  (TEGRETOL  XR) 200 MG 12 hr tablet. Need permission from neurologist to change to generic

## 2024-09-14 NOTE — Telephone Encounter (Signed)
 Patient spoke to pharmacy and requested generic brand change. I called the pharmacy and carbamazepine  (TEGRETOL -XR) 200 MG 12 hr tablet has been switched to generic due to patient's insurance. It will cost the patient $4. 90 days supply is not covered only 34 day supply of medication is covered by his insurance. I sent a message to Dr. Penumalli about medication change.

## 2024-09-15 ENCOUNTER — Other Ambulatory Visit (HOSPITAL_COMMUNITY): Payer: Self-pay

## 2024-09-15 NOTE — Telephone Encounter (Signed)
 duplicate

## 2024-09-16 ENCOUNTER — Other Ambulatory Visit (HOSPITAL_COMMUNITY): Payer: Self-pay

## 2024-09-16 NOTE — Telephone Encounter (Signed)
 Rx changed to generic on 1-21

## 2025-09-25 ENCOUNTER — Ambulatory Visit: Admitting: Family Medicine
